# Patient Record
Sex: Male | Born: 1945 | Race: White | Hispanic: No | Marital: Married | Smoking: Never smoker
Health system: Southern US, Community
[De-identification: ages and names within clinical notes are randomized; demographics above are authoritative.]

## PROBLEM LIST (undated history)

## (undated) DIAGNOSIS — E785 Hyperlipidemia, unspecified: Secondary | ICD-10-CM

## (undated) DIAGNOSIS — I2510 Atherosclerotic heart disease of native coronary artery without angina pectoris: Secondary | ICD-10-CM

## (undated) DIAGNOSIS — Z4502 Encounter for adjustment and management of automatic implantable cardiac defibrillator: Secondary | ICD-10-CM

## (undated) DIAGNOSIS — M1990 Unspecified osteoarthritis, unspecified site: Secondary | ICD-10-CM

## (undated) DIAGNOSIS — I255 Ischemic cardiomyopathy: Secondary | ICD-10-CM

## (undated) DIAGNOSIS — S93146A Subluxation of metatarsophalangeal joint of unspecified lesser toe(s), initial encounter: Secondary | ICD-10-CM

## (undated) DIAGNOSIS — S93136A Subluxation of interphalangeal joint of unspecified lesser toe(s), initial encounter: Secondary | ICD-10-CM

## (undated) DIAGNOSIS — I119 Hypertensive heart disease without heart failure: Secondary | ICD-10-CM

## (undated) DIAGNOSIS — I5022 Chronic systolic (congestive) heart failure: Secondary | ICD-10-CM

## (undated) HISTORY — DX: Subluxation of metatarsophalangeal joint of unspecified lesser toe(s), initial encounter: S93.146A

## (undated) HISTORY — DX: Unspecified osteoarthritis, unspecified site: M19.90

## (undated) HISTORY — DX: Encounter for adjustment and management of automatic implantable cardiac defibrillator: Z45.02

## (undated) HISTORY — DX: Hyperlipidemia, unspecified: E78.5

## (undated) HISTORY — PX: TOTAL SHOULDER REPLACEMENT: SUR1217

## (undated) HISTORY — DX: Subluxation of interphalangeal joint of unspecified lesser toe(s), initial encounter: S93.136A

## (undated) HISTORY — PX: TOTAL KNEE ARTHROPLASTY: SHX125

## (undated) HISTORY — DX: Atherosclerotic heart disease of native coronary artery without angina pectoris: I25.10

## (undated) HISTORY — DX: Ischemic cardiomyopathy: I25.5

## (undated) HISTORY — PX: CORONARY ARTERY BYPASS GRAFT: SHX141

## (undated) HISTORY — DX: Chronic systolic (congestive) heart failure: I50.22

## (undated) HISTORY — DX: Hypertensive heart disease without heart failure: I11.9

---

## 2010-08-06 DIAGNOSIS — N390 Urinary tract infection, site not specified: Secondary | ICD-10-CM | POA: Insufficient documentation

## 2010-08-06 DIAGNOSIS — N529 Male erectile dysfunction, unspecified: Secondary | ICD-10-CM | POA: Insufficient documentation

## 2011-03-17 DIAGNOSIS — Z95810 Presence of automatic (implantable) cardiac defibrillator: Secondary | ICD-10-CM | POA: Insufficient documentation

## 2011-03-17 DIAGNOSIS — G4733 Obstructive sleep apnea (adult) (pediatric): Secondary | ICD-10-CM | POA: Insufficient documentation

## 2011-04-07 DIAGNOSIS — M25569 Pain in unspecified knee: Secondary | ICD-10-CM | POA: Insufficient documentation

## 2011-10-19 DIAGNOSIS — H2510 Age-related nuclear cataract, unspecified eye: Secondary | ICD-10-CM | POA: Diagnosis not present

## 2011-10-19 DIAGNOSIS — H04129 Dry eye syndrome of unspecified lacrimal gland: Secondary | ICD-10-CM | POA: Diagnosis not present

## 2011-10-19 DIAGNOSIS — H35039 Hypertensive retinopathy, unspecified eye: Secondary | ICD-10-CM | POA: Diagnosis not present

## 2011-10-19 DIAGNOSIS — D3130 Benign neoplasm of unspecified choroid: Secondary | ICD-10-CM | POA: Diagnosis not present

## 2011-11-20 DIAGNOSIS — Z0189 Encounter for other specified special examinations: Secondary | ICD-10-CM | POA: Diagnosis not present

## 2011-11-20 DIAGNOSIS — I2510 Atherosclerotic heart disease of native coronary artery without angina pectoris: Secondary | ICD-10-CM | POA: Diagnosis not present

## 2012-01-19 DIAGNOSIS — I472 Ventricular tachycardia: Secondary | ICD-10-CM | POA: Diagnosis not present

## 2012-01-19 DIAGNOSIS — I4729 Other ventricular tachycardia: Secondary | ICD-10-CM | POA: Diagnosis not present

## 2012-04-26 DIAGNOSIS — I509 Heart failure, unspecified: Secondary | ICD-10-CM | POA: Diagnosis not present

## 2012-05-17 DIAGNOSIS — Z86018 Personal history of other benign neoplasm: Secondary | ICD-10-CM | POA: Insufficient documentation

## 2012-05-17 DIAGNOSIS — L723 Sebaceous cyst: Secondary | ICD-10-CM | POA: Diagnosis not present

## 2012-05-17 DIAGNOSIS — Z872 Personal history of diseases of the skin and subcutaneous tissue: Secondary | ICD-10-CM | POA: Diagnosis not present

## 2012-05-17 DIAGNOSIS — L821 Other seborrheic keratosis: Secondary | ICD-10-CM | POA: Diagnosis not present

## 2012-05-17 DIAGNOSIS — L578 Other skin changes due to chronic exposure to nonionizing radiation: Secondary | ICD-10-CM | POA: Diagnosis not present

## 2012-06-14 DIAGNOSIS — I2510 Atherosclerotic heart disease of native coronary artery without angina pectoris: Secondary | ICD-10-CM | POA: Diagnosis not present

## 2012-06-14 DIAGNOSIS — N400 Enlarged prostate without lower urinary tract symptoms: Secondary | ICD-10-CM | POA: Diagnosis not present

## 2012-06-14 DIAGNOSIS — I10 Essential (primary) hypertension: Secondary | ICD-10-CM | POA: Diagnosis not present

## 2012-06-14 DIAGNOSIS — E785 Hyperlipidemia, unspecified: Secondary | ICD-10-CM | POA: Diagnosis not present

## 2012-07-27 DIAGNOSIS — I4729 Other ventricular tachycardia: Secondary | ICD-10-CM | POA: Diagnosis not present

## 2012-07-27 DIAGNOSIS — I472 Ventricular tachycardia: Secondary | ICD-10-CM | POA: Diagnosis not present

## 2012-09-19 DIAGNOSIS — Z0189 Encounter for other specified special examinations: Secondary | ICD-10-CM | POA: Diagnosis not present

## 2012-09-19 DIAGNOSIS — E7800 Pure hypercholesterolemia, unspecified: Secondary | ICD-10-CM | POA: Diagnosis not present

## 2012-09-22 DIAGNOSIS — I428 Other cardiomyopathies: Secondary | ICD-10-CM | POA: Diagnosis not present

## 2012-09-22 DIAGNOSIS — I2589 Other forms of chronic ischemic heart disease: Secondary | ICD-10-CM | POA: Diagnosis not present

## 2012-09-22 DIAGNOSIS — I2510 Atherosclerotic heart disease of native coronary artery without angina pectoris: Secondary | ICD-10-CM | POA: Diagnosis not present

## 2012-09-22 DIAGNOSIS — I4729 Other ventricular tachycardia: Secondary | ICD-10-CM | POA: Diagnosis not present

## 2012-09-22 DIAGNOSIS — I472 Ventricular tachycardia: Secondary | ICD-10-CM | POA: Diagnosis not present

## 2012-11-01 DIAGNOSIS — I4729 Other ventricular tachycardia: Secondary | ICD-10-CM | POA: Diagnosis not present

## 2012-11-01 DIAGNOSIS — I472 Ventricular tachycardia: Secondary | ICD-10-CM | POA: Diagnosis not present

## 2012-11-03 DIAGNOSIS — Z23 Encounter for immunization: Secondary | ICD-10-CM | POA: Diagnosis not present

## 2012-12-08 DIAGNOSIS — I2510 Atherosclerotic heart disease of native coronary artery without angina pectoris: Secondary | ICD-10-CM | POA: Diagnosis not present

## 2012-12-08 DIAGNOSIS — E7800 Pure hypercholesterolemia, unspecified: Secondary | ICD-10-CM | POA: Diagnosis not present

## 2012-12-08 DIAGNOSIS — L089 Local infection of the skin and subcutaneous tissue, unspecified: Secondary | ICD-10-CM | POA: Insufficient documentation

## 2012-12-08 DIAGNOSIS — G4733 Obstructive sleep apnea (adult) (pediatric): Secondary | ICD-10-CM | POA: Diagnosis not present

## 2012-12-16 DIAGNOSIS — K612 Anorectal abscess: Secondary | ICD-10-CM | POA: Diagnosis not present

## 2012-12-26 DIAGNOSIS — I2510 Atherosclerotic heart disease of native coronary artery without angina pectoris: Secondary | ICD-10-CM | POA: Diagnosis not present

## 2012-12-26 DIAGNOSIS — M76829 Posterior tibial tendinitis, unspecified leg: Secondary | ICD-10-CM | POA: Diagnosis not present

## 2012-12-26 DIAGNOSIS — E7800 Pure hypercholesterolemia, unspecified: Secondary | ICD-10-CM | POA: Diagnosis not present

## 2012-12-26 DIAGNOSIS — M25579 Pain in unspecified ankle and joints of unspecified foot: Secondary | ICD-10-CM | POA: Diagnosis not present

## 2012-12-26 DIAGNOSIS — G4733 Obstructive sleep apnea (adult) (pediatric): Secondary | ICD-10-CM | POA: Diagnosis not present

## 2013-01-06 DIAGNOSIS — R6889 Other general symptoms and signs: Secondary | ICD-10-CM | POA: Diagnosis not present

## 2013-01-06 DIAGNOSIS — R822 Biliuria: Secondary | ICD-10-CM | POA: Diagnosis not present

## 2013-01-10 DIAGNOSIS — I2510 Atherosclerotic heart disease of native coronary artery without angina pectoris: Secondary | ICD-10-CM | POA: Diagnosis not present

## 2013-01-10 DIAGNOSIS — M76829 Posterior tibial tendinitis, unspecified leg: Secondary | ICD-10-CM | POA: Insufficient documentation

## 2013-01-10 DIAGNOSIS — G4730 Sleep apnea, unspecified: Secondary | ICD-10-CM | POA: Diagnosis not present

## 2013-01-10 DIAGNOSIS — M6789 Other specified disorders of synovium and tendon, multiple sites: Secondary | ICD-10-CM | POA: Diagnosis not present

## 2013-01-12 DIAGNOSIS — M2010 Hallux valgus (acquired), unspecified foot: Secondary | ICD-10-CM | POA: Diagnosis not present

## 2013-01-12 DIAGNOSIS — M25579 Pain in unspecified ankle and joints of unspecified foot: Secondary | ICD-10-CM | POA: Diagnosis not present

## 2013-01-12 DIAGNOSIS — M19079 Primary osteoarthritis, unspecified ankle and foot: Secondary | ICD-10-CM | POA: Diagnosis not present

## 2013-01-27 DIAGNOSIS — I4729 Other ventricular tachycardia: Secondary | ICD-10-CM | POA: Diagnosis not present

## 2013-01-27 DIAGNOSIS — I509 Heart failure, unspecified: Secondary | ICD-10-CM | POA: Diagnosis not present

## 2013-01-27 DIAGNOSIS — I472 Ventricular tachycardia: Secondary | ICD-10-CM | POA: Diagnosis not present

## 2013-02-06 DIAGNOSIS — I472 Ventricular tachycardia: Secondary | ICD-10-CM | POA: Diagnosis not present

## 2013-02-06 DIAGNOSIS — I4729 Other ventricular tachycardia: Secondary | ICD-10-CM | POA: Diagnosis not present

## 2013-02-22 DIAGNOSIS — N390 Urinary tract infection, site not specified: Secondary | ICD-10-CM | POA: Diagnosis not present

## 2013-05-09 DIAGNOSIS — Z872 Personal history of diseases of the skin and subcutaneous tissue: Secondary | ICD-10-CM | POA: Diagnosis not present

## 2013-05-09 DIAGNOSIS — L723 Sebaceous cyst: Secondary | ICD-10-CM | POA: Diagnosis not present

## 2013-05-09 DIAGNOSIS — Z808 Family history of malignant neoplasm of other organs or systems: Secondary | ICD-10-CM | POA: Diagnosis not present

## 2013-05-09 DIAGNOSIS — L821 Other seborrheic keratosis: Secondary | ICD-10-CM | POA: Diagnosis not present

## 2013-05-22 DIAGNOSIS — I4729 Other ventricular tachycardia: Secondary | ICD-10-CM | POA: Diagnosis not present

## 2013-05-22 DIAGNOSIS — R1310 Dysphagia, unspecified: Secondary | ICD-10-CM | POA: Diagnosis not present

## 2013-05-22 DIAGNOSIS — I472 Ventricular tachycardia: Secondary | ICD-10-CM | POA: Diagnosis not present

## 2013-05-30 DIAGNOSIS — R1310 Dysphagia, unspecified: Secondary | ICD-10-CM | POA: Diagnosis not present

## 2013-06-14 DIAGNOSIS — K2100 Gastro-esophageal reflux disease with esophagitis, without bleeding: Secondary | ICD-10-CM | POA: Diagnosis not present

## 2013-06-14 DIAGNOSIS — K2941 Chronic atrophic gastritis with bleeding: Secondary | ICD-10-CM | POA: Diagnosis not present

## 2013-06-14 DIAGNOSIS — K2960 Other gastritis without bleeding: Secondary | ICD-10-CM | POA: Diagnosis not present

## 2013-06-14 DIAGNOSIS — K222 Esophageal obstruction: Secondary | ICD-10-CM | POA: Diagnosis not present

## 2013-06-14 DIAGNOSIS — K209 Esophagitis, unspecified without bleeding: Secondary | ICD-10-CM | POA: Diagnosis not present

## 2013-06-14 DIAGNOSIS — K449 Diaphragmatic hernia without obstruction or gangrene: Secondary | ICD-10-CM | POA: Diagnosis not present

## 2013-06-21 DIAGNOSIS — M25519 Pain in unspecified shoulder: Secondary | ICD-10-CM | POA: Diagnosis not present

## 2013-06-21 DIAGNOSIS — M19019 Primary osteoarthritis, unspecified shoulder: Secondary | ICD-10-CM | POA: Diagnosis not present

## 2013-06-21 DIAGNOSIS — M25419 Effusion, unspecified shoulder: Secondary | ICD-10-CM | POA: Diagnosis not present

## 2013-06-21 DIAGNOSIS — M25569 Pain in unspecified knee: Secondary | ICD-10-CM | POA: Diagnosis not present

## 2013-07-24 DIAGNOSIS — W57XXXA Bitten or stung by nonvenomous insect and other nonvenomous arthropods, initial encounter: Secondary | ICD-10-CM | POA: Diagnosis not present

## 2013-07-24 DIAGNOSIS — T148 Other injury of unspecified body region: Secondary | ICD-10-CM | POA: Diagnosis not present

## 2013-08-30 DIAGNOSIS — I472 Ventricular tachycardia: Secondary | ICD-10-CM | POA: Diagnosis not present

## 2013-08-30 DIAGNOSIS — I4729 Other ventricular tachycardia: Secondary | ICD-10-CM | POA: Diagnosis not present

## 2013-09-11 DIAGNOSIS — M25519 Pain in unspecified shoulder: Secondary | ICD-10-CM | POA: Diagnosis not present

## 2013-09-11 DIAGNOSIS — Z01818 Encounter for other preprocedural examination: Secondary | ICD-10-CM | POA: Diagnosis not present

## 2013-09-11 DIAGNOSIS — Z01810 Encounter for preprocedural cardiovascular examination: Secondary | ICD-10-CM | POA: Diagnosis not present

## 2013-09-11 DIAGNOSIS — M19019 Primary osteoarthritis, unspecified shoulder: Secondary | ICD-10-CM | POA: Diagnosis not present

## 2013-09-11 DIAGNOSIS — I2510 Atherosclerotic heart disease of native coronary artery without angina pectoris: Secondary | ICD-10-CM | POA: Diagnosis not present

## 2013-09-12 DIAGNOSIS — I428 Other cardiomyopathies: Secondary | ICD-10-CM | POA: Diagnosis not present

## 2013-09-12 DIAGNOSIS — Z96619 Presence of unspecified artificial shoulder joint: Secondary | ICD-10-CM | POA: Diagnosis not present

## 2013-09-12 DIAGNOSIS — I2510 Atherosclerotic heart disease of native coronary artery without angina pectoris: Secondary | ICD-10-CM | POA: Diagnosis not present

## 2013-09-12 DIAGNOSIS — Z01810 Encounter for preprocedural cardiovascular examination: Secondary | ICD-10-CM | POA: Diagnosis not present

## 2013-09-14 DIAGNOSIS — E7800 Pure hypercholesterolemia, unspecified: Secondary | ICD-10-CM | POA: Diagnosis not present

## 2013-09-15 DIAGNOSIS — Z01818 Encounter for other preprocedural examination: Secondary | ICD-10-CM | POA: Insufficient documentation

## 2013-09-15 DIAGNOSIS — E785 Hyperlipidemia, unspecified: Secondary | ICD-10-CM | POA: Diagnosis not present

## 2013-09-15 DIAGNOSIS — Z951 Presence of aortocoronary bypass graft: Secondary | ICD-10-CM | POA: Diagnosis not present

## 2013-09-15 DIAGNOSIS — I2510 Atherosclerotic heart disease of native coronary artery without angina pectoris: Secondary | ICD-10-CM | POA: Diagnosis not present

## 2013-09-15 DIAGNOSIS — Z01810 Encounter for preprocedural cardiovascular examination: Secondary | ICD-10-CM | POA: Diagnosis not present

## 2013-09-15 DIAGNOSIS — I428 Other cardiomyopathies: Secondary | ICD-10-CM | POA: Diagnosis not present

## 2013-09-15 DIAGNOSIS — Z95810 Presence of automatic (implantable) cardiac defibrillator: Secondary | ICD-10-CM | POA: Diagnosis not present

## 2013-09-15 DIAGNOSIS — I10 Essential (primary) hypertension: Secondary | ICD-10-CM | POA: Diagnosis not present

## 2013-09-15 DIAGNOSIS — I2119 ST elevation (STEMI) myocardial infarction involving other coronary artery of inferior wall: Secondary | ICD-10-CM | POA: Diagnosis not present

## 2013-12-01 DIAGNOSIS — I4729 Other ventricular tachycardia: Secondary | ICD-10-CM | POA: Diagnosis not present

## 2013-12-01 DIAGNOSIS — I472 Ventricular tachycardia: Secondary | ICD-10-CM | POA: Diagnosis not present

## 2014-01-05 DIAGNOSIS — Z23 Encounter for immunization: Secondary | ICD-10-CM | POA: Diagnosis not present

## 2014-01-12 DIAGNOSIS — M19019 Primary osteoarthritis, unspecified shoulder: Secondary | ICD-10-CM | POA: Diagnosis not present

## 2014-01-12 DIAGNOSIS — Z6830 Body mass index (BMI) 30.0-30.9, adult: Secondary | ICD-10-CM | POA: Diagnosis not present

## 2014-01-12 DIAGNOSIS — I10 Essential (primary) hypertension: Secondary | ICD-10-CM | POA: Diagnosis not present

## 2014-01-12 DIAGNOSIS — Z01810 Encounter for preprocedural cardiovascular examination: Secondary | ICD-10-CM | POA: Diagnosis not present

## 2014-01-12 DIAGNOSIS — E7800 Pure hypercholesterolemia, unspecified: Secondary | ICD-10-CM | POA: Diagnosis not present

## 2014-01-22 DIAGNOSIS — K219 Gastro-esophageal reflux disease without esophagitis: Secondary | ICD-10-CM | POA: Diagnosis present

## 2014-01-22 DIAGNOSIS — M25519 Pain in unspecified shoulder: Secondary | ICD-10-CM | POA: Diagnosis not present

## 2014-01-22 DIAGNOSIS — G8918 Other acute postprocedural pain: Secondary | ICD-10-CM | POA: Diagnosis not present

## 2014-01-22 DIAGNOSIS — R079 Chest pain, unspecified: Secondary | ICD-10-CM | POA: Diagnosis not present

## 2014-01-22 DIAGNOSIS — I2510 Atherosclerotic heart disease of native coronary artery without angina pectoris: Secondary | ICD-10-CM | POA: Diagnosis not present

## 2014-01-22 DIAGNOSIS — Z96619 Presence of unspecified artificial shoulder joint: Secondary | ICD-10-CM | POA: Diagnosis not present

## 2014-01-22 DIAGNOSIS — I252 Old myocardial infarction: Secondary | ICD-10-CM | POA: Diagnosis not present

## 2014-01-22 DIAGNOSIS — Z95810 Presence of automatic (implantable) cardiac defibrillator: Secondary | ICD-10-CM | POA: Diagnosis not present

## 2014-01-22 DIAGNOSIS — M19019 Primary osteoarthritis, unspecified shoulder: Secondary | ICD-10-CM | POA: Diagnosis not present

## 2014-01-22 DIAGNOSIS — Z96659 Presence of unspecified artificial knee joint: Secondary | ICD-10-CM | POA: Diagnosis not present

## 2014-01-22 DIAGNOSIS — E785 Hyperlipidemia, unspecified: Secondary | ICD-10-CM | POA: Diagnosis present

## 2014-01-22 DIAGNOSIS — I5022 Chronic systolic (congestive) heart failure: Secondary | ICD-10-CM | POA: Diagnosis present

## 2014-01-22 DIAGNOSIS — R110 Nausea: Secondary | ICD-10-CM | POA: Diagnosis not present

## 2014-01-22 DIAGNOSIS — Z791 Long term (current) use of non-steroidal anti-inflammatories (NSAID): Secondary | ICD-10-CM | POA: Diagnosis not present

## 2014-01-22 DIAGNOSIS — R7989 Other specified abnormal findings of blood chemistry: Secondary | ICD-10-CM | POA: Diagnosis not present

## 2014-01-22 DIAGNOSIS — E871 Hypo-osmolality and hyponatremia: Secondary | ICD-10-CM | POA: Diagnosis not present

## 2014-01-22 DIAGNOSIS — E669 Obesity, unspecified: Secondary | ICD-10-CM | POA: Diagnosis present

## 2014-01-22 DIAGNOSIS — Z951 Presence of aortocoronary bypass graft: Secondary | ICD-10-CM | POA: Diagnosis not present

## 2014-01-22 DIAGNOSIS — Z471 Aftercare following joint replacement surgery: Secondary | ICD-10-CM | POA: Diagnosis not present

## 2014-01-22 DIAGNOSIS — I10 Essential (primary) hypertension: Secondary | ICD-10-CM | POA: Diagnosis present

## 2014-01-22 DIAGNOSIS — Z7982 Long term (current) use of aspirin: Secondary | ICD-10-CM | POA: Diagnosis not present

## 2014-01-22 DIAGNOSIS — I248 Other forms of acute ischemic heart disease: Secondary | ICD-10-CM | POA: Diagnosis not present

## 2014-01-22 DIAGNOSIS — Z6830 Body mass index (BMI) 30.0-30.9, adult: Secondary | ICD-10-CM | POA: Diagnosis not present

## 2014-01-22 DIAGNOSIS — Z01818 Encounter for other preprocedural examination: Secondary | ICD-10-CM | POA: Diagnosis not present

## 2014-02-19 DIAGNOSIS — IMO0001 Reserved for inherently not codable concepts without codable children: Secondary | ICD-10-CM | POA: Diagnosis not present

## 2014-02-19 DIAGNOSIS — Z96619 Presence of unspecified artificial shoulder joint: Secondary | ICD-10-CM | POA: Diagnosis not present

## 2014-02-19 DIAGNOSIS — M19019 Primary osteoarthritis, unspecified shoulder: Secondary | ICD-10-CM | POA: Diagnosis not present

## 2014-02-20 DIAGNOSIS — N390 Urinary tract infection, site not specified: Secondary | ICD-10-CM | POA: Diagnosis not present

## 2014-02-21 DIAGNOSIS — M19019 Primary osteoarthritis, unspecified shoulder: Secondary | ICD-10-CM | POA: Diagnosis not present

## 2014-02-21 DIAGNOSIS — Z96619 Presence of unspecified artificial shoulder joint: Secondary | ICD-10-CM | POA: Diagnosis not present

## 2014-02-21 DIAGNOSIS — IMO0001 Reserved for inherently not codable concepts without codable children: Secondary | ICD-10-CM | POA: Diagnosis not present

## 2014-02-23 DIAGNOSIS — Z96619 Presence of unspecified artificial shoulder joint: Secondary | ICD-10-CM | POA: Diagnosis not present

## 2014-02-23 DIAGNOSIS — M19019 Primary osteoarthritis, unspecified shoulder: Secondary | ICD-10-CM | POA: Diagnosis not present

## 2014-02-23 DIAGNOSIS — IMO0001 Reserved for inherently not codable concepts without codable children: Secondary | ICD-10-CM | POA: Diagnosis not present

## 2014-02-27 DIAGNOSIS — Z96619 Presence of unspecified artificial shoulder joint: Secondary | ICD-10-CM | POA: Diagnosis not present

## 2014-02-27 DIAGNOSIS — IMO0001 Reserved for inherently not codable concepts without codable children: Secondary | ICD-10-CM | POA: Diagnosis not present

## 2014-02-27 DIAGNOSIS — M19019 Primary osteoarthritis, unspecified shoulder: Secondary | ICD-10-CM | POA: Diagnosis not present

## 2014-02-28 DIAGNOSIS — Z96619 Presence of unspecified artificial shoulder joint: Secondary | ICD-10-CM | POA: Diagnosis not present

## 2014-02-28 DIAGNOSIS — Z471 Aftercare following joint replacement surgery: Secondary | ICD-10-CM | POA: Diagnosis not present

## 2014-02-28 DIAGNOSIS — M19019 Primary osteoarthritis, unspecified shoulder: Secondary | ICD-10-CM | POA: Diagnosis not present

## 2014-03-01 DIAGNOSIS — IMO0001 Reserved for inherently not codable concepts without codable children: Secondary | ICD-10-CM | POA: Diagnosis not present

## 2014-03-01 DIAGNOSIS — M19019 Primary osteoarthritis, unspecified shoulder: Secondary | ICD-10-CM | POA: Diagnosis not present

## 2014-03-01 DIAGNOSIS — Z96619 Presence of unspecified artificial shoulder joint: Secondary | ICD-10-CM | POA: Diagnosis not present

## 2014-03-02 DIAGNOSIS — M19019 Primary osteoarthritis, unspecified shoulder: Secondary | ICD-10-CM | POA: Diagnosis not present

## 2014-03-02 DIAGNOSIS — IMO0001 Reserved for inherently not codable concepts without codable children: Secondary | ICD-10-CM | POA: Diagnosis not present

## 2014-03-02 DIAGNOSIS — Z96619 Presence of unspecified artificial shoulder joint: Secondary | ICD-10-CM | POA: Diagnosis not present

## 2014-03-06 DIAGNOSIS — IMO0001 Reserved for inherently not codable concepts without codable children: Secondary | ICD-10-CM | POA: Diagnosis not present

## 2014-03-06 DIAGNOSIS — M19019 Primary osteoarthritis, unspecified shoulder: Secondary | ICD-10-CM | POA: Diagnosis not present

## 2014-03-06 DIAGNOSIS — Z96619 Presence of unspecified artificial shoulder joint: Secondary | ICD-10-CM | POA: Diagnosis not present

## 2014-03-07 DIAGNOSIS — Z96619 Presence of unspecified artificial shoulder joint: Secondary | ICD-10-CM | POA: Diagnosis not present

## 2014-03-07 DIAGNOSIS — M19019 Primary osteoarthritis, unspecified shoulder: Secondary | ICD-10-CM | POA: Diagnosis not present

## 2014-03-07 DIAGNOSIS — IMO0001 Reserved for inherently not codable concepts without codable children: Secondary | ICD-10-CM | POA: Diagnosis not present

## 2014-03-08 DIAGNOSIS — Z96619 Presence of unspecified artificial shoulder joint: Secondary | ICD-10-CM | POA: Diagnosis not present

## 2014-03-08 DIAGNOSIS — IMO0001 Reserved for inherently not codable concepts without codable children: Secondary | ICD-10-CM | POA: Diagnosis not present

## 2014-03-08 DIAGNOSIS — M19019 Primary osteoarthritis, unspecified shoulder: Secondary | ICD-10-CM | POA: Diagnosis not present

## 2014-03-12 DIAGNOSIS — I4729 Other ventricular tachycardia: Secondary | ICD-10-CM | POA: Diagnosis not present

## 2014-03-12 DIAGNOSIS — I472 Ventricular tachycardia: Secondary | ICD-10-CM | POA: Diagnosis not present

## 2014-03-20 DIAGNOSIS — Z96619 Presence of unspecified artificial shoulder joint: Secondary | ICD-10-CM | POA: Diagnosis not present

## 2014-03-20 DIAGNOSIS — M19019 Primary osteoarthritis, unspecified shoulder: Secondary | ICD-10-CM | POA: Diagnosis not present

## 2014-03-20 DIAGNOSIS — IMO0001 Reserved for inherently not codable concepts without codable children: Secondary | ICD-10-CM | POA: Diagnosis not present

## 2014-03-22 DIAGNOSIS — IMO0001 Reserved for inherently not codable concepts without codable children: Secondary | ICD-10-CM | POA: Diagnosis not present

## 2014-03-22 DIAGNOSIS — M19019 Primary osteoarthritis, unspecified shoulder: Secondary | ICD-10-CM | POA: Diagnosis not present

## 2014-03-22 DIAGNOSIS — Z96619 Presence of unspecified artificial shoulder joint: Secondary | ICD-10-CM | POA: Diagnosis not present

## 2014-03-26 DIAGNOSIS — IMO0001 Reserved for inherently not codable concepts without codable children: Secondary | ICD-10-CM | POA: Diagnosis not present

## 2014-03-26 DIAGNOSIS — Z96619 Presence of unspecified artificial shoulder joint: Secondary | ICD-10-CM | POA: Diagnosis not present

## 2014-03-26 DIAGNOSIS — M19019 Primary osteoarthritis, unspecified shoulder: Secondary | ICD-10-CM | POA: Diagnosis not present

## 2014-03-27 DIAGNOSIS — J069 Acute upper respiratory infection, unspecified: Secondary | ICD-10-CM | POA: Diagnosis not present

## 2014-03-28 DIAGNOSIS — Z96619 Presence of unspecified artificial shoulder joint: Secondary | ICD-10-CM | POA: Diagnosis not present

## 2014-03-28 DIAGNOSIS — M19019 Primary osteoarthritis, unspecified shoulder: Secondary | ICD-10-CM | POA: Diagnosis not present

## 2014-03-28 DIAGNOSIS — IMO0001 Reserved for inherently not codable concepts without codable children: Secondary | ICD-10-CM | POA: Diagnosis not present

## 2014-03-30 DIAGNOSIS — IMO0001 Reserved for inherently not codable concepts without codable children: Secondary | ICD-10-CM | POA: Diagnosis not present

## 2014-03-30 DIAGNOSIS — M19019 Primary osteoarthritis, unspecified shoulder: Secondary | ICD-10-CM | POA: Diagnosis not present

## 2014-03-30 DIAGNOSIS — Z96619 Presence of unspecified artificial shoulder joint: Secondary | ICD-10-CM | POA: Diagnosis not present

## 2014-04-02 DIAGNOSIS — M19019 Primary osteoarthritis, unspecified shoulder: Secondary | ICD-10-CM | POA: Diagnosis not present

## 2014-04-02 DIAGNOSIS — IMO0001 Reserved for inherently not codable concepts without codable children: Secondary | ICD-10-CM | POA: Diagnosis not present

## 2014-04-02 DIAGNOSIS — Z96619 Presence of unspecified artificial shoulder joint: Secondary | ICD-10-CM | POA: Diagnosis not present

## 2014-04-09 DIAGNOSIS — Z96619 Presence of unspecified artificial shoulder joint: Secondary | ICD-10-CM | POA: Diagnosis not present

## 2014-04-09 DIAGNOSIS — IMO0001 Reserved for inherently not codable concepts without codable children: Secondary | ICD-10-CM | POA: Diagnosis not present

## 2014-04-09 DIAGNOSIS — M19019 Primary osteoarthritis, unspecified shoulder: Secondary | ICD-10-CM | POA: Diagnosis not present

## 2014-04-11 DIAGNOSIS — Z96619 Presence of unspecified artificial shoulder joint: Secondary | ICD-10-CM | POA: Diagnosis not present

## 2014-04-11 DIAGNOSIS — IMO0001 Reserved for inherently not codable concepts without codable children: Secondary | ICD-10-CM | POA: Diagnosis not present

## 2014-04-11 DIAGNOSIS — M19019 Primary osteoarthritis, unspecified shoulder: Secondary | ICD-10-CM | POA: Diagnosis not present

## 2014-04-16 DIAGNOSIS — IMO0001 Reserved for inherently not codable concepts without codable children: Secondary | ICD-10-CM | POA: Diagnosis not present

## 2014-04-16 DIAGNOSIS — M19019 Primary osteoarthritis, unspecified shoulder: Secondary | ICD-10-CM | POA: Diagnosis not present

## 2014-04-16 DIAGNOSIS — Z96619 Presence of unspecified artificial shoulder joint: Secondary | ICD-10-CM | POA: Diagnosis not present

## 2014-04-18 DIAGNOSIS — Z96619 Presence of unspecified artificial shoulder joint: Secondary | ICD-10-CM | POA: Diagnosis not present

## 2014-04-18 DIAGNOSIS — IMO0001 Reserved for inherently not codable concepts without codable children: Secondary | ICD-10-CM | POA: Diagnosis not present

## 2014-04-18 DIAGNOSIS — M19019 Primary osteoarthritis, unspecified shoulder: Secondary | ICD-10-CM | POA: Diagnosis not present

## 2014-04-19 DIAGNOSIS — D492 Neoplasm of unspecified behavior of bone, soft tissue, and skin: Secondary | ICD-10-CM | POA: Diagnosis not present

## 2014-04-19 DIAGNOSIS — L821 Other seborrheic keratosis: Secondary | ICD-10-CM | POA: Diagnosis not present

## 2014-04-19 DIAGNOSIS — L723 Sebaceous cyst: Secondary | ICD-10-CM | POA: Diagnosis not present

## 2014-04-19 DIAGNOSIS — D235 Other benign neoplasm of skin of trunk: Secondary | ICD-10-CM | POA: Diagnosis not present

## 2014-04-19 DIAGNOSIS — Z872 Personal history of diseases of the skin and subcutaneous tissue: Secondary | ICD-10-CM | POA: Diagnosis not present

## 2014-04-20 DIAGNOSIS — M25579 Pain in unspecified ankle and joints of unspecified foot: Secondary | ICD-10-CM | POA: Diagnosis not present

## 2014-04-20 DIAGNOSIS — M76829 Posterior tibial tendinitis, unspecified leg: Secondary | ICD-10-CM | POA: Diagnosis not present

## 2014-05-14 DIAGNOSIS — Z9660 Presence of unspecified orthopedic joint implant: Secondary | ICD-10-CM | POA: Diagnosis not present

## 2014-05-14 DIAGNOSIS — M19019 Primary osteoarthritis, unspecified shoulder: Secondary | ICD-10-CM | POA: Diagnosis not present

## 2014-05-24 DIAGNOSIS — L0292 Furuncle, unspecified: Secondary | ICD-10-CM | POA: Diagnosis not present

## 2014-05-24 DIAGNOSIS — L0293 Carbuncle, unspecified: Secondary | ICD-10-CM | POA: Diagnosis not present

## 2014-06-06 DIAGNOSIS — I2584 Coronary atherosclerosis due to calcified coronary lesion: Secondary | ICD-10-CM | POA: Diagnosis not present

## 2014-06-06 DIAGNOSIS — I2510 Atherosclerotic heart disease of native coronary artery without angina pectoris: Secondary | ICD-10-CM | POA: Diagnosis not present

## 2014-06-06 DIAGNOSIS — G4730 Sleep apnea, unspecified: Secondary | ICD-10-CM | POA: Diagnosis not present

## 2014-06-15 DIAGNOSIS — E7800 Pure hypercholesterolemia, unspecified: Secondary | ICD-10-CM | POA: Diagnosis not present

## 2014-07-02 DIAGNOSIS — I4729 Other ventricular tachycardia: Secondary | ICD-10-CM | POA: Diagnosis not present

## 2014-07-02 DIAGNOSIS — I472 Ventricular tachycardia: Secondary | ICD-10-CM | POA: Diagnosis not present

## 2014-10-24 DIAGNOSIS — I472 Ventricular tachycardia: Secondary | ICD-10-CM | POA: Diagnosis not present

## 2015-01-15 DIAGNOSIS — E780 Pure hypercholesterolemia: Secondary | ICD-10-CM | POA: Diagnosis not present

## 2015-01-15 DIAGNOSIS — I2510 Atherosclerotic heart disease of native coronary artery without angina pectoris: Secondary | ICD-10-CM | POA: Diagnosis not present

## 2015-01-15 DIAGNOSIS — M1990 Unspecified osteoarthritis, unspecified site: Secondary | ICD-10-CM | POA: Diagnosis not present

## 2015-01-15 DIAGNOSIS — I10 Essential (primary) hypertension: Secondary | ICD-10-CM | POA: Diagnosis not present

## 2015-01-15 DIAGNOSIS — Z95810 Presence of automatic (implantable) cardiac defibrillator: Secondary | ICD-10-CM | POA: Diagnosis not present

## 2015-01-21 DIAGNOSIS — I472 Ventricular tachycardia: Secondary | ICD-10-CM | POA: Diagnosis not present

## 2015-01-24 ENCOUNTER — Telehealth: Payer: Self-pay | Admitting: Cardiology

## 2015-01-24 NOTE — Telephone Encounter (Signed)
Received records from [place] for appointment with Dr Percival Spanish on 02/28/15.  Records given to [facility] (medical records) for Dr Hochrein's schedule on 5/191/6. lp

## 2015-02-28 ENCOUNTER — Encounter: Payer: Self-pay | Admitting: Cardiology

## 2015-02-28 ENCOUNTER — Ambulatory Visit (INDEPENDENT_AMBULATORY_CARE_PROVIDER_SITE_OTHER): Payer: Medicare Other | Admitting: Cardiology

## 2015-02-28 VITALS — BP 130/86 | HR 61 | Ht 70.0 in | Wt 216.0 lb

## 2015-02-28 DIAGNOSIS — I255 Ischemic cardiomyopathy: Secondary | ICD-10-CM | POA: Diagnosis not present

## 2015-02-28 DIAGNOSIS — Z951 Presence of aortocoronary bypass graft: Secondary | ICD-10-CM | POA: Insufficient documentation

## 2015-02-28 DIAGNOSIS — E878 Other disorders of electrolyte and fluid balance, not elsewhere classified: Secondary | ICD-10-CM

## 2015-02-28 DIAGNOSIS — I2510 Atherosclerotic heart disease of native coronary artery without angina pectoris: Secondary | ICD-10-CM

## 2015-02-28 DIAGNOSIS — Z950 Presence of cardiac pacemaker: Secondary | ICD-10-CM | POA: Diagnosis not present

## 2015-02-28 DIAGNOSIS — E785 Hyperlipidemia, unspecified: Secondary | ICD-10-CM

## 2015-02-28 NOTE — Progress Notes (Signed)
Cardiology Office Note   Date:  02/28/2015   ID:  Suliman Termini, DOB 1946-09-19, MRN [id number]  PCP:  Henrine Screws, MD  Cardiologist:   Minus Breeding, MD   Chief Complaint  Patient presents with  . Coronary Artery Disease  . Cardiomyopathy      History of Present Illness: Stephen Adams is a 69 y.o. male who presents for evaluation of ischemic cardiomyopathy and CAD.  The patient had a myocardial infarction several years ago at the time of left shoulder surgery. Because he was just post surgery he did not get angioplasty PCI. He had a resultant ischemic cardiomyopathy. He did later get bypass and then an ICD. I don't have all of these records. He is moving down here most of the time. He has some horses and is retired down here although he still has a home in [place] and will go there for a few months of the year.  He does report that he had a stress perfusion study in December 2014 prior to a second shoulder surgery. He denies any ongoing cardiovascular symptoms. He is very active on his property. With all of this activity he denies any cardiovascular symptoms.  The patient denies any new symptoms such as chest discomfort, neck or arm discomfort. There has been no new shortness of breath, PND or orthopnea. There have been no reported palpitations, presyncope or syncope.   Past Medical History  Diagnosis Date  . HTN (hypertension)   . Hyperlipidemia   . DJD (degenerative joint disease)   . Ischemic cardiomyopathy   . ICD (implantable cardioverter-defibrillator) battery depletion     Medtronic Virtuoso VR     Past Surgical History  Procedure Laterality Date  . Total shoulder replacement Bilateral   . Total knee arthroplasty Left   . Coronary artery bypass graft      4 vessel [facility].  Dr. Burns Spain 06/2000     Current Outpatient Prescriptions  Medication Sig Dispense Refill  . aspirin 325 MG EC tablet Take 325 mg by mouth daily.    . carvedilol (COREG) 6.25  MG tablet Take 6.25 mg by mouth 2 (two) times daily with a meal.    . irbesartan-hydrochlorothiazide (AVALIDE) 150-12.5 MG per tablet Take 1 tablet by mouth daily.    Marland Kitchen lovastatin (MEVACOR) 20 MG tablet Take 20 mg by mouth at bedtime.    . niacin 500 MG tablet Take 500 mg by mouth at bedtime.     No current facility-administered medications for this visit.    Allergies:   Review of patient's allergies indicates not on file.    Social History:  The patient  reports that he has never smoked. He does not have any smokeless tobacco history on file.   Family History:  The patient's family history includes CAD in his mother; Cancer in his mother; Hypertension in his father.    ROS:  Please see the history of present illness.   Otherwise, review of systems are positive for reflux and allergies.   All other systems are reviewed and negative.    PHYSICAL EXAM: VS:  BP 130/86 mmHg  Pulse 61  Ht 5\' 10"  (1.778 m)  Wt 216 lb (97.977 kg)  BMI 30.99 kg/m2 , BMI Body mass index is 30.99 kg/(m^2). GENERAL:  Well appearing HEENT:  Pupils equal round and reactive, fundi not visualized, oral mucosa unremarkable NECK:  No jugular venous distention, waveform within normal limits, carotid upstroke brisk and symmetric, no bruits, no thyromegaly  LYMPHATICS:  No cervical, inguinal adenopathy LUNGS:  Clear to auscultation bilaterally BACK:  No CVA tenderness CHEST:  ICD pocket HEART:  PMI not displaced or sustained,S1 and S2 within normal limits, no S3, no S4, no clicks, no rubs, no murmurs ABD:  Flat, positive bowel sounds normal in frequency in pitch, no bruits, no rebound, no guarding, no midline pulsatile mass, no hepatomegaly, no splenomegaly EXT:  2 plus pulses throughout, no edema, no cyanosis no clubbing SKIN:  No rashes no nodules NEURO:  Cranial nerves II through XII grossly intact, motor grossly intact throughout PSYCH:  Cognitively intact, oriented to person place and time    EKG:  EKG is  ordered today. The ekg ordered today demonstrates sinus rhythm, first-degree AV block, rate 61, old anteroseptal infarct, inferior infarct, interventricular conduction delay, probable ventricular hypertrophy with repolarization changes. No old EKG for comparison.      Wt Readings from Last 3 Encounters:  02/28/15 216 lb (97.977 kg)      Other studies Reviewed: Additional studies/ records that were reviewed today include: Outside records.. Review of the above records demonstrates:  Please see elsewhere in the note.     ASSESSMENT AND PLAN:  CAD:  The patient has no ongoing symptoms.  I would like to review the most recent cardiac work up.  For now he will continue his current meds. No further testing is planned.  ISCHEMIC CM:  He seems to be euvolemic. I will order an echocardiogram. In the future I might titrate his ARB or add spironolactone.  ICD:  I will arrange follow-up in the ICD clinic.  HTN:  This is being managed in the context of treating his CHF  DYSLIPIDEMIA:  He can stop his niacin per recent FDA recommendations.     Current medicines are reviewed at length with the patient today.  The patient has concerns regarding medicines.  The following changes have been made:  As above  Labs/ tests ordered today include:     Orders Placed This Encounter  Procedures  . Lipid panel  . Basic metabolic panel  . EKG 12-Lead  . ECHOCARDIOGRAM COMPLETE     Disposition:   FU with me in 4 months.  New EP appt in 2 months.    Signed, Minus Breeding, MD  02/28/2015 3:58 PM    [facility]

## 2015-02-28 NOTE — Patient Instructions (Signed)
Your physician recommends that you schedule a follow-up appointment in: 4 months with Dr. Allena Napoleon will see the EP/Pacemaker rep. In two months  Stop taking your niacin  We are ordering for you to have an echo and have labs done

## 2015-03-04 ENCOUNTER — Ambulatory Visit (HOSPITAL_COMMUNITY)
Admission: RE | Admit: 2015-03-04 | Discharge: 2015-03-04 | Disposition: A | Discharge status: Home / Self Care | Payer: Medicare Other | Source: Ambulatory Visit | Attending: Internal Medicine | Admitting: Internal Medicine

## 2015-03-04 DIAGNOSIS — I255 Ischemic cardiomyopathy: Secondary | ICD-10-CM

## 2015-03-04 DIAGNOSIS — I351 Nonrheumatic aortic (valve) insufficiency: Secondary | ICD-10-CM | POA: Diagnosis not present

## 2015-03-04 DIAGNOSIS — E878 Other disorders of electrolyte and fluid balance, not elsewhere classified: Secondary | ICD-10-CM | POA: Diagnosis not present

## 2015-03-04 DIAGNOSIS — E785 Hyperlipidemia, unspecified: Secondary | ICD-10-CM | POA: Diagnosis not present

## 2015-03-04 LAB — LIPID PANEL
CHOL/HDL RATIO: 5.3 ratio
Cholesterol: 138 mg/dL (ref 0–200)
HDL: 26 mg/dL — AB (ref >=40)
LDL Cholesterol: 93 mg/dL (ref 0–99)
Triglycerides: 96 mg/dL (ref <150)
VLDL: 19 mg/dL (ref 0–40)

## 2015-03-04 LAB — BASIC METABOLIC PANEL
BUN: 16 mg/dL (ref 6–23)
CALCIUM: 9.1 mg/dL (ref 8.4–10.5)
CHLORIDE: 100 meq/L (ref 96–112)
CO2: 28 mEq/L (ref 19–32)
Creat: 0.85 mg/dL (ref 0.50–1.35)
GLUCOSE: 102 mg/dL — AB (ref 70–99)
Potassium: 4.5 mEq/L (ref 3.5–5.3)
Sodium: 137 mEq/L (ref 135–145)

## 2015-04-23 DIAGNOSIS — L720 Epidermal cyst: Secondary | ICD-10-CM | POA: Diagnosis not present

## 2015-04-23 DIAGNOSIS — L821 Other seborrheic keratosis: Secondary | ICD-10-CM | POA: Diagnosis not present

## 2015-04-23 DIAGNOSIS — Z872 Personal history of diseases of the skin and subcutaneous tissue: Secondary | ICD-10-CM | POA: Diagnosis not present

## 2015-04-25 ENCOUNTER — Telehealth: Payer: Self-pay | Admitting: *Deleted

## 2015-04-25 NOTE — Telephone Encounter (Signed)
UNABLE TO REACH PT TO GET FAMILY STATUS.

## 2015-05-01 ENCOUNTER — Ambulatory Visit (INDEPENDENT_AMBULATORY_CARE_PROVIDER_SITE_OTHER): Payer: Medicare Other | Admitting: Internal Medicine

## 2015-05-01 ENCOUNTER — Encounter: Payer: Self-pay | Admitting: Internal Medicine

## 2015-05-01 VITALS — BP 120/84 | HR 52 | Ht 70.0 in | Wt 218.4 lb

## 2015-05-01 DIAGNOSIS — I255 Ischemic cardiomyopathy: Secondary | ICD-10-CM

## 2015-05-01 DIAGNOSIS — Z95810 Presence of automatic (implantable) cardiac defibrillator: Secondary | ICD-10-CM | POA: Diagnosis not present

## 2015-05-01 LAB — CUP PACEART INCLINIC DEVICE CHECK
Date Time Interrogation Session: 20160720111617
HIGH POWER IMPEDANCE MEASURED VALUE: 50 Ohm
HighPow Impedance: 41 Ohm
Lead Channel Impedance Value: 384 Ohm
Lead Channel Pacing Threshold Amplitude: 1.5 V
Lead Channel Pacing Threshold Pulse Width: 0.4 ms
Lead Channel Sensing Intrinsic Amplitude: 15.9123
Lead Channel Setting Pacing Pulse Width: 0.4 ms
Lead Channel Setting Sensing Sensitivity: 0.3 mV
MDC IDC MSMT BATTERY VOLTAGE: 2.71 V
MDC IDC SET LEADCHNL RV PACING AMPLITUDE: 3 V
MDC IDC SET ZONE DETECTION INTERVAL: 400 ms
MDC IDC STAT BRADY RV PERCENT PACED: 7.2 %
Zone Setting Detection Interval: 320 ms
Zone Setting Detection Interval: 400 ms

## 2015-05-01 NOTE — Assessment & Plan Note (Signed)
His Medtronic single-chamber ICD is working normally. He is approaching elective replacement, but certainly not there yet. We'll see him back in several months.

## 2015-05-01 NOTE — Patient Instructions (Addendum)
Medication Instructions:  Your physician recommends that you continue on your current medications as directed. Please refer to the Current Medication list given to you today.   Labwork:   Testing/Procedures:   Follow-Up:   Remote monitoring is used to monitor your Pacemaker of ICD from home. This monitoring reduces the number of office visits required to check your device to one time per year. It allows [place] to keep an eye on the functioning of your device to ensure it is working properly. You are scheduled for a device check from home 07/31/15 You may send your transmission at any time that day. If you have a wireless device, the transmission will be sent automatically. After your physician reviews your transmission, you will receive a postcard with your next transmission date.   Your physician wants you to follow-up in:  In one year with dr Lovena Le Dennis Bast will receive a reminder letter in the mail two months in advance. If you don't receive a letter, please call our office to schedule the follow-up appointment.'   Any Other Special Instructions Will Be Listed Below (If Applicable).

## 2015-05-01 NOTE — Progress Notes (Signed)
      HPI Stephen Adams is referred today for ongoing management of his ICD. He is a very pleasant 69 year old man with a long-standing history of ischemic heart disease, status post MI initially at age 55. He had undergone bypass surgery approximately 14 years ago. Prior to that he had had multiple coronary stents placed. The patient has never had syncope and denies any prior ICD shocks. No history of arrhythmia. 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 He spends his time between [facility] and [place]. No Known Allergies   Current Outpatient Prescriptions  Medication Sig Dispense Refill  . aspirin 325 MG EC tablet Take 325 mg by mouth daily.    . carvedilol (COREG) 6.25 MG tablet Take 6.25 mg by mouth 2 (two) times daily with a meal.    . irbesartan-hydrochlorothiazide (AVALIDE) 150-12.5 MG per tablet Take 1 tablet by mouth daily.    Marland Kitchen lovastatin (MEVACOR) 20 MG tablet Take 20 mg by mouth at bedtime.     No current facility-administered medications for this visit.     Past Medical History  Diagnosis Date  . HTN (hypertension)   . Hyperlipidemia   . DJD (degenerative joint disease)   . Ischemic cardiomyopathy   . ICD (implantable cardioverter-defibrillator) battery depletion     Medtronic Virtuoso VR     ROS:   All systems reviewed and negative except as noted in the HPI.   Past Surgical History  Procedure Laterality Date  . Total shoulder replacement Bilateral   . Total knee arthroplasty Left   . Coronary artery bypass graft      4 vessel [facility].  Dr. [name] 06/2000     Family History  Problem Relation Age of Onset  . Hypertension Father   . Heart Problems Father     bypass 1992  . CAD Mother   . Cancer Mother     Pancreatic  . Heart Problems Maternal Grandfather      History   Social History  . Marital Status: Married    Spouse Name: N/A  . Number of Children: 4  . Years of Education: N/A    Occupational History  . Beef Cattle Farmer    Social History Main Topics  . Smoking status: Never Smoker   . Smokeless tobacco: Not on file  . Alcohol Use: Not on file  . Drug Use: Not on file  . Sexual Activity: Not on file   Other Topics Concern  . Not on file   Social History Narrative   Lives with wife and pony and draft horses.       BP 120/84 mmHg  Pulse 52  Ht 5\' 10"  (1.778 m)  Wt 218 lb 6.4 oz (99.066 kg)  BMI 31.34 kg/m2  Physical Exam:  Well appearing 69 year old man, NAD HEENT: Unremarkable Neck:  No JVD, no thyromegally Back:  No CVA tenderness Lungs:  Clear with no wheezes, rales, or rhonchi. HEART:  Regular rate rhythm, no murmurs, no rubs, no clicks Abd:  soft, positive bowel sounds, no organomegally, no rebound, no guarding Ext:  2 plus pulses, no edema, no cyanosis, no clubbing Skin:  No rashes no nodules Neuro:  CN II through XII intact, motor grossly intact   DEVICE  Normal device function.  See PaceArt for details.   Assess/Plan:

## 2015-05-27 DIAGNOSIS — N138 Other obstructive and reflux uropathy: Secondary | ICD-10-CM | POA: Diagnosis not present

## 2015-05-27 DIAGNOSIS — Z125 Encounter for screening for malignant neoplasm of prostate: Secondary | ICD-10-CM | POA: Diagnosis not present

## 2015-05-27 DIAGNOSIS — N390 Urinary tract infection, site not specified: Secondary | ICD-10-CM | POA: Diagnosis not present

## 2015-05-27 DIAGNOSIS — N401 Enlarged prostate with lower urinary tract symptoms: Secondary | ICD-10-CM | POA: Diagnosis not present

## 2015-05-28 DIAGNOSIS — G4733 Obstructive sleep apnea (adult) (pediatric): Secondary | ICD-10-CM | POA: Diagnosis not present

## 2015-07-31 ENCOUNTER — Encounter: Payer: Medicare Other | Admitting: *Deleted

## 2015-08-01 ENCOUNTER — Encounter: Payer: Self-pay | Admitting: Cardiology

## 2015-08-01 ENCOUNTER — Telehealth: Payer: Self-pay | Admitting: Internal Medicine

## 2015-08-01 NOTE — Telephone Encounter (Signed)
New problem    Pt having problems hooking up his new device and need to speak to someone. Please call pt.

## 2015-08-01 NOTE — Telephone Encounter (Signed)
Spoke w/ pt wife and pt still can not get any cellular signal on his wirex adapter. Pt wife was using a cell phon while we was talking. instructed pt wife to call tech services to see if they could help trouble shooting. Pt wife verbalized understanding.

## 2015-08-02 ENCOUNTER — Ambulatory Visit (INDEPENDENT_AMBULATORY_CARE_PROVIDER_SITE_OTHER): Payer: Medicare Other | Admitting: *Deleted

## 2015-08-02 DIAGNOSIS — I255 Ischemic cardiomyopathy: Secondary | ICD-10-CM

## 2015-08-06 LAB — CUP PACEART REMOTE DEVICE CHECK
HighPow Impedance: 42 Ohm
HighPow Impedance: 53 Ohm
Implantable Lead Location: 753860
Lead Channel Impedance Value: 392 Ohm
Lead Channel Sensing Intrinsic Amplitude: 14.897 mV
Lead Channel Setting Pacing Amplitude: 3 V
MDC IDC LEAD IMPLANT DT: 20100104
MDC IDC MSMT BATTERY VOLTAGE: 2.65 V
MDC IDC SESS DTM: 20161021135627
MDC IDC SET LEADCHNL RV PACING PULSEWIDTH: 0.4 ms
MDC IDC SET LEADCHNL RV SENSING SENSITIVITY: 0.3 mV
MDC IDC STAT BRADY RV PERCENT PACED: 13.44 %

## 2015-08-06 NOTE — Progress Notes (Signed)
Remote ICD transmission.   

## 2015-08-07 ENCOUNTER — Encounter: Payer: Self-pay | Admitting: Cardiology

## 2015-08-08 ENCOUNTER — Ambulatory Visit (INDEPENDENT_AMBULATORY_CARE_PROVIDER_SITE_OTHER): Payer: Medicare Other | Admitting: Cardiology

## 2015-08-08 ENCOUNTER — Encounter: Payer: Self-pay | Admitting: *Deleted

## 2015-08-08 VITALS — BP 140/70 | HR 50 | Ht 70.0 in | Wt 220.0 lb

## 2015-08-08 DIAGNOSIS — I255 Ischemic cardiomyopathy: Secondary | ICD-10-CM

## 2015-08-08 DIAGNOSIS — Z79899 Other long term (current) drug therapy: Secondary | ICD-10-CM

## 2015-08-08 DIAGNOSIS — I2510 Atherosclerotic heart disease of native coronary artery without angina pectoris: Secondary | ICD-10-CM | POA: Diagnosis not present

## 2015-08-08 MED ORDER — SPIRONOLACTONE 25 MG PO TABS: 25.0000 mg | ORAL_TABLET | Freq: Every day | ORAL | Status: DC

## 2015-08-08 NOTE — Progress Notes (Signed)
Cardiology Office Note   Date:  08/08/2015   ID:  Stephen Adams, DOB 03-13-1946, MRN [id number]  PCP:  Henrine Screws, MD  Cardiologist:   Minus Breeding, MD   Chief Complaint  Patient presents with  . Cardiomyopathy      History of Present Illness: Stephen Adams is a 69 y.o. male who presents for evaluation of ischemic cardiomyopathy and CAD.  The patient had a myocardial infarction several years ago at the time of left shoulder surgery. Because he was just post surgery he did not get angioplasty. He had a resultant ischemic cardiomyopathy. He did later get bypass and then an ICD. I still don't have all of these records. He lives half the year here and half in [place].  He says that he is doing well.  The patient denies any new symptoms such as chest discomfort, neck or arm discomfort. There has been no new shortness of breath, PND or orthopnea. There have been no reported palpitations, presyncope or syncope.  He did have an EF of 25% on echo which he says is his baseline.     Past Medical History  Diagnosis Date  . HTN (hypertension)   . Hyperlipidemia   . DJD (degenerative joint disease)   . Ischemic cardiomyopathy   . ICD (implantable cardioverter-defibrillator) battery depletion     Medtronic Virtuoso VR     Past Surgical History  Procedure Laterality Date  . Total shoulder replacement Bilateral   . Total knee arthroplasty Left   . Coronary artery bypass graft      4 vessel [facility].  Dr. Burns Spain 06/2000     Current Outpatient Prescriptions  Medication Sig Dispense Refill  . aspirin 81 MG tablet Take 81 mg by mouth daily.    . carvedilol (COREG) 6.25 MG tablet Take 6.25 mg by mouth 2 (two) times daily with a meal.    . irbesartan-hydrochlorothiazide (AVALIDE) 150-12.5 MG per tablet Take 1 tablet by mouth daily.    Marland Kitchen lovastatin (MEVACOR) 20 MG tablet Take 20 mg by mouth at bedtime.    . pantoprazole (PROTONIX) 40 MG tablet Take 40 mg by mouth daily.    Marland Kitchen  spironolactone (ALDACTONE) 25 MG tablet Take 1 tablet (25 mg total) by mouth daily. 30 tablet 6   No current facility-administered medications for this visit.    Allergies:   Review of patient's allergies indicates no known allergies.    ROS:  Please see the history of present illness.   Otherwise, review of systems are positive for reflux and allergies.   All other systems are reviewed and negative.    PHYSICAL EXAM: VS:  BP 140/70 mmHg  Pulse 50  Ht 5\' 10"  (1.778 m)  Wt 220 lb (99.791 kg)  BMI 31.57 kg/m2 , BMI Body mass index is 31.57 kg/(m^2). GENERAL:  Well appearing  NECK:  No jugular venous distention, waveform within normal limits, carotid upstroke brisk and symmetric, no bruits, no thyromegaly LUNGS:  Clear to auscultation bilaterally BACK:  No CVA tenderness CHEST:  ICD pocket HEART:  PMI not displaced or sustained,S1 and S2 within normal limits, no S3, no S4, no clicks, no rubs, no murmurs ABD:  Flat, positive bowel sounds normal in frequency in pitch, no bruits, no rebound, no guarding, no midline pulsatile mass, no hepatomegaly, no splenomegaly EXT:  2 plus pulses throughout, no edema, no cyanosis no clubbing   EKG:  EKG is ordered today. The ekg ordered today demonstrates sinus rhythm, first-degree AV  block, rate 50, old anteroseptal infarct, inferior infarct, interventricular conduction delay, probable ventricular hypertrophy with repolarization changes. No old EKG for comparison.      Wt Readings from Last 3 Encounters:  08/08/15 220 lb (99.791 kg)  05/01/15 218 lb 6.4 oz (99.066 kg)  02/28/15 216 lb (97.977 kg)      Other studies Reviewed: Additional studies/ records that were reviewed today include: Outside records.. Review of the above records demonstrates:  Please see elsewhere in the note.     ASSESSMENT AND PLAN:  CAD:  The patient has no ongoing symptoms. I am going to reduce his ASA to 81 mg daily.  No further testing is planned.   ISCHEMIC CM:   He seems to be euvolemic.  I will add spironolactone.  I will follow guidelines for spironolactone follow up in heart failure.  (Check potassium levels and  renal function 3--4 days and 1 week, then at least monthly for first 3 months and every 3 months thereafter after initiation of spironolactone.)  ICD:  I reviewed his ICD interrogation.  Impedance was OK and it is functioning normally.    HTN:  This is being managed in the context of treating his CHF  DYSLIPIDEMIA:   I will check a lipid profile    Current medicines are reviewed at length with the patient today.  The patient has no concerns regarding medicines.  The following changes have been made:  As above  Labs/ tests ordered today include:   Orders Placed This Encounter  Procedures  . COMPLETE METABOLIC PANEL WITH GFR  . Basic Metabolic Panel (BMET)  . Lipid Profile  . EKG 12-Lead     Disposition:   FU with me in 4 months.  New EP appt in 2 months.    Signed, Minus Breeding, MD  08/08/2015 8:38 PM    [place]

## 2015-08-08 NOTE — Patient Instructions (Addendum)
Your physician wants you to follow-up in: January 2017. You will receive a reminder letter in the mail two months in advance. If you don't receive a letter, please call our office to schedule the follow-up appointment.  Your physician has recommended you make the following change in your medication: START Spironolactone 25 mg daily and Decrease Aspirin 81 mg daily  Your physician recommends that you return for lab work CMP  and Fasting Lipids in 1 Week and BMP in 1 Month

## 2015-08-09 ENCOUNTER — Telehealth: Payer: Self-pay | Admitting: Cardiology

## 2015-08-09 NOTE — Telephone Encounter (Signed)
Faxed signed Release by patient to obtain all records from [place] [phone]) per Dr Rosezella [place] request.  Faxed on 08/09/15. lp

## 2015-08-19 DIAGNOSIS — I255 Ischemic cardiomyopathy: Secondary | ICD-10-CM | POA: Diagnosis not present

## 2015-08-19 DIAGNOSIS — Z79899 Other long term (current) drug therapy: Secondary | ICD-10-CM | POA: Diagnosis not present

## 2015-08-19 DIAGNOSIS — I2589 Other forms of chronic ischemic heart disease: Secondary | ICD-10-CM | POA: Diagnosis not present

## 2015-08-19 DIAGNOSIS — I2510 Atherosclerotic heart disease of native coronary artery without angina pectoris: Secondary | ICD-10-CM | POA: Diagnosis not present

## 2015-08-20 DIAGNOSIS — M1990 Unspecified osteoarthritis, unspecified site: Secondary | ICD-10-CM | POA: Diagnosis not present

## 2015-08-20 DIAGNOSIS — Z79899 Other long term (current) drug therapy: Secondary | ICD-10-CM | POA: Diagnosis not present

## 2015-08-20 DIAGNOSIS — Z0001 Encounter for general adult medical examination with abnormal findings: Secondary | ICD-10-CM | POA: Diagnosis not present

## 2015-08-20 DIAGNOSIS — Z23 Encounter for immunization: Secondary | ICD-10-CM | POA: Diagnosis not present

## 2015-08-20 DIAGNOSIS — E559 Vitamin D deficiency, unspecified: Secondary | ICD-10-CM | POA: Diagnosis not present

## 2015-08-20 DIAGNOSIS — Z95810 Presence of automatic (implantable) cardiac defibrillator: Secondary | ICD-10-CM | POA: Diagnosis not present

## 2015-08-20 DIAGNOSIS — Z1389 Encounter for screening for other disorder: Secondary | ICD-10-CM | POA: Diagnosis not present

## 2015-08-20 DIAGNOSIS — I2510 Atherosclerotic heart disease of native coronary artery without angina pectoris: Secondary | ICD-10-CM | POA: Diagnosis not present

## 2015-08-20 DIAGNOSIS — Z125 Encounter for screening for malignant neoplasm of prostate: Secondary | ICD-10-CM | POA: Diagnosis not present

## 2015-08-20 DIAGNOSIS — I10 Essential (primary) hypertension: Secondary | ICD-10-CM | POA: Diagnosis not present

## 2015-08-20 DIAGNOSIS — E7800 Pure hypercholesterolemia, unspecified: Secondary | ICD-10-CM | POA: Diagnosis not present

## 2015-08-20 LAB — COMPLETE METABOLIC PANEL WITH GFR
ALT: 13 U/L (ref 9–46)
AST: 18 U/L (ref 10–35)
Albumin: 3.8 g/dL (ref 3.6–5.1)
Alkaline Phosphatase: 65 U/L (ref 40–115)
BILIRUBIN TOTAL: 0.9 mg/dL (ref 0.2–1.2)
BUN: 12 mg/dL (ref 7–25)
CALCIUM: 8.8 mg/dL (ref 8.6–10.3)
CHLORIDE: 93 mmol/L — AB (ref 98–110)
CO2: 25 mmol/L (ref 20–31)
CREATININE: 0.89 mg/dL (ref 0.70–1.25)
GFR, EST NON AFRICAN AMERICAN: 87 mL/min (ref >=60)
Glucose, Bld: 96 mg/dL (ref 65–99)
Potassium: 5.1 mmol/L (ref 3.5–5.3)
Sodium: 128 mmol/L — ABNORMAL LOW (ref 135–146)
Total Protein: 6.2 g/dL (ref 6.1–8.1)

## 2015-08-20 LAB — LIPID PANEL
Cholesterol: 141 mg/dL (ref 125–200)
HDL: 24 mg/dL — AB (ref >=40)
LDL CALC: 104 mg/dL (ref <130)
TRIGLYCERIDES: 64 mg/dL (ref <150)
Total CHOL/HDL Ratio: 5.9 Ratio — ABNORMAL HIGH (ref <=5.0)
VLDL: 13 mg/dL (ref <30)

## 2015-08-21 ENCOUNTER — Telehealth: Payer: Self-pay | Admitting: Cardiology

## 2015-08-21 DIAGNOSIS — Z79899 Other long term (current) drug therapy: Secondary | ICD-10-CM

## 2015-08-21 NOTE — Telephone Encounter (Signed)
Patient would like lab results from Monday of this week.  Patient also states he had his yearly physical completed yesterday --he had labs there also and his PCP wants to change his medications.   Please call.

## 2015-08-21 NOTE — Telephone Encounter (Signed)
Returned call to patient. He states he had lab work ordered by Dr. Percival Spanish on 11/7 and then his PCP Dr. Inda Merlin with [facility] had him get lab work 11/8.   His PCP wants him to decrease spironolactone by half due to low sodium and increase statin to 40mg  per their lab results.   Patient is unsure which MD advice to follow.   Message routed to Dr. Percival Spanish as labs ordered by him have not resulted

## 2015-08-22 NOTE — Telephone Encounter (Signed)
I would like for him to follow the PCP instructions and make sure that he is reducing his fluid intake to 1.5 liters daily and get another BMET next week.

## 2015-08-23 MED ORDER — LOVASTATIN 40 MG PO TABS: 40.0000 mg | ORAL_TABLET | Freq: Every day | ORAL | Status: DC

## 2015-08-23 NOTE — Telephone Encounter (Signed)
Patient called and notified of MD advice. Med list updated. Rx for increased statin dose sent to pharmacy. Patient will have repeat BMET in 1 week in [place]

## 2015-09-04 ENCOUNTER — Telehealth: Payer: Self-pay | Admitting: *Deleted

## 2015-09-04 DIAGNOSIS — E871 Hypo-osmolality and hyponatremia: Secondary | ICD-10-CM | POA: Diagnosis not present

## 2015-09-04 NOTE — Telephone Encounter (Signed)
-----   Message from Minus Breeding, MD sent at 09/03/2015  5:44 PM EST ----- Na was low recently and I would like to repeat this now to see if it has improved.  Call Mr. Brinton with the results and send results to Henrine Screws, MD

## 2015-09-04 NOTE — Telephone Encounter (Signed)
Spoke with pt about his blood work, pt stated he just had some blood work drawn by Dr Inda Merlin today, stated he was fasting but didn't know if a CMP was drawn told pt when he get result back to have Dr Inda Merlin faxed [place] a copy. Patient also stated he's not taking his spironolactone didn't like the way it was making him feeling.

## 2015-09-17 ENCOUNTER — Telehealth: Payer: Self-pay | Admitting: Cardiology

## 2015-09-17 DIAGNOSIS — Z79899 Other long term (current) drug therapy: Secondary | ICD-10-CM

## 2015-09-17 NOTE — Telephone Encounter (Signed)
Spoke to patient, and he reports cramping in legs - had started spironolactone recently.  Review of chart shows pending BMET order - advised pt to go tomorrow to have this drawn and we would get findings - explained med creates potential to retain potassium, & other electrolyte levels also checked on this test.  Pt going to [facility] tomorrow for this test.  Routing to Dr Percival Spanish for additional advice.

## 2015-09-17 NOTE — Telephone Encounter (Signed)
LMTCB

## 2015-09-17 NOTE — Telephone Encounter (Signed)
Pt is calling in to speak with the nurse about his spironlactone medication. He says that it has been causing him to have muscle cramps and he wanted to know if this was a side effect and what could he do to fix this. Please f/u with the pt  Thanks

## 2015-09-18 DIAGNOSIS — Z79899 Other long term (current) drug therapy: Secondary | ICD-10-CM | POA: Diagnosis not present

## 2015-09-19 LAB — BASIC METABOLIC PANEL
BUN: 10 mg/dL (ref 7–25)
CO2: 24 mmol/L (ref 20–31)
Calcium: 8.8 mg/dL (ref 8.6–10.3)
Chloride: 97 mmol/L — ABNORMAL LOW (ref 98–110)
Creat: 0.73 mg/dL (ref 0.70–1.25)
Glucose, Bld: 121 mg/dL — ABNORMAL HIGH (ref 65–99)
POTASSIUM: 4.6 mmol/L (ref 3.5–5.3)
Sodium: 129 mmol/L — ABNORMAL LOW (ref 135–146)

## 2015-09-19 NOTE — Telephone Encounter (Addendum)
Pt calling back. Had lab results yesterday. Is anything recommended?  Also note he had been instructed to increase lovastatin at recent visit.  We didn't discuss this Tuesday but I informed him I would check - this seems a reasonable cause for his symptoms.  He notes his understanding that the statin dose was increased to address consistently low HDLs, but he has always had these.   Despite efforts, HDL number historically never above 30.  He is calling his previous cardiologist in [place] again today - 3rd attempt to get records sent to our office. Informed pt will have Dr. Percival Spanish review.

## 2015-09-20 NOTE — Telephone Encounter (Signed)
Advised pt to continue spironolactone. Regarding cramping in legs, take 1-2 week break from statin & then resume at half dose. Routing to Dr. Percival Spanish for additional recommendations. BMET in 2 weeks per result note recommendations.

## 2015-10-22 ENCOUNTER — Other Ambulatory Visit: Payer: Self-pay | Admitting: *Deleted

## 2015-10-22 MED ORDER — LOVASTATIN 40 MG PO TABS: 40.0000 mg | ORAL_TABLET | Freq: Every day | ORAL | Status: DC

## 2015-10-30 ENCOUNTER — Other Ambulatory Visit: Payer: Self-pay | Admitting: Cardiology

## 2015-10-31 NOTE — Telephone Encounter (Signed)
Rx request sent to pharmacy.  

## 2015-11-04 ENCOUNTER — Ambulatory Visit (INDEPENDENT_AMBULATORY_CARE_PROVIDER_SITE_OTHER): Payer: Medicare Other | Admitting: *Deleted

## 2015-11-04 DIAGNOSIS — I255 Ischemic cardiomyopathy: Secondary | ICD-10-CM | POA: Diagnosis not present

## 2015-11-06 ENCOUNTER — Encounter: Payer: Self-pay | Admitting: Cardiology

## 2015-11-11 ENCOUNTER — Telehealth: Payer: Self-pay | Admitting: Internal Medicine

## 2015-11-11 NOTE — Progress Notes (Signed)
Remote ICD transmission.   

## 2015-11-11 NOTE — Telephone Encounter (Signed)
Pt wants to know if you received his transmission on yesterday?

## 2015-11-11 NOTE — Telephone Encounter (Signed)
Spoke w/ pt and informed him that we did receive his remote transmission on 11-10-2015. Pt verbalized understanding and he informed me that his wirex doesn't get good cell signal. Ordered pt a wiret.

## 2015-11-18 LAB — CUP PACEART REMOTE DEVICE CHECK
Date Time Interrogation Session: 20170129214041
HighPow Impedance: 45 Ohm
HighPow Impedance: 54 Ohm
Implantable Lead Location: 753860
Implantable Lead Model: 6947
Lead Channel Sensing Intrinsic Amplitude: 14.22 mV
Lead Channel Setting Pacing Amplitude: 3 V
MDC IDC LEAD IMPLANT DT: 20100104
MDC IDC MSMT BATTERY VOLTAGE: 2.62 V
MDC IDC MSMT LEADCHNL RV IMPEDANCE VALUE: 416 Ohm
MDC IDC SET LEADCHNL RV PACING PULSEWIDTH: 0.4 ms
MDC IDC SET LEADCHNL RV SENSING SENSITIVITY: 0.3 mV
MDC IDC STAT BRADY RV PERCENT PACED: 11.7 %

## 2015-11-21 ENCOUNTER — Other Ambulatory Visit: Payer: Self-pay | Admitting: Cardiology

## 2015-11-21 NOTE — Telephone Encounter (Signed)
REFILL 

## 2015-11-22 ENCOUNTER — Encounter: Payer: Self-pay | Admitting: Cardiology

## 2015-11-29 ENCOUNTER — Other Ambulatory Visit: Payer: Self-pay | Admitting: Cardiology

## 2015-11-29 NOTE — Telephone Encounter (Signed)
Rx(s) sent to pharmacy electronically.  

## 2016-01-16 ENCOUNTER — Telehealth: Payer: Self-pay

## 2016-01-16 NOTE — Telephone Encounter (Signed)
Pt would like a phone call back to discuss changing his lovastatin dose.

## 2016-01-17 NOTE — Telephone Encounter (Signed)
Returned call to pt with no answer, Leave message for pt to call back

## 2016-01-21 NOTE — Telephone Encounter (Signed)
Pt stated he can't take 40 mg of his Lovastatin, he is having a lot cramps and leg pain want to know if he can reduced it to 20 mg because that works better for him, he said right now he is cutting the 40 mg in half, but want you to changed him to 20 mg.

## 2016-01-23 NOTE — Telephone Encounter (Signed)
OK to change to 20 mg daily.

## 2016-01-24 MED ORDER — LOVASTATIN 20 MG PO TABS: 20.0000 mg | ORAL_TABLET | Freq: Every day | ORAL | Status: DC

## 2016-01-24 NOTE — Telephone Encounter (Signed)
Pt aware of prescription send into pharmacy

## 2016-01-24 NOTE — Addendum Note (Signed)
Addended by: Vennie Homans on: 01/24/2016 11:58 AM   Modules accepted: Orders, Medications

## 2016-01-24 NOTE — Telephone Encounter (Signed)
Lovastatin 20 mg was send into pt pharmacy #[id number] refills

## 2016-01-27 DIAGNOSIS — M2012 Hallux valgus (acquired), left foot: Secondary | ICD-10-CM | POA: Insufficient documentation

## 2016-01-27 DIAGNOSIS — M19079 Primary osteoarthritis, unspecified ankle and foot: Secondary | ICD-10-CM | POA: Insufficient documentation

## 2016-01-27 DIAGNOSIS — T148XXA Other injury of unspecified body region, initial encounter: Secondary | ICD-10-CM | POA: Insufficient documentation

## 2016-01-27 DIAGNOSIS — M6702 Short Achilles tendon (acquired), left ankle: Secondary | ICD-10-CM | POA: Diagnosis not present

## 2016-01-27 DIAGNOSIS — M19072 Primary osteoarthritis, left ankle and foot: Secondary | ICD-10-CM | POA: Insufficient documentation

## 2016-01-27 DIAGNOSIS — S93145A Subluxation of metatarsophalangeal joint of left lesser toe(s), initial encounter: Secondary | ICD-10-CM | POA: Diagnosis not present

## 2016-01-27 DIAGNOSIS — M79672 Pain in left foot: Secondary | ICD-10-CM | POA: Diagnosis not present

## 2016-01-27 DIAGNOSIS — S93135A Subluxation of interphalangeal joint of left lesser toe(s), initial encounter: Secondary | ICD-10-CM | POA: Diagnosis not present

## 2016-01-27 DIAGNOSIS — M2142 Flat foot [pes planus] (acquired), left foot: Secondary | ICD-10-CM | POA: Insufficient documentation

## 2016-01-27 DIAGNOSIS — M6240 Contracture of muscle, unspecified site: Secondary | ICD-10-CM | POA: Diagnosis not present

## 2016-01-28 ENCOUNTER — Telehealth: Payer: Self-pay | Admitting: Internal Medicine

## 2016-01-28 NOTE — Telephone Encounter (Signed)
Follow up   Pt is calling for surgical clearance  He states that he just seen Dr. Stanford Breed and that a note may need to be made for the surgical clearance, and to have rn call  I told pt that we can schedule with a PA, offered May 1st at [facility] pt declined ( previous notes states : When is this surgery scheduled? Pending)  Pt states surgery is for next week 02/03/16

## 2016-01-28 NOTE — Telephone Encounter (Signed)
Follow up     Patient returning Dr. Warren Lacy nurse call from today.

## 2016-01-28 NOTE — Telephone Encounter (Signed)
Patient is due for follow up with Dr Percival Spanish.  Please schedule an appointment and he can be cleared after this appointment

## 2016-01-28 NOTE — Telephone Encounter (Signed)
Message send to scheduler for appt

## 2016-01-28 NOTE — Telephone Encounter (Signed)
*   Since Dr. Percival Spanish is out of the country*  Request for surgical clearance:  1. What type of surgery is being performed? Triple Arthrodesis of the left foot   2. When is this surgery scheduled? Pending    3. Are there any medications that need to be held prior to surgery and how long? Did not state    4. Name of physician performing surgery? Dr. Gardiner Fanti   5. What is your office phone and fax number? (fax) [phone]  6.

## 2016-01-28 NOTE — Telephone Encounter (Signed)
Scheduled patient to see Allie Bossier NP on Thursday afternoon

## 2016-01-29 ENCOUNTER — Encounter: Payer: Self-pay | Admitting: Internal Medicine

## 2016-01-29 ENCOUNTER — Telehealth: Payer: Self-pay | Admitting: *Deleted

## 2016-01-29 DIAGNOSIS — Z01818 Encounter for other preprocedural examination: Secondary | ICD-10-CM | POA: Diagnosis not present

## 2016-01-29 NOTE — Telephone Encounter (Signed)
Reviewed transmission, device at [facility] as of 01/27/16.  Patient is agreeable to appointment with Dr. Lovena Le on 01/31/16 to discuss generator change and pre-op clearance.  Patient is appreciative of call and denies questions or concerns at this time.  He is aware that appointment is at [place].  Spoke with Rise Paganini, who states that patient is having a procedure for "fusion of left foot bones".  Added to clearance form for clarity.

## 2016-01-29 NOTE — Telephone Encounter (Signed)
Called patient as we received a preop device clearance form from [place] patient that per his most recent remote transmission on 1/29/I7, his battery is approaching ERI.  Explained that I would like to review a more recent transmission to ensure that his device is not currently at [facility].  He denies hearing the auditory alert.  Due to poor cell reception at his house, patient's home monitor is not automatic.  Patient states he will send a transmission for review now.    LMOVM for [facility] in Pre-Op to clarify what type of procedure patient is having as it is not written on the preop clearance form.

## 2016-01-30 ENCOUNTER — Encounter: Payer: Self-pay | Admitting: Nurse Practitioner

## 2016-01-30 ENCOUNTER — Ambulatory Visit (INDEPENDENT_AMBULATORY_CARE_PROVIDER_SITE_OTHER): Payer: Medicare Other | Admitting: Nurse Practitioner

## 2016-01-30 VITALS — BP 118/83 | HR 50 | Ht 70.0 in | Wt 220.0 lb

## 2016-01-30 DIAGNOSIS — I255 Ischemic cardiomyopathy: Secondary | ICD-10-CM

## 2016-01-30 DIAGNOSIS — E785 Hyperlipidemia, unspecified: Secondary | ICD-10-CM | POA: Diagnosis not present

## 2016-01-30 DIAGNOSIS — Z4502 Encounter for adjustment and management of automatic implantable cardiac defibrillator: Secondary | ICD-10-CM | POA: Insufficient documentation

## 2016-01-30 DIAGNOSIS — I2510 Atherosclerotic heart disease of native coronary artery without angina pectoris: Secondary | ICD-10-CM

## 2016-01-30 DIAGNOSIS — I110 Hypertensive heart disease with heart failure: Secondary | ICD-10-CM | POA: Diagnosis not present

## 2016-01-30 DIAGNOSIS — I5022 Chronic systolic (congestive) heart failure: Secondary | ICD-10-CM

## 2016-01-30 DIAGNOSIS — Z01810 Encounter for preprocedural cardiovascular examination: Secondary | ICD-10-CM

## 2016-01-30 DIAGNOSIS — I119 Hypertensive heart disease without heart failure: Secondary | ICD-10-CM | POA: Insufficient documentation

## 2016-01-30 NOTE — Progress Notes (Signed)
Office Visit    Patient Name: Stephen Adams Date of Encounter: 01/30/2016  Primary Care Provider:  Henrine Screws, MD Primary Cardiologist:  J. Hochrein, MD / G. Lovena Le, MD   Chief Complaint    70 year old male with a history of CAD status post remote CABG who presents for preoperative evaluation.  Past Medical History    Past Medical History  Diagnosis Date  . Hypertensive heart disease   . Hyperlipidemia   . CAD (coronary artery disease)     a. 1989 PTCA LAD; b. 1999 BMS to LAD & RCA; c. 2000 PTCA RCA 2/2 ISR; d. 10/1999 Inf MI/VF Arrest: 4 stents to RCA 2/2 ISR; e. 05/2001 Cath: ISR RCA, sev LAD dzs, EF 35-40%-->CABG (details unknown); f. 09/2013 MV: Inferior infarct, no ischemia.  . Ischemic cardiomyopathy     a. 02/2015 Echo: EF 25-30%, inf, apical AK, mid-apical-anteroseptal and ant HK, Gr1 DD, mild AI, mod dil LA.  . 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 10/2008 s/p MDT [employer] single lead AICD. Ser # [id number] H.  . Chronic systolic CHF (congestive heart failure) ([place])     a. 02/2015 Echo: EF 25-30%.  . DJD (degenerative joint disease)    Past Surgical History  Procedure Laterality Date  . Total shoulder replacement Bilateral   . Total knee arthroplasty Left   . Coronary artery bypass graft      4 vessel [facility].  Dr. Burns Spain 06/2000    Allergies  No Known Allergies  History of Present Illness    70 year old male with the above complex past medical history. He is status post multiple percutaneous interventions in the late 80s and 90s with subsequent CABG in 2002. All of his prior procedures were performed in [place]. His last stress test wasn't decerebrate 2014 revealing inferior infarct without ischemia. He also has an AICD and is followed locally in our electrophysiology clinic. His last echo was performed in 2016 revealing an EF of 25-30%. Despite this, Stephen Adams is well compensated. He is very active,  typically walking up to 2 miles daily. He also wants. He has not been having any chest pain or dyspnea and further denies any PND, orthopnea, dizziness, syncope, edema, or early satiety. He is compliant with all of his medications. He has had some degree of chronic left foot pain that is worsened over the past 3 years. This has been present ever since he slipped on some ice and ruptured a tendon 2014. He had been conservatively managed with a brace however more recently, this brace has been creating an ulceration on a bunion. He sought surgical evaluation at [facility] on April 17. X-ray showed a large hallux valgus deformity and mild hallux rigidus along with a pes planus deformity with increased talar lift. He is pending surgical correction next week and seeks preoperative cardiovascular evaluation today.  Home Medications    Prior to Admission medications   Medication Sig Start Date End Date Taking? Authorizing Provider  aspirin 81 MG tablet Take 81 mg by mouth daily.   Yes Historical Provider, MD  carvedilol (COREG) 6.25 MG tablet TAKE 1 TABLET BY MOUTH TWICE A DAY 11/21/15  Yes Minus Breeding, MD  irbesartan-hydrochlorothiazide (AVALIDE) 150-12.5 MG tablet TAKE 1 TABLET BY MOUTH EVERY DAY 11/21/15  Yes Minus Breeding, MD  lovastatin (MEVACOR) 20 MG tablet Take 1 tablet (20 mg total) by mouth at bedtime. 01/24/16  Yes Minus Breeding, MD  pantoprazole (PROTONIX) 40 MG tablet TAKE  1 TABLET BY MOUTH EVERY DAY 11/29/15  Yes Minus Breeding, MD  spironolactone (ALDACTONE) 25 MG tablet Take 12.5 mg by mouth daily.   Yes Historical Provider, MD  sulfamethoxazole-trimethoprim (BACTRIM DS,SEPTRA DS) 800-160 MG tablet Take 1 tablet by mouth 2 (two) times daily.   Yes Historical Provider, MD    Review of Systems    As above, he has been doing exceptionally well and remained significantly active even despite left foot pain.  He denies chest pain, palpitations, dyspnea, pnd, orthopnea, n, v, dizziness, syncope, edema,  weight gain, or early satiety.  All other systems reviewed and are otherwise negative except as noted above.  Physical Exam    VS:  BP 118/83 mmHg  Pulse 50  Ht 5\' 10"  (1.778 m)  Wt 220 lb (99.791 kg)  BMI 31.57 kg/m2 , BMI Body mass index is 31.57 kg/(m^2). GEN: Well nourished, well developed, in no acute distress. HEENT: normal. Neck: Supple, no JVD, carotid bruits, or masses. Cardiac: RRR, no murmurs, rubs, or gallops. No clubbing, cyanosis, edema.  Radials/DP/PT 2+ and equal bilaterally.  Respiratory:  Respirations regular and unlabored, clear to auscultation bilaterally. GI: Soft, nontender, nondistended, BS + x 4. MS: no deformity or atrophy. Skin: warm and dry, no rash. Neuro:  Strength and sensation are intact. Psych: Normal affect.  Accessory Clinical Findings    ECG - Sinus bradycardia, 50, wide first-degree AV block with a PR interval of 440 ms. prior inferior infarct, prior anteroseptal infarct, LVH, PVC. Lateral T-wave inversion in inferior J-point elevation. There are no acute ST or T changes.  Assessment & Plan    1.  Coronary artery disease/preoperative cardiovascular evaluation: Patient has a history of remote coronary artery bypass grafting with nonischemic stress testing in 2014. He is very active, walking up to 2 miles daily and also wants regularly. He is able to perform all these activities without any chest pain or dyspnea. He is now pending left ankle and foot surgeries utilizing a nerve block. Though he has elevated risk factors including coronary disease and CHF, his excellent functional capacity dictates that he will not require any preoperative ischemic testing. He may proceed to surgery with the recommendation that he remain on the beta blocker and statin therapy throughout the perioperative period. Aspirin should be resumed as soon as felt to be appropriate from a surgical standpoint.  2. Ischemic cardiomyopathy/chronic systolic congestive heart failure: He  is euvolemic on exam today. He is very functional without limitations in his activity. He remains on beta blocker, ARB, and spironolactone therapy. Fluids status will need to be observed closely in the perioperative period. Of note, he is also status post AICD placement. Transmission on April 17 shows that device has reached recommended replacement time.  He will see Dr. Lovena Le tomorrow to discuss further.  3. Hypertensive heart disease: Blood pressure stable. Continue beta blocker, ARB, HCTZ.  4. Hyperlipidemia: He remains on lovastatin 20 mg. He did try taking 40 mg but developed myalgias. He is able to tolerate 20 mg dose. LDL was 104 November 2016.  5. Disposition: Follow-up with Dr. Percival Spanish in 6 months or sooner if necessary.  Murray Hodgkins, NP 01/30/2016, 3:25 PM

## 2016-01-30 NOTE — Patient Instructions (Signed)
Please keep your appointment with Dr Lovena Le tomorrow.  Ignacia Bayley, NP, recommends that you schedule a follow-up appointment in 6 months with Dr Percival Spanish. You will receive a reminder letter in the mail two months in advance. If you don't receive a letter, please call our office to schedule the follow-up appointment.  If you need a refill on your cardiac medications before your next appointment, please call your pharmacy.

## 2016-01-31 ENCOUNTER — Ambulatory Visit (INDEPENDENT_AMBULATORY_CARE_PROVIDER_SITE_OTHER): Payer: Medicare Other | Admitting: Internal Medicine

## 2016-01-31 ENCOUNTER — Encounter: Payer: Self-pay | Admitting: Internal Medicine

## 2016-01-31 VITALS — BP 124/78 | HR 59 | Ht 70.0 in | Wt 220.2 lb

## 2016-01-31 DIAGNOSIS — I5022 Chronic systolic (congestive) heart failure: Secondary | ICD-10-CM | POA: Diagnosis not present

## 2016-01-31 DIAGNOSIS — I255 Ischemic cardiomyopathy: Secondary | ICD-10-CM

## 2016-01-31 MED ORDER — FUROSEMIDE 20 MG PO TABS: 20.0000 mg | ORAL_TABLET | ORAL | Status: DC | PRN

## 2016-01-31 NOTE — Progress Notes (Signed)
HPI [name] returns today for ongoing management of his ICD. He is a very pleasant 70 year old man with a long-standing history of ischemic heart disease, status post MI initially at age 3. He had undergone bypass surgery approximately 15 years ago. Prior to that he had had multiple coronary stents placed. The patient has never had syncope and denies any prior ICD shocks. No history of arrhythmia. 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 He spends his time between [facility] and [place]. He has developed foot problems and is pending foot surgery where he is scheduled to receive an ankle fusion. He has reached ERI on his device.  No Known Allergies   Current Outpatient Prescriptions  Medication Sig Dispense Refill  . aspirin 81 MG tablet Take 81 mg by mouth daily.    . carvedilol (COREG) 6.25 MG tablet TAKE 1 TABLET BY MOUTH TWICE A DAY 180 tablet 1  . irbesartan-hydrochlorothiazide (AVALIDE) 150-12.5 MG tablet TAKE 1 TABLET BY MOUTH EVERY DAY 90 tablet 1  . lovastatin (MEVACOR) 20 MG tablet Take 1 tablet (20 mg total) by mouth at bedtime. 90 tablet 3  . pantoprazole (PROTONIX) 40 MG tablet TAKE 1 TABLET BY MOUTH EVERY DAY 30 tablet 8  . spironolactone (ALDACTONE) 25 MG tablet Take 12.5 mg by mouth daily.    Marland Kitchen sulfamethoxazole-trimethoprim (BACTRIM DS,SEPTRA DS) 800-160 MG tablet Take 1 tablet by mouth 2 (two) times daily.    . furosemide (LASIX) 20 MG tablet Take 1 tablet (20 mg total) by mouth as needed (sob). 30 tablet 3   No current facility-administered medications for this visit.     Past Medical History  Diagnosis Date  . Hypertensive heart disease   . Hyperlipidemia   . CAD (coronary artery disease)     a. 1989 PTCA LAD; b. 1999 BMS to LAD & RCA; c. 2000 PTCA RCA 2/2 ISR; d. 10/1999 Inf MI/VF Arrest: 4 stents to RCA 2/2 ISR; e. 05/2001 Cath: ISR RCA, sev LAD dzs, EF 35-40%-->CABG (details unknown); f. 09/2013 MV: Inferior  infarct, no ischemia.  . Ischemic cardiomyopathy     a. 02/2015 Echo: EF 25-30%, inf, apical AK, mid-apical-anteroseptal and ant HK, Gr1 DD, mild AI, mod dil LA.  . 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 10/2008 s/p MDT [employer] single lead AICD. Ser # [id number] H.  . Chronic systolic CHF (congestive heart failure) ([place])     a. 02/2015 Echo: EF 25-30%.  . DJD (degenerative joint disease)     ROS:   All systems reviewed and negative except as noted in the HPI.   Past Surgical History  Procedure Laterality Date  . Total shoulder replacement Bilateral   . Total knee arthroplasty Left   . Coronary artery bypass graft      4 vessel [facility].  Dr. [name] 06/2000     Family History  Problem Relation Age of Onset  . Hypertension Father   . Heart Problems Father     bypass 1992  . CAD Mother   . Pancreatic cancer Mother   . Heart Problems Maternal Grandfather      Social History   Social History  . Marital Status: Married    Spouse Name: N/A  . Number of Children: 4  . Years of Education: N/A   Occupational History  . Beef Cattle Farmer    Social History Main Topics  . Smoking status: Never Smoker   .  Smokeless tobacco: Not on file  . Alcohol Use: Not on file  . Drug Use: Not on file  . Sexual Activity: Not on file   Other Topics Concern  . Not on file   Social History Narrative   Lives with wife and pony and draft horses.       BP 124/78 mmHg  Pulse 59  Ht 5\' 10"  (1.778 m)  Wt 220 lb 3.2 oz (99.882 kg)  BMI 31.60 kg/m2  SpO2 96%  Physical Exam:  Well appearing 70 year old man, NAD HEENT: Unremarkable Neck:  6 cm JVD, no thyromegally Back:  No CVA tenderness Lungs:  Clear with no wheezes, rales, or rhonchi. HEART:  Regular rate rhythm, no murmurs, no rubs, no clicks Abd:  soft, positive bowel sounds, no organomegally, no rebound, no guarding Ext:  2 plus pulses, no edema, no cyanosis, no clubbing Skin:  No rashes  no nodules Neuro:  CN II through XII intact, motor grossly intact   DEVICE  Normal device function.  See PaceArt for details. Device at [employer].   Assess/Plan: 1. ICD - his device is working normally but has reached ERI. I discussed the treatment options. He is not pacing and I have recommended he undergo ankle surgery followed by ICD generator change out in a few months after his ankle has healed.  2. Chronic systolic heart failure - his symptoms remain class 2. No change in meds. 3. ICM - he denies anginal symptoms. No change in meds. 4. Preoperative evaluation - from the ICD/CHF/Arrhythmia perspective, he is stable to proceed with surgery. Low risk.  Stephen Adams.D.

## 2016-01-31 NOTE — Patient Instructions (Addendum)
Medication Instructions:  1) Take Furosemide 20 mg as needed for SOB   Labwork: None ordered   Testing/Procedures: None ordered   Follow-Up: Your physician recommends that you schedule a follow-up appointment in: early July with Dr Lovena Le  Any Other Special Instructions Will Be Listed Below (If Applicable).     If you need a refill on your cardiac medications before your next appointment, please call your pharmacy.

## 2016-02-04 DIAGNOSIS — S93135A Subluxation of interphalangeal joint of left lesser toe(s), initial encounter: Secondary | ICD-10-CM | POA: Diagnosis not present

## 2016-02-04 DIAGNOSIS — M6240 Contracture of muscle, unspecified site: Secondary | ICD-10-CM | POA: Insufficient documentation

## 2016-02-04 DIAGNOSIS — M2142 Flat foot [pes planus] (acquired), left foot: Secondary | ICD-10-CM | POA: Diagnosis not present

## 2016-02-04 DIAGNOSIS — I2510 Atherosclerotic heart disease of native coronary artery without angina pectoris: Secondary | ICD-10-CM | POA: Diagnosis not present

## 2016-02-04 DIAGNOSIS — I252 Old myocardial infarction: Secondary | ICD-10-CM | POA: Diagnosis not present

## 2016-02-04 DIAGNOSIS — S93125A Dislocation of metatarsophalangeal joint of left lesser toe(s), initial encounter: Secondary | ICD-10-CM | POA: Diagnosis not present

## 2016-02-04 DIAGNOSIS — K219 Gastro-esophageal reflux disease without esophagitis: Secondary | ICD-10-CM | POA: Diagnosis not present

## 2016-02-04 DIAGNOSIS — M19072 Primary osteoarthritis, left ankle and foot: Secondary | ICD-10-CM | POA: Diagnosis not present

## 2016-02-04 DIAGNOSIS — M79672 Pain in left foot: Secondary | ICD-10-CM | POA: Diagnosis not present

## 2016-02-04 DIAGNOSIS — S93145A Subluxation of metatarsophalangeal joint of left lesser toe(s), initial encounter: Secondary | ICD-10-CM | POA: Diagnosis not present

## 2016-02-04 DIAGNOSIS — I10 Essential (primary) hypertension: Secondary | ICD-10-CM | POA: Diagnosis not present

## 2016-02-04 DIAGNOSIS — Z950 Presence of cardiac pacemaker: Secondary | ICD-10-CM | POA: Diagnosis not present

## 2016-02-04 DIAGNOSIS — M6702 Short Achilles tendon (acquired), left ankle: Secondary | ICD-10-CM | POA: Diagnosis not present

## 2016-02-04 DIAGNOSIS — M2012 Hallux valgus (acquired), left foot: Secondary | ICD-10-CM | POA: Diagnosis not present

## 2016-02-17 DIAGNOSIS — M19072 Primary osteoarthritis, left ankle and foot: Secondary | ICD-10-CM | POA: Diagnosis not present

## 2016-02-17 DIAGNOSIS — M2142 Flat foot [pes planus] (acquired), left foot: Secondary | ICD-10-CM | POA: Diagnosis not present

## 2016-02-17 DIAGNOSIS — M6240 Contracture of muscle, unspecified site: Secondary | ICD-10-CM | POA: Diagnosis not present

## 2016-02-17 DIAGNOSIS — M2012 Hallux valgus (acquired), left foot: Secondary | ICD-10-CM | POA: Diagnosis not present

## 2016-02-17 DIAGNOSIS — S93135D Subluxation of interphalangeal joint of left lesser toe(s), subsequent encounter: Secondary | ICD-10-CM | POA: Diagnosis not present

## 2016-02-17 DIAGNOSIS — S93145A Subluxation of metatarsophalangeal joint of left lesser toe(s), initial encounter: Secondary | ICD-10-CM | POA: Diagnosis not present

## 2016-02-17 DIAGNOSIS — M6702 Short Achilles tendon (acquired), left ankle: Secondary | ICD-10-CM | POA: Diagnosis not present

## 2016-02-24 DIAGNOSIS — M2142 Flat foot [pes planus] (acquired), left foot: Secondary | ICD-10-CM | POA: Diagnosis not present

## 2016-02-24 DIAGNOSIS — M6240 Contracture of muscle, unspecified site: Secondary | ICD-10-CM | POA: Diagnosis not present

## 2016-02-24 DIAGNOSIS — M19072 Primary osteoarthritis, left ankle and foot: Secondary | ICD-10-CM | POA: Diagnosis not present

## 2016-02-24 DIAGNOSIS — M6702 Short Achilles tendon (acquired), left ankle: Secondary | ICD-10-CM | POA: Diagnosis not present

## 2016-02-24 DIAGNOSIS — M2012 Hallux valgus (acquired), left foot: Secondary | ICD-10-CM | POA: Diagnosis not present

## 2016-03-02 ENCOUNTER — Emergency Department (HOSPITAL_COMMUNITY): Payer: Medicare Other

## 2016-03-02 ENCOUNTER — Encounter (HOSPITAL_COMMUNITY): Payer: Self-pay

## 2016-03-02 ENCOUNTER — Observation Stay (HOSPITAL_COMMUNITY): Payer: Medicare Other

## 2016-03-02 ENCOUNTER — Inpatient Hospital Stay (HOSPITAL_COMMUNITY)
Admission: EM | Admit: 2016-03-02 | Discharge: 2016-03-05 | DRG: 640 | Disposition: A | Discharge status: Home / Self Care | Payer: Medicare Other | Attending: Internal Medicine | Admitting: Internal Medicine

## 2016-03-02 DIAGNOSIS — Z96652 Presence of left artificial knee joint: Secondary | ICD-10-CM | POA: Diagnosis present

## 2016-03-02 DIAGNOSIS — I255 Ischemic cardiomyopathy: Secondary | ICD-10-CM | POA: Diagnosis present

## 2016-03-02 DIAGNOSIS — L03116 Cellulitis of left lower limb: Secondary | ICD-10-CM | POA: Diagnosis present

## 2016-03-02 DIAGNOSIS — Z96611 Presence of right artificial shoulder joint: Secondary | ICD-10-CM | POA: Diagnosis present

## 2016-03-02 DIAGNOSIS — Z95810 Presence of automatic (implantable) cardiac defibrillator: Secondary | ICD-10-CM

## 2016-03-02 DIAGNOSIS — J189 Pneumonia, unspecified organism: Secondary | ICD-10-CM | POA: Diagnosis not present

## 2016-03-02 DIAGNOSIS — S0990XA Unspecified injury of head, initial encounter: Secondary | ICD-10-CM | POA: Diagnosis not present

## 2016-03-02 DIAGNOSIS — I5022 Chronic systolic (congestive) heart failure: Secondary | ICD-10-CM | POA: Diagnosis not present

## 2016-03-02 DIAGNOSIS — I110 Hypertensive heart disease with heart failure: Secondary | ICD-10-CM | POA: Diagnosis present

## 2016-03-02 DIAGNOSIS — E860 Dehydration: Secondary | ICD-10-CM | POA: Diagnosis not present

## 2016-03-02 DIAGNOSIS — R55 Syncope and collapse: Secondary | ICD-10-CM | POA: Diagnosis not present

## 2016-03-02 DIAGNOSIS — R001 Bradycardia, unspecified: Secondary | ICD-10-CM | POA: Diagnosis not present

## 2016-03-02 DIAGNOSIS — E785 Hyperlipidemia, unspecified: Secondary | ICD-10-CM | POA: Diagnosis not present

## 2016-03-02 DIAGNOSIS — R531 Weakness: Secondary | ICD-10-CM | POA: Diagnosis not present

## 2016-03-02 DIAGNOSIS — T502X5A Adverse effect of carbonic-anhydrase inhibitors, benzothiadiazides and other diuretics, initial encounter: Secondary | ICD-10-CM | POA: Diagnosis present

## 2016-03-02 DIAGNOSIS — E871 Hypo-osmolality and hyponatremia: Secondary | ICD-10-CM | POA: Diagnosis not present

## 2016-03-02 DIAGNOSIS — E878 Other disorders of electrolyte and fluid balance, not elsewhere classified: Secondary | ICD-10-CM | POA: Diagnosis present

## 2016-03-02 DIAGNOSIS — Z96612 Presence of left artificial shoulder joint: Secondary | ICD-10-CM | POA: Diagnosis present

## 2016-03-02 DIAGNOSIS — I2510 Atherosclerotic heart disease of native coronary artery without angina pectoris: Secondary | ICD-10-CM | POA: Diagnosis present

## 2016-03-02 DIAGNOSIS — R110 Nausea: Secondary | ICD-10-CM | POA: Diagnosis not present

## 2016-03-02 DIAGNOSIS — R404 Transient alteration of awareness: Secondary | ICD-10-CM | POA: Diagnosis not present

## 2016-03-02 DIAGNOSIS — Z7982 Long term (current) use of aspirin: Secondary | ICD-10-CM

## 2016-03-02 LAB — TSH: TSH: 1.837 u[IU]/mL (ref 0.350–4.500)

## 2016-03-02 LAB — CBC WITH DIFFERENTIAL/PLATELET
BASOS PCT: 0 %
Basophils Absolute: 0 10*3/uL (ref 0.0–0.1)
Eosinophils Absolute: 0.2 10*3/uL (ref 0.0–0.7)
Eosinophils Relative: 2 %
HEMATOCRIT: 37.9 % — AB (ref 39.0–52.0)
HEMOGLOBIN: 13.5 g/dL (ref 13.0–17.0)
LYMPHS ABS: 1 10*3/uL (ref 0.7–4.0)
LYMPHS PCT: 10 %
MCH: 30.8 pg (ref 26.0–34.0)
MCHC: 35.6 g/dL (ref 30.0–36.0)
MCV: 86.5 fL (ref 78.0–100.0)
MONO ABS: 0.7 10*3/uL (ref 0.1–1.0)
Monocytes Relative: 8 %
NEUTROS ABS: 7.9 10*3/uL — AB (ref 1.7–7.7)
Neutrophils Relative %: 80 %
Platelets: 339 10*3/uL (ref 150–400)
RBC: 4.38 MIL/uL (ref 4.22–5.81)
RDW: 12.1 % (ref 11.5–15.5)
WBC: 9.9 10*3/uL (ref 4.0–10.5)

## 2016-03-02 LAB — COMPREHENSIVE METABOLIC PANEL
ALT: 24 U/L (ref 17–63)
AST: 26 U/L (ref 15–41)
Albumin: 3.1 g/dL — ABNORMAL LOW (ref 3.5–5.0)
Alkaline Phosphatase: 67 U/L (ref 38–126)
Anion gap: 10 (ref 5–15)
BUN: 11 mg/dL (ref 6–20)
CHLORIDE: 88 mmol/L — AB (ref 101–111)
CO2: 26 mmol/L (ref 22–32)
Calcium: 9.1 mg/dL (ref 8.9–10.3)
Creatinine, Ser: 0.96 mg/dL (ref 0.61–1.24)
GFR calc Af Amer: >60 mL/min (ref >60)
GFR calc non Af Amer: >60 mL/min (ref >60)
Glucose, Bld: 117 mg/dL — ABNORMAL HIGH (ref 65–99)
Potassium: 4.7 mmol/L (ref 3.5–5.1)
Sodium: 124 mmol/L — ABNORMAL LOW (ref 135–145)
Total Bilirubin: 1.3 mg/dL — ABNORMAL HIGH (ref 0.3–1.2)
Total Protein: 6.7 g/dL (ref 6.5–8.1)

## 2016-03-02 LAB — TROPONIN I

## 2016-03-02 LAB — I-STAT CG4 LACTIC ACID, ED: LACTIC ACID, VENOUS: 1.3 mmol/L (ref 0.5–2.0)

## 2016-03-02 LAB — MAGNESIUM: Magnesium: 1.4 mg/dL — ABNORMAL LOW (ref 1.7–2.4)

## 2016-03-02 LAB — I-STAT TROPONIN, ED: Troponin i, poc: 0 ng/mL (ref 0.00–0.08)

## 2016-03-02 MED ORDER — SODIUM CHLORIDE 0.9% FLUSH
3.0000 mL | Freq: Two times a day (BID) | INTRAVENOUS | Status: DC
  Administered 2016-03-02 – 2016-03-05 (×6): 3 mL via INTRAVENOUS

## 2016-03-02 MED ORDER — IOPAMIDOL (ISOVUE-370) INJECTION 76%
INTRAVENOUS | Status: AC
  Administered 2016-03-02: 100 mL
  Filled 2016-03-02: qty 100

## 2016-03-02 MED ORDER — ENOXAPARIN SODIUM 40 MG/0.4ML ~~LOC~~ SOLN
40.0000 mg | SUBCUTANEOUS | Status: DC
  Administered 2016-03-02 – 2016-03-04 (×3): 40 mg via SUBCUTANEOUS
  Filled 2016-03-02 (×3): qty 0.4

## 2016-03-02 MED ORDER — VANCOMYCIN HCL 10 G IV SOLR
2000.0000 mg | Freq: Once | INTRAVENOUS | Status: AC
  Administered 2016-03-02: 2000 mg via INTRAVENOUS
  Filled 2016-03-02: qty 2000

## 2016-03-02 MED ORDER — PRAVASTATIN SODIUM 20 MG PO TABS
20.0000 mg | ORAL_TABLET | Freq: Every day | ORAL | Status: DC
  Administered 2016-03-03 – 2016-03-04 (×2): 20 mg via ORAL
  Filled 2016-03-02 (×2): qty 1

## 2016-03-02 MED ORDER — ONDANSETRON HCL 4 MG PO TABS: 4.0000 mg | ORAL_TABLET | Freq: Four times a day (QID) | ORAL | Status: DC | PRN

## 2016-03-02 MED ORDER — ONDANSETRON HCL 4 MG/2ML IJ SOLN
4.0000 mg | Freq: Once | INTRAMUSCULAR | Status: AC
  Administered 2016-03-02: 4 mg via INTRAVENOUS
  Filled 2016-03-02: qty 2

## 2016-03-02 MED ORDER — SODIUM CHLORIDE 0.9 % IV BOLUS (SEPSIS)
500.0000 mL | Freq: Once | INTRAVENOUS | Status: AC
  Administered 2016-03-02: 500 mL via INTRAVENOUS

## 2016-03-02 MED ORDER — DEXTROSE 5 % IV SOLN
1.0000 g | Freq: Three times a day (TID) | INTRAVENOUS | Status: DC
  Administered 2016-03-02 – 2016-03-05 (×8): 1 g via INTRAVENOUS
  Filled 2016-03-02 (×11): qty 1

## 2016-03-02 MED ORDER — SODIUM CHLORIDE 0.9 % IV SOLN
INTRAVENOUS | Status: AC
  Administered 2016-03-02: 21:00:00 via INTRAVENOUS

## 2016-03-02 MED ORDER — PANTOPRAZOLE SODIUM 40 MG PO TBEC
40.0000 mg | DELAYED_RELEASE_TABLET | Freq: Every day | ORAL | Status: DC
  Administered 2016-03-03 – 2016-03-05 (×3): 40 mg via ORAL
  Filled 2016-03-02 (×3): qty 1

## 2016-03-02 MED ORDER — VANCOMYCIN HCL 10 G IV SOLR
1250.0000 mg | Freq: Two times a day (BID) | INTRAVENOUS | Status: DC
  Administered 2016-03-03 – 2016-03-05 (×5): 1250 mg via INTRAVENOUS
  Filled 2016-03-02 (×7): qty 1250

## 2016-03-02 MED ORDER — ONDANSETRON HCL 4 MG/2ML IJ SOLN: 4.0000 mg | Freq: Four times a day (QID) | INTRAMUSCULAR | Status: DC | PRN

## 2016-03-02 MED ORDER — MORPHINE SULFATE (PF) 2 MG/ML IV SOLN: 2.0000 mg | INTRAVENOUS | Status: DC | PRN

## 2016-03-02 MED ORDER — ACETAMINOPHEN 325 MG PO TABS
650.0000 mg | ORAL_TABLET | Freq: Four times a day (QID) | ORAL | Status: DC | PRN
  Administered 2016-03-02: 650 mg via ORAL
  Filled 2016-03-02: qty 2

## 2016-03-02 MED ORDER — ACETAMINOPHEN 650 MG RE SUPP: 650.0000 mg | Freq: Four times a day (QID) | RECTAL | Status: DC | PRN

## 2016-03-02 NOTE — ED Notes (Signed)
Pt to CTA prior to going to inpatient admission bed.

## 2016-03-02 NOTE — ED Provider Notes (Signed)
CSN: [id number]     Arrival date & time 03/02/16  1452 History   First MD Initiated Contact with Patient 03/02/16 1510     Chief Complaint  Patient presents with  . Loss of Consciousness    HPI Stephen Adams is a 70 y.o. M with hx of ischemic CM, AICD in place, sCHF, HLD, HTN who presents with syncope.  Felt unwell this AM.  While in shower at 130 PM, he felt nauseous, then vomited multiple times followed by passing out for a few seconds. Preceding, felt diaphoretic and weak generally.  Mildly confused after event for a few seconds, then returned.   No chest pain. No SOB.  No abd pain.  Did not take ASA today in prep for left calcaneus fx tomorrow. NWB on left leg, was not triggered by pain.  BP initially by EMS 80/40, improved with 500 cc NS bolus.  No diarrhea/constipation.  Nml UOP.  No drug use.  No hx of syncope.  Passed out in past month after taking percocet.  Took a tramadol last night.  No preceding heart racing.  Wife was with him when it happened, did not strike ground, was sitting on a bench when it happened.  No recent illnesses.  Feeling better now, except for HA, dull generalized. .  No hx of DVT/PE.    [employer] AICD  Past Medical History  Diagnosis Date  . Hypertensive heart disease   . Hyperlipidemia   . CAD (coronary artery disease)     a. 1989 PTCA LAD; b. 1999 BMS to LAD & RCA; c. 2000 PTCA RCA 2/2 ISR; d. 10/1999 Inf MI/VF Arrest: 4 stents to RCA 2/2 ISR; e. 05/2001 Cath: ISR RCA, sev LAD dzs, EF 35-40%-->CABG (details unknown); f. 09/2013 MV: Inferior infarct, no ischemia.  . Ischemic cardiomyopathy     a. 02/2015 Echo: EF 25-30%, inf, apical AK, mid-apical-anteroseptal and ant HK, Gr1 DD, mild AI, mod dil LA.  . 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 10/2008 s/p MDT [employer] DR single lead AICD. Ser # [id number] H.  . Chronic systolic CHF (congestive heart failure) ([place])     a. 02/2015 Echo: EF 25-30%.  . DJD (degenerative joint disease)     Past Surgical History  Procedure Laterality Date  . Total shoulder replacement Bilateral   . Total knee arthroplasty Left   . Coronary artery bypass graft      4 vessel [facility].  Dr. Burns [place] 06/2000   Family History  Problem Relation Age of Onset  . Hypertension Father   . Heart Problems Father     bypass 1992  . CAD Mother   . Pancreatic cancer Mother   . Heart Problems Maternal Grandfather    Social History  Substance Use Topics  . Smoking status: Never Smoker   . Smokeless tobacco: None  . Alcohol Use: None    Review of Systems  Constitutional: Positive for activity change and fatigue. Negative for fever and chills.  Respiratory: Negative for cough, shortness of breath and wheezing.   Cardiovascular: Negative for chest pain.  Gastrointestinal: Positive for nausea and vomiting. Negative for abdominal pain.  Endocrine: Negative for polyuria.  Genitourinary: Negative for dysuria and flank pain.  Musculoskeletal: Negative for myalgias and back pain.  Skin: Negative for color change, rash and wound.  Neurological: Positive for syncope and headaches. Negative for seizures and light-headedness.  Psychiatric/Behavioral: Negative for confusion.  All other systems reviewed and are negative.   Allergies  Review of patient's allergies indicates no known allergies.  Home Medications   Prior to Admission medications   Medication Sig Start Date End Date Taking? Authorizing Provider  aspirin 81 MG tablet Take 81 mg by mouth daily.   Yes Historical Provider, MD  carvedilol (COREG) 6.25 MG tablet TAKE 1 TABLET BY MOUTH TWICE A DAY 11/21/15  Yes Minus Breeding, MD  irbesartan-hydrochlorothiazide (AVALIDE) 150-12.5 MG tablet TAKE 1 TABLET BY MOUTH EVERY DAY 11/21/15  Yes Minus Breeding, MD  lovastatin (MEVACOR) 20 MG tablet Take 1 tablet (20 mg total) by mouth at bedtime. 01/24/16  Yes Minus Breeding, MD  pantoprazole (PROTONIX) 40 MG tablet TAKE 1 TABLET BY MOUTH EVERY DAY 11/29/15   Yes Minus Breeding, MD  traMADol (ULTRAM) 50 MG tablet Take 50 mg by mouth every 6 (six) hours as needed for moderate pain.   Yes Historical Provider, MD  furosemide (LASIX) 20 MG tablet Take 1 tablet (20 mg total) by mouth as needed (sob). 01/31/16   Evans Lance, MD  spironolactone (ALDACTONE) 25 MG tablet Take 12.5 mg by mouth daily.    Historical Provider, MD  sulfamethoxazole-trimethoprim (BACTRIM DS,SEPTRA DS) 800-160 MG tablet Take 1 tablet by mouth 2 (two) times daily.    Historical Provider, MD   BP 122/78 mmHg  Pulse 70  Temp(Src) 98.6 F (37 C) (Oral)  Resp 16  SpO2 99% Physical Exam  Constitutional: He is oriented to person, place, and time. He appears well-developed and well-nourished.  nauseated  HENT:  Head: Normocephalic and atraumatic.  Nose: Nose normal.  Mouth/Throat: No oropharyngeal exudate.  Eyes: Conjunctivae are normal.  Neck: Normal range of motion. Neck supple. No JVD present. No tracheal deviation present.  Cardiovascular: Normal rate, regular rhythm and normal heart sounds.   No murmur heard. HR 50s  Pulmonary/Chest: Effort normal and breath sounds normal. No respiratory distress. He has no wheezes. He has no rales. He exhibits no tenderness.  Abdominal: Soft. Bowel sounds are normal. He exhibits no distension and no mass. There is no tenderness.  Musculoskeletal: Normal range of motion. He exhibits edema (trace on right.  ). He exhibits no tenderness.  Cast on left ankle  Neurological: He is alert and oriented to person, place, and time.  Skin: Skin is warm and dry. No rash noted.  Psychiatric: He has a normal mood and affect.  Nursing note and vitals reviewed.   ED Course  Procedures (including critical care time) Labs Review Labs Reviewed  CBC WITH DIFFERENTIAL/PLATELET - Abnormal; Notable for the following:    HCT 37.9 (*)    Neutro Abs 7.9 (*)    All other components within normal limits  COMPREHENSIVE METABOLIC PANEL - Abnormal; Notable  for the following:    Sodium 124 (*)    Chloride 88 (*)    Glucose, Bld 117 (*)    Albumin 3.1 (*)    Total Bilirubin 1.3 (*)    All other components within normal limits  CULTURE, BLOOD (ROUTINE X 2)  CULTURE, BLOOD (ROUTINE X 2)  CULTURE, EXPECTORATED SPUTUM-ASSESSMENT  GRAM STAIN  HIV ANTIBODY (ROUTINE TESTING)  STREP PNEUMONIAE URINARY ANTIGEN  I-STAT TROPOININ, ED  I-STAT CG4 LACTIC ACID, ED  I-STAT CG4 LACTIC ACID, ED    Imaging Review Dg Chest 2 View  03/02/2016  CLINICAL DATA:  Syncopal.  Headache.  Vomiting. EXAM: CHEST  2 VIEW COMPARISON:  None. FINDINGS: Pacer/AICD device which terminates at the right ventricle. Bilateral shoulder arthroplasties. Midline trachea. Mild cardiomegaly. Prior median  sternotomy. No pleural effusion or pneumothorax. No congestive failure. Patchy left lower lobe airspace disease IMPRESSION: Patchy left lower lobe airspace disease, suspicious for infection. Followup PA and lateral chest X-ray is recommended in 3-4 weeks following trial of antibiotic therapy to ensure resolution and exclude underlying malignancy. Electronically Signed   By: Abigail Miyamoto M.D.   On: 03/02/2016 16:59   Ct Head Wo Contrast  03/02/2016  CLINICAL DATA:  S/p fall today fell in shower did not hit head per pt. No loss consciousness EXAM: CT HEAD WITHOUT CONTRAST TECHNIQUE: Contiguous axial images were obtained from the base of the skull through the vertex without intravenous contrast. COMPARISON:  None. FINDINGS: Moderate diffuse age-related atrophy. Moderate low attenuation throughout the periventricular white matter. No focal attenuation change to suggest mass or vascular territory infarct. No hemorrhage or extra-axial fluid. No hydrocephalus. No skull fracture. IMPRESSION: Age-related involutional change with no acute findings Electronically Signed   By: Skipper Cliche M.D.   On: 03/02/2016 16:26   Ct Angio Chest Pe W/cm &/or Wo Cm  03/02/2016  CLINICAL DATA:  Acute onset of  syncope. Nausea, vomiting and dizziness. Initial encounter. EXAM: CT ANGIOGRAPHY CHEST WITH CONTRAST TECHNIQUE: Multidetector CT imaging of the chest was performed using the standard protocol during bolus administration of intravenous contrast. Multiplanar CT image reconstructions and MIPs were obtained to evaluate the vascular anatomy. CONTRAST:  90 mL of Isovue 370 IV contrast COMPARISON:  Chest radiograph performed earlier today at 4:34 p.m. FINDINGS: There is no evidence of pulmonary embolus. Patchy airspace opacification is noted at the left lower lobe, compatible with pneumonia. Minimal right basilar and right midlung opacity is also seen. There is no evidence of pleural effusion or pneumothorax. No masses are identified; no abnormal focal contrast enhancement is seen. Diffuse coronary artery calcifications are seen. The heart is mildly enlarged. An AICD is noted at the left chest wall, with associated lead ending at the right ventricle. The patient is status post median sternotomy. No pericardial effusion is identified. The great vessels are grossly unremarkable in appearance, aside from scattered calcification. No axillary lymphadenopathy is seen. The visualized portions of the thyroid gland are unremarkable in appearance. The visualized portions of the liver and spleen are unremarkable. The visualized portions of the pancreas, gallbladder, stomach and adrenal glands are within normal limits. No acute osseous abnormalities are seen. The patient's left shoulder arthroplasty is incompletely imaged on this study. Review of the MIP images confirms the above findings. IMPRESSION: 1. No evidence of pulmonary embolus. 2. Patchy left lower lobe airspace opacification, compatible with pneumonia. Minimal right basilar and right mid lung opacity also seen. 3. Diffuse coronary artery calcifications seen. 4. Mild cardiomegaly. Electronically Signed   By: Garald Balding M.D.   On: 03/02/2016 19:32   I have personally  reviewed and evaluated these images and lab results as part of my medical decision-making.   EKG Interpretation   Date/Time:  Monday Mar 02 2016 15:03:27 EDT Ventricular Rate:  61 PR Interval:  431 QRS Duration: 141 QT Interval:  493 QTC Calculation: 497 R Axis:   37 Text Interpretation:  Ventricular-paced complexes No further rhythm  analysis attempted due to paced rhythm Prolonged PR interval Probable left  atrial enlargement Nonspecific intraventricular conduction delay  Borderline repol abnrm, anterolateral leads paced.  Confirmed by Darl Householder  MD,  Rai ([id number]) on 03/02/2016 3:10:23 PM      MDM   Final diagnoses:  Syncope  Chronic systolic CHF (congestive heart failure) ([place])  Hyponatremia   High-risk syncope with CAD and CHF. Pacemaker evaluated without evidence of cardiac events. BP stable since arrival. Abdomen nontender, is without signs peritonitis have low suspicion for age or bowel pathology. He is not acidotic. EKG would be paced rhythm unchanged compared to prior.  He is somewhat hyponatremic, we'll give gentle IV fluid resuscitation. Chest x-ray with left lower lobe infiltrate. He does not have a clinical history of sputum cough or fever to correlate with pneumonia. Considered PE given left leg is immobilized for orthopedic procedure.  CTA PE study ordered and pending. Troponin unremarkable. Considered ACS will need delta troponins. Hemoglobin stable. CT head obtained due to vomiting and headache in the setting of syncope. This test was unremarkable for intra-cranial pathology.   Pacemaker interrogated without events.   CTA neg for PE on chart review. Interpreted by radiology, reviewed by myself. +LLL infiltrate.  Again no clinical sx to correlate.    Will admit to hospitalist for further medical management and workup of syncope of unclear etiology. Remained stable during course of care.     Tammy Sours, MD 03/02/16 Karl Bales  Wandra Arthurs, MD 03/04/16 2046

## 2016-03-02 NOTE — Progress Notes (Signed)
   03/02/16 2002  Vitals  Temp 99.5 F (37.5 C)  Temp Source Oral  BP 98/71 mmHg  BP Location Right Arm  BP Method Automatic  Patient Position (if appropriate) Lying  Pulse Rate 88  Pulse Rate Source Dinamap  Resp 18  Oxygen Therapy  SpO2 98 %  O2 Device Room Air  Height and Weight  Height 5\' 10"  (1.778 m)  Weight 95.573 kg (210 lb 11.2 oz) (bedscale due to pt foot in cast)  BSA (Calculated - sq m) 2.17 sq meters  BMI (Calculated) 30.3  Weight in (lb) to have BMI = 25 173.9  Admitted pt to rm 3E13 from ED, pt alert and oriented, denied pain at this time, oriented to room, call bell placed within reach, orders carried out, CCMD made aware.

## 2016-03-02 NOTE — ED Notes (Signed)
Pt. BIB [facility] for evaluation of syncopal episode today while in shower. Pt. Pale/diaphoretic/weak on EMS arrival but was able to get self up to toilet following event. Initial BP 80/40, given 500 ml NS, BP now 123/61. CBG 122. Pt. AxO x4. Pt. Does have demand pacer.

## 2016-03-02 NOTE — H&P (Signed)
[facility] History and Physical  Stephen Adams [id number] DOB: 02-28-1946 DOA: 03/02/2016  PCP: Henrine Screws, MD  Patient coming from: home  Chief Complaint: Syncope  HPI: Stephen Adams is a 70 y.o. male with a medical history of ischemic cardiomyopathy, systolic heart failure, coronary artery disease, hypertension, presented to the emergency department with complaints of passing out. Patient was in his shower this morning when he felt unwell, he was sitting on shower seat. He had several episodes of nausea followed by vomiting. Patient's wife stated that she try to hold him up and he passed out. Patient did not hit his head. He stated he felt sweaty, dizzy, followed by chills. Per wife, patient passed out for approximate 10 seconds.  He remained confused for several seconds however returned to normal mentation. He denies any shortness of breath, chest pain prior to the episode. He does state he used tramadol at night before. Patient denied any palpitations. He denies any similar episodes in the past. Denies any recent travel, illness or silk contacts. Denies any abdominal pain, diarrhea or constipation, problems with urination. Patient was supposed to [facility] to [facility] for bunion surgery and removal of left leg cast. Patient does currently complain of headache which she states is dull and all over his head. Denies any visual changes. [facility] called for admission.  ED Course: Patient found to be dehydrated. CT chest for PE protocol ordered. Chest x-ray showed questionable pneumonia. ICD interrogated.  Review of Systems:  As per HPI otherwise 10 point review of systems negative.   Past Medical History  Diagnosis Date  . Hypertensive heart disease   . Hyperlipidemia   . CAD (coronary artery disease)     a. 1989 PTCA LAD; b. 1999 BMS to LAD & RCA; c. 2000 PTCA RCA 2/2 ISR; d. 10/1999 Inf MI/VF Arrest: 4 stents to RCA 2/2 ISR; e. 05/2001 Cath: ISR RCA, sev LAD dzs, EF  35-40%-->CABG (details unknown); f. 09/2013 MV: Inferior infarct, no ischemia.  . Ischemic cardiomyopathy     a. 02/2015 Echo: EF 25-30%, inf, apical AK, mid-apical-anteroseptal and ant HK, Gr1 DD, mild AI, mod dil LA.  . 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 10/2008 s/p MDT [employer] single lead AICD. Ser # [id number] H.  . Chronic systolic CHF (congestive heart failure) ([place])     a. 02/2015 Echo: EF 25-30%.  . DJD (degenerative joint disease)     Past Surgical History  Procedure Laterality Date  . Total shoulder replacement Bilateral   . Total knee arthroplasty Left   . Coronary artery bypass graft      4 vessel [facility].  Dr. Burns [place] 06/2000    Social History:  reports that he has never smoked. He does not have any smokeless tobacco history on file. His alcohol and drug histories are not on file.   No Known Allergies  Family History  Problem Relation Age of Onset  . Hypertension Father   . Heart Problems Father     bypass 1992  . CAD Mother   . Pancreatic cancer Mother   . Heart Problems Maternal Grandfather     Prior to Admission medications   Medication Sig Start Date End Date Taking? Authorizing Provider  aspirin 81 MG tablet Take 81 mg by mouth daily.   Yes Historical Provider, MD  carvedilol (COREG) 6.25 MG tablet TAKE 1 TABLET BY MOUTH TWICE A DAY 11/21/15  Yes Minus Breeding, MD  irbesartan-hydrochlorothiazide (AVALIDE) 150-12.5 MG tablet TAKE 1  TABLET BY MOUTH EVERY DAY 11/21/15  Yes Minus Breeding, MD  lovastatin (MEVACOR) 20 MG tablet Take 1 tablet (20 mg total) by mouth at bedtime. 01/24/16  Yes Minus Breeding, MD  pantoprazole (PROTONIX) 40 MG tablet TAKE 1 TABLET BY MOUTH EVERY DAY 11/29/15  Yes Minus Breeding, MD  traMADol (ULTRAM) 50 MG tablet Take 50 mg by mouth every 6 (six) hours as needed for moderate pain.   Yes Historical Provider, MD  furosemide (LASIX) 20 MG tablet Take 1 tablet (20 mg total) by mouth as needed (sob).  01/31/16   Evans Lance, MD  spironolactone (ALDACTONE) 25 MG tablet Take 12.5 mg by mouth daily.    Historical Provider, MD  sulfamethoxazole-trimethoprim (BACTRIM DS,SEPTRA DS) 800-160 MG tablet Take 1 tablet by mouth 2 (two) times daily.    Historical Provider, MD    Physical Exam: Filed Vitals:   03/02/16 1545 03/02/16 1700  BP: 114/85 131/89  Pulse: 61 58  Temp:    Resp: 14 20     General: Well developed, well nourished, NAD, appears stated age  HEENT: NCAT, PERRLA, EOMI, Anicteic Sclera, mucous membranes dry  Neck: Supple, no JVD, no masses  Cardiovascular: S1 S2 auscultated, no rubs, murmurs or gallops. Regular rate and rhythm.  Respiratory: Clear to auscultation bilaterally with equal chest rise  Abdomen: Soft, nontender, nondistended, + bowel sounds  Extremities: warm dry without cyanosis clubbing or edema, LLE in cast  Neuro: AAOx3, cranial nerves grossly intact. Strength 5/5 in patient's upper and lower extremities bilaterally  Skin: Without rashes exudates or nodules  Psych: Normal affect and demeanor with intact judgement and insight  Labs on Admission: I have personally reviewed following labs and imaging studies CBC:  Recent Labs Lab 03/02/16 1545  WBC 9.9  NEUTROABS 7.9*  HGB 13.5  HCT 37.9*  MCV 86.5  PLT 99991111   Basic Metabolic Panel:  Recent Labs Lab 03/02/16 1545  NA 124*  K 4.7  CL 88*  CO2 26  GLUCOSE 117*  BUN 11  CREATININE 0.96  CALCIUM 9.1   GFR: CrCl cannot be calculated (Unknown ideal weight.). Liver Function Tests:  Recent Labs Lab 03/02/16 1545  AST 26  ALT 24  ALKPHOS 67  BILITOT 1.3*  PROT 6.7  ALBUMIN 3.1*   No results for input(s): LIPASE, AMYLASE in the last 168 hours. No results for input(s): AMMONIA in the last 168 hours. Coagulation Profile: No results for input(s): INR, PROTIME in the last 168 hours. Cardiac Enzymes: No results for input(s): CKTOTAL, CKMB, CKMBINDEX, TROPONINI in the last 168  hours. BNP (last 3 results) No results for input(s): PROBNP in the last 8760 hours. HbA1C: No results for input(s): HGBA1C in the last 72 hours. CBG: No results for input(s): GLUCAP in the last 168 hours. Lipid Profile: No results for input(s): CHOL, HDL, LDLCALC, TRIG, CHOLHDL, LDLDIRECT in the last 72 hours. Thyroid Function Tests: No results for input(s): TSH, T4TOTAL, FREET4, T3FREE, THYROIDAB in the last 72 hours. Anemia Panel: No results for input(s): VITAMINB12, FOLATE, FERRITIN, TIBC, IRON, RETICCTPCT in the last 72 hours. Urine analysis: No results found for: COLORURINE, APPEARANCEUR, LABSPEC, PHURINE, GLUCOSEU, HGBUR, BILIRUBINUR, KETONESUR, PROTEINUR, UROBILINOGEN, NITRITE, LEUKOCYTESUR Sepsis Labs: @LABRCNTIP (procalcitonin:4,lacticidven:4) )No results found for this or any previous visit (from the past 240 hour(s)).   Radiological Exams on Admission: Dg Chest 2 View  03/02/2016  CLINICAL DATA:  Syncopal.  Headache.  Vomiting. EXAM: CHEST  2 VIEW COMPARISON:  None. FINDINGS: Pacer/AICD device which terminates at  the right ventricle. Bilateral shoulder arthroplasties. Midline trachea. Mild cardiomegaly. Prior median sternotomy. No pleural effusion or pneumothorax. No congestive failure. Patchy left lower lobe airspace disease IMPRESSION: Patchy left lower lobe airspace disease, suspicious for infection. Followup PA and lateral chest X-ray is recommended in 3-4 weeks following trial of antibiotic therapy to ensure resolution and exclude underlying malignancy. Electronically Signed   By: Abigail Miyamoto M.D.   On: 03/02/2016 16:59   Ct Head Wo Contrast  03/02/2016  CLINICAL DATA:  S/p fall today fell in shower did not hit head per pt. No loss consciousness EXAM: CT HEAD WITHOUT CONTRAST TECHNIQUE: Contiguous axial images were obtained from the base of the skull through the vertex without intravenous contrast. COMPARISON:  None. FINDINGS: Moderate diffuse age-related atrophy. Moderate low  attenuation throughout the periventricular white matter. No focal attenuation change to suggest mass or vascular territory infarct. No hemorrhage or extra-axial fluid. No hydrocephalus. No skull fracture. IMPRESSION: Age-related involutional change with no acute findings Electronically Signed   By: Skipper Cliche M.D.   On: 03/02/2016 16:26    EKG: Independently reviewed. Ventricularly paced, rate 61  Assessment/Plan Syncope -Patient be admitted to telemetry to monitor for any arrhythmia -CT head: Age-related involutional change with no acute findings. -ICD interrogated in the emergency department, no events noted -Obtain echocardiogram, carotid Doppler, orthostatic vitals on admission -Given patient's immobility over the past month, will obtain CT chest for PE  Chronic Systolic CHF/ischemic heart disease -S/p ICD interrogation  -Patient no longer taking spironolactone ? -Lasix held, patient appears to be dehydrated -Monitor intake/output, daily weights -Echocardiogram 03/05/2015 shows EF [id number], grade 1 diastolic dysfunction, mild aortic valve regurgitation directed eccentrically in the LVOT -Will obtain repeat echocardiogram -Last seen by cardiology (follows with Dr. Lovena Le and [facility]) for 01/31/2016  Dehydration, hyponatremia and hypochloremia -Likely secondary to nausea and vomiting -Continue to monitor BMP, placed on gentle IV fluids  ?Aspiration -Possibly due to vomiting -Chest x-ray shows patchy left lower lobe airspace disease suspicious for infection -CT chest -Patient currently afebrile and no leukocytosis, will hold off on antibiotics  Hypertension -Blood pressure currently normotensive, will hold Coreg, Avalide, Lasix  History of coronary artery disease -Hold aspirin for surgery, hold Coreg, Avalide, Lasix -Continue statin -No complaints of chest pain, troponin currently 0  Recent history of left ankle surgery -Patient wants to have the cast removed on left  lower extremity with bunion surgery tomorrow 03/03/2016 [facility] -He was recently evaluated by cardiology in April 2017 and found to be low risk -Patient has been holding his aspirin for surgery  DVT prophylaxis: Lovenox  Code Status: Full  Family Communication: Wife at bedside. Admission, patients condition and plan of care including tests being ordered have been discussed with the patient and wife who indicate understanding and agree with the plan and Code Status.  Disposition Plan: Admitted to telemetry   Consults called: Cardiology   Admission status: Observation   Time spent: 60 minutes  Ryden Wainer D.O. [facility] Pager [phone]  If 7PM-7AM, please contact night-coverage [email or web address] Password [facility] 03/02/2016, 6:03 PM

## 2016-03-02 NOTE — Progress Notes (Signed)
Pharmacy Antibiotic Note  Stephen Adams is a 70 y.o. male admitted on 03/02/2016 with pneumonia.  Pharmacy has been consulted for vancomycin dosing. Pt is afebrile and WBC is WNL. SCr is 0.96 and lactic acid is 1.3.   Plan: - Vancomycin 2gm IV x 1 then 1250mg  IV Q12H - F/u renal fxn, C&S, clinical status and trough at SS     Temp (24hrs), Avg:98.8 F (37.1 C), Min:98.6 F (37 C), Max:98.9 F (37.2 C)   Recent Labs Lab 03/02/16 1545 03/02/16 1729  WBC 9.9  --   CREATININE 0.96  --   LATICACIDVEN  --  1.30    CrCl cannot be calculated (Unknown ideal weight.).    No Known Allergies  Antimicrobials this admission: Vanc 5/22>> Cefepime 5/22>>  Dose adjustments this admission: N/A  Microbiology results: Pending  Thank you for allowing pharmacy to be a part of this patient's care.  Naiah Donahoe, Rande Lawman 03/02/2016 7:51 PM

## 2016-03-02 NOTE — Consult Note (Signed)
CARDIOLOGY CONSULT NOTE   Patient ID: Stephen Adams MRN: [id number], DOB/AGE: 1945-11-22   Admit date: 03/02/2016 Date of Consult: 03/02/2016   Primary Physician: Henrine Screws, MD Primary Cardiologist: Dr. Percival Spanish / Dr. Lovena Le  Pt. Profile  Stephen Adams is a pleasant 70 year old Caucasian male with past medical history of CAD s/p CABG 2002, hypertension, hyperlipidemia, and ICM with baseline EF 25-30% s/p Medtronic ICD presented with LLL PNA, dehydration and syncope after vomiting.  Problem List  Past Medical History  Diagnosis Date  . Hypertensive heart disease   . Hyperlipidemia   . CAD (coronary artery disease)     a. 1989 PTCA LAD; b. 1999 BMS to LAD & RCA; c. 2000 PTCA RCA 2/2 ISR; d. 10/1999 Inf MI/VF Arrest: 4 stents to RCA 2/2 ISR; e. 05/2001 Cath: ISR RCA, sev LAD dzs, EF 35-40%-->CABG (details unknown); f. 09/2013 MV: Inferior infarct, no ischemia.  . Ischemic cardiomyopathy     a. 02/2015 Echo: EF 25-30%, inf, apical AK, mid-apical-anteroseptal and ant HK, Gr1 DD, mild AI, mod dil LA.  . 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 10/2008 s/p MDT [employer] DR single lead AICD. Ser # [id number] H.  . Chronic systolic CHF (congestive heart failure) ([place])     a. 02/2015 Echo: EF 25-30%.  . DJD (degenerative joint disease)     Past Surgical History  Procedure Laterality Date  . Total shoulder replacement Bilateral   . Total knee arthroplasty Left   . Coronary artery bypass graft      4 vessel [facility].  Dr. Burns Spain 06/2000     Allergies  No Known Allergies  HPI   Stephen Adams is a pleasant 70 year old Caucasian male with past medical history of CAD s/p CABG 2002, hypertension, hyperlipidemia, and ICM with baseline EF 25-30% s/p Medtronic ICD. He has been followed by both Dr. Jodi Mourning and Dr. Lovena Le. It appears he has not had any intervention since 2002. His last Myoview in 2014 was in [place] that showed large inferior  infarct, however no ischemia. His last echocardiogram obtained on 03/04/2015 showed EF 25-30%, akinesis and scarring of the entire inferior myocardium, consistent with infarction in the RCA distribution, akinesis of apical myocardium consistent with infarction in the distribution of LAD, hypokinesis of mid apical anteroseptal and anterior myocardium, grade 1 diastolic dysfunction, mild AR, moderately dilated left atrium. He was seen in the cardiology office in April 2017 at that time, his ICD has reached ERI on previous interrogation. After discussing with Dr. Lovena Le, the plan was to clear him for surgery, and once he recovers from his ankle surgery, we will plan to have ICD generator change.  For the past several days, patient has been having chills and malaise. He was taking shower when his symptom continued to worsen. Per patient's wife, patient initially hunched over, but when his wife was trying to bring his head up, he had nausea and subsequent vomiting. He subsequently passed out for approximately 10 seconds. He denies any shortness breath or chest pain prior to the episode. He was originally supposed to go back to [facility] tomorrow for bunion surgery. He was admitted to hospitalist service at [facility]. Initial CT of the head was negative for acute process. CTA of the chest however shows left lower lobe pneumonia. His ICD has been interrogated, it does show a roughly 3 minutes of VT episode in January 2017, however there is no acute event this morning. He denies getting shocked by his  ICD. He was admitted to the hospitalist service for treatment of pneumonia and dehydration. Cardiology has been consulted for syncope.   Inpatient Medications  . ceFEPime (MAXIPIME) IV  1 g Intravenous Q8H  . enoxaparin (LOVENOX) injection  40 mg Subcutaneous Q24H  . [START ON 03/03/2016] pantoprazole  40 mg Oral Daily  . [START ON 03/03/2016] pravastatin  20 mg Oral q1800  . sodium chloride flush  3 mL  Intravenous Q12H  . [START ON 03/03/2016] vancomycin  1,250 mg Intravenous Q12H  . vancomycin  2,000 mg Intravenous Once    Family History Family History  Problem Relation Age of Onset  . Hypertension Father   . Heart Problems Father     bypass 1992  . CAD Mother   . Pancreatic cancer Mother   . Heart Problems Maternal Grandfather      Social History Social History   Social History  . Marital Status: Married    Spouse Name: N/A  . Number of Children: 4  . Years of Education: N/A   Occupational History  . Beef Cattle Farmer    Social History Main Topics  . Smoking status: Never Smoker   . Smokeless tobacco: Not on file  . Alcohol Use: Not on file  . Drug Use: Not on file  . Sexual Activity: Not on file   Other Topics Concern  . Not on file   Social History Narrative   Lives with wife and pony and draft horses.       Review of Systems  General:  No chills, fever, night sweats or weight changes.  Cardiovascular:  No chest pain, dyspnea on exertion, edema, orthopnea, palpitations, paroxysmal nocturnal dyspnea. Dermatological: No rash, lesions/masses Respiratory: No cough, dyspnea Urologic: No hematuria, dysuria Abdominal:   No nausea, vomiting, diarrhea, bright red blood per rectum, melena, or hematemesis Neurologic:  No visual changes. Synocope All other systems reviewed and are otherwise negative except as noted above.  Physical Exam  Blood pressure 98/71, pulse 88, temperature 99.5 F (37.5 C), temperature source Oral, resp. rate 18, height 5\' 10"  (1.778 m), weight 210 lb 11.2 oz (95.573 kg), SpO2 98 %.  General: Pleasant, NAD Psych: Normal affect. Neuro: Alert and oriented X 3. Moves all extremities spontaneously. HEENT: Normal  Neck: Supple without bruits or JVD. Lungs:  Resp regular and unlabored, anterior exam CTA. ICD noted in the left upper pectoral space Heart: RRR no s3, s4, or murmurs. Abdomen: Soft, non-tender, non-distended, BS + x 4.    Extremities: No clubbing, cyanosis or edema. Left lower extremity in cast  Labs  No results for input(s): CKTOTAL, CKMB, TROPONINI in the last 72 hours. Lab Results  Component Value Date   WBC 9.9 03/02/2016   HGB 13.5 03/02/2016   HCT 37.9* 03/02/2016   MCV 86.5 03/02/2016   PLT 339 03/02/2016    Recent Labs Lab 03/02/16 1545  NA 124*  K 4.7  CL 88*  CO2 26  BUN 11  CREATININE 0.96  CALCIUM 9.1  PROT 6.7  BILITOT 1.3*  ALKPHOS 67  ALT 24  AST 26  GLUCOSE 117*   Lab Results  Component Value Date   CHOL 141 08/19/2015   HDL 24* 08/19/2015   LDLCALC 104 08/19/2015   TRIG 64 08/19/2015   No results found for: DDIMER  Radiology/Studies  Dg Chest 2 View  03/02/2016  CLINICAL DATA:  Syncopal.  Headache.  Vomiting. EXAM: CHEST  2 VIEW COMPARISON:  None. FINDINGS: Pacer/AICD device which terminates  at the right ventricle. Bilateral shoulder arthroplasties. Midline trachea. Mild cardiomegaly. Prior median sternotomy. No pleural effusion or pneumothorax. No congestive failure. Patchy left lower lobe airspace disease IMPRESSION: Patchy left lower lobe airspace disease, suspicious for infection. Followup PA and lateral chest X-ray is recommended in 3-4 weeks following trial of antibiotic therapy to ensure resolution and exclude underlying malignancy. Electronically Signed   By: Abigail Miyamoto M.D.   On: 03/02/2016 16:59   Ct Head Wo Contrast  03/02/2016  CLINICAL DATA:  S/p fall today fell in shower did not hit head per pt. No loss consciousness EXAM: CT HEAD WITHOUT CONTRAST TECHNIQUE: Contiguous axial images were obtained from the base of the skull through the vertex without intravenous contrast. COMPARISON:  None. FINDINGS: Moderate diffuse age-related atrophy. Moderate low attenuation throughout the periventricular white matter. No focal attenuation change to suggest mass or vascular territory infarct. No hemorrhage or extra-axial fluid. No hydrocephalus. No skull fracture.  IMPRESSION: Age-related involutional change with no acute findings Electronically Signed   By: Skipper Cliche M.D.   On: 03/02/2016 16:26   Ct Angio Chest Pe W/cm &/or Wo Cm  03/02/2016  CLINICAL DATA:  Acute onset of syncope. Nausea, vomiting and dizziness. Initial encounter. EXAM: CT ANGIOGRAPHY CHEST WITH CONTRAST TECHNIQUE: Multidetector CT imaging of the chest was performed using the standard protocol during bolus administration of intravenous contrast. Multiplanar CT image reconstructions and MIPs were obtained to evaluate the vascular anatomy. CONTRAST:  90 mL of Isovue 370 IV contrast COMPARISON:  Chest radiograph performed earlier today at 4:34 p.m. FINDINGS: There is no evidence of pulmonary embolus. Patchy airspace opacification is noted at the left lower lobe, compatible with pneumonia. Minimal right basilar and right midlung opacity is also seen. There is no evidence of pleural effusion or pneumothorax. No masses are identified; no abnormal focal contrast enhancement is seen. Diffuse coronary artery calcifications are seen. The heart is mildly enlarged. An AICD is noted at the left chest wall, with associated lead ending at the right ventricle. The patient is status post median sternotomy. No pericardial effusion is identified. The great vessels are grossly unremarkable in appearance, aside from scattered calcification. No axillary lymphadenopathy is seen. The visualized portions of the thyroid gland are unremarkable in appearance. The visualized portions of the liver and spleen are unremarkable. The visualized portions of the pancreas, gallbladder, stomach and adrenal glands are within normal limits. No acute osseous abnormalities are seen. The patient's left shoulder arthroplasty is incompletely imaged on this study. Review of the MIP images confirms the above findings. IMPRESSION: 1. No evidence of pulmonary embolus. 2. Patchy left lower lobe airspace opacification, compatible with pneumonia.  Minimal right basilar and right mid lung opacity also seen. 3. Diffuse coronary artery calcifications seen. 4. Mild cardiomegaly. Electronically Signed   By: Garald Balding M.D.   On: 03/02/2016 19:32    ECG  EKG shows possibly paced rhythm with ST elevation in the inferior lead T-wave inversion in the lateral leads unchanged compared to the previous EKG  ASSESSMENT AND PLAN  1. Syncope in the setting of left lower lobe pneumonia and vomiting  - ICD was interrogated in the ED which did not reveal any arrhythmic episode that can induce syncope spells. Likely cause is in the setting of pneumonia and vomiting which can potentially trigger a vasovagal episode. We'll continue to monitor overnight on telemetry, if there is no significant events overnight, would not pursue any further workup.  2. ICM with baseline EF 25-30% s/p  Medtronic ICD  3. Recent L ankle surgery with plan for bunion surgery at [facility] on 5/23  - upcoming surgery will be delayed  4. CAD s/p CABG 2002  5. Hypertension  6. hyperlipidemia  Signed, Almyra Deforest, PA-C 03/02/2016, 8:28 PM    Attending note:  Patient seen and examined. Reviewed records and discussed the case with Mr. Eulah Citizen. Mr. Nilo is admitted to the hospital after an episode of syncope. Recent history includes left ankle surgery at [facility] approximately one month ago, actually he was pending repeat surgery on that foot tomorrow at [facility]. He states that he has had a cough over the last few days, complaining of malaise, sleeping a lot, very fatigued. He was seated in the shower, assisted by his wife as he still is not able to ambulate freely with a cast on his left foot. He felt "very bad at the time" and his wife states that he tilted his head back and had an episode of emesis, was not responsive for about 10 seconds and then came around. She called 911. He has been diagnosed with a possible left lower lobe pneumonia based on chest CT. He denies any palpitations or  chest pain, no device shocks with Medtronic ICD in place for ischemic cardiomyopathy. Device was interrogated today, no recent events were documented. He does have an anticipated elective generator change in July with Dr. Lovena Le. Of note, he reports having some shaking chills earlier after admission to the hospital, but he has been afebrile.  On examination now he states that he feels "1000%" better. Temperature 99.5 degrees, heart rate in the 80s, blood pressure 98/71, lungs exhibit diminished breath sounds with rhonchi at the base on the left, cardiac exam with RRR no gallop. Lab work shows lactic acid 1.3, creatinine 0.9, hemoglobin 13.5, WBC 9.9, blood cultures sent and pending. ECG shows intermittent ventricular paced beats with evidence of old inferior and anterior infarcts, residual ST-T wave abnormalities.  Episode of syncope, description and features sound consistent with possible vagovagal etiology. He has a concurrently diagnosed left lower lobe pneumonia, recently reported malaise and cough, currently afebrile however with blood cultures pending. No arrhythmia events were documented by device interrogation. At this point recommend continued telemetry monitoring, doubt that further cardiac workup will be needed unless the situation changes.  Satira Sark, M.D., F.A.C.C.

## 2016-03-03 ENCOUNTER — Observation Stay (HOSPITAL_BASED_OUTPATIENT_CLINIC_OR_DEPARTMENT_OTHER): Payer: Medicare Other

## 2016-03-03 ENCOUNTER — Observation Stay (HOSPITAL_COMMUNITY): Payer: Medicare Other

## 2016-03-03 DIAGNOSIS — I5022 Chronic systolic (congestive) heart failure: Secondary | ICD-10-CM

## 2016-03-03 DIAGNOSIS — R55 Syncope and collapse: Secondary | ICD-10-CM

## 2016-03-03 DIAGNOSIS — I2510 Atherosclerotic heart disease of native coronary artery without angina pectoris: Secondary | ICD-10-CM | POA: Diagnosis present

## 2016-03-03 DIAGNOSIS — E871 Hypo-osmolality and hyponatremia: Secondary | ICD-10-CM | POA: Diagnosis not present

## 2016-03-03 DIAGNOSIS — T502X5A Adverse effect of carbonic-anhydrase inhibitors, benzothiadiazides and other diuretics, initial encounter: Secondary | ICD-10-CM | POA: Diagnosis present

## 2016-03-03 DIAGNOSIS — L03116 Cellulitis of left lower limb: Secondary | ICD-10-CM | POA: Diagnosis not present

## 2016-03-03 DIAGNOSIS — Z96611 Presence of right artificial shoulder joint: Secondary | ICD-10-CM | POA: Diagnosis present

## 2016-03-03 DIAGNOSIS — E785 Hyperlipidemia, unspecified: Secondary | ICD-10-CM | POA: Diagnosis not present

## 2016-03-03 DIAGNOSIS — E878 Other disorders of electrolyte and fluid balance, not elsewhere classified: Secondary | ICD-10-CM | POA: Diagnosis present

## 2016-03-03 DIAGNOSIS — Z96652 Presence of left artificial knee joint: Secondary | ICD-10-CM | POA: Diagnosis present

## 2016-03-03 DIAGNOSIS — I255 Ischemic cardiomyopathy: Secondary | ICD-10-CM | POA: Diagnosis not present

## 2016-03-03 DIAGNOSIS — Z95810 Presence of automatic (implantable) cardiac defibrillator: Secondary | ICD-10-CM | POA: Diagnosis not present

## 2016-03-03 DIAGNOSIS — Z7982 Long term (current) use of aspirin: Secondary | ICD-10-CM | POA: Diagnosis not present

## 2016-03-03 DIAGNOSIS — J189 Pneumonia, unspecified organism: Secondary | ICD-10-CM | POA: Diagnosis present

## 2016-03-03 DIAGNOSIS — I110 Hypertensive heart disease with heart failure: Secondary | ICD-10-CM | POA: Diagnosis present

## 2016-03-03 DIAGNOSIS — Z96612 Presence of left artificial shoulder joint: Secondary | ICD-10-CM | POA: Diagnosis present

## 2016-03-03 DIAGNOSIS — E860 Dehydration: Secondary | ICD-10-CM | POA: Diagnosis present

## 2016-03-03 LAB — CBC
HEMATOCRIT: 36.5 % — AB (ref 39.0–52.0)
Hemoglobin: 13 g/dL (ref 13.0–17.0)
MCH: 30.4 pg (ref 26.0–34.0)
MCHC: 35.6 g/dL (ref 30.0–36.0)
MCV: 85.5 fL (ref 78.0–100.0)
Platelets: 331 10*3/uL (ref 150–400)
RBC: 4.27 MIL/uL (ref 4.22–5.81)
RDW: 12.1 % (ref 11.5–15.5)
WBC: 16.5 10*3/uL — AB (ref 4.0–10.5)

## 2016-03-03 LAB — BASIC METABOLIC PANEL
Anion gap: 7 (ref 5–15)
BUN: 10 mg/dL (ref 6–20)
CHLORIDE: 88 mmol/L — AB (ref 101–111)
CO2: 28 mmol/L (ref 22–32)
CREATININE: 1.1 mg/dL (ref 0.61–1.24)
Calcium: 8.7 mg/dL — ABNORMAL LOW (ref 8.9–10.3)
GFR calc Af Amer: >60 mL/min (ref >60)
GFR calc non Af Amer: >60 mL/min (ref >60)
GLUCOSE: 122 mg/dL — AB (ref 65–99)
POTASSIUM: 4.9 mmol/L (ref 3.5–5.1)
Sodium: 123 mmol/L — ABNORMAL LOW (ref 135–145)

## 2016-03-03 LAB — ECHOCARDIOGRAM COMPLETE
Height: 70 in
WEIGHTICAEL: 3403.2 [oz_av]

## 2016-03-03 LAB — TROPONIN I
TROPONIN I: 0.03 ng/mL (ref <0.031)
Troponin I: <0.03 ng/mL (ref <0.031)

## 2016-03-03 LAB — HIV ANTIBODY (ROUTINE TESTING W REFLEX): HIV SCREEN 4TH GENERATION: NONREACTIVE

## 2016-03-03 LAB — STREP PNEUMONIAE URINARY ANTIGEN: STREP PNEUMO URINARY ANTIGEN: NEGATIVE

## 2016-03-03 LAB — GLUCOSE, CAPILLARY: GLUCOSE-CAPILLARY: 100 mg/dL — AB (ref 65–99)

## 2016-03-03 NOTE — Progress Notes (Signed)
PROGRESS NOTE    Stephen Adams  [id number] DOB: Dec 13, 1945 DOA: 03/02/2016 PCP: Henrine Screws, MD   Brief Narrative:  Stephen Adams is a 70 y.o. male with a medical history of ischemic cardiomyopathy, systolic heart failure, coronary artery disease, hypertension, presented to the emergency department with complaints of passing out. Admitted for syncope. Cardiology consulted. Pending echo. Found to have pneumonia, currently on vanc/cefepime.    Assessment & Plan   Syncope -Possibly vasovagal -CT head: Age-related involutional change with no acute findings. -ICD interrogated in the emergency department, no events noted -CTA chest negative PE -Cardiology consulted and appreciated, no further cardiac workup needed at this time -Pending echo/carotid doppler -Follow up with Dr. Laury Deep  Pneumonia -Community acquired vs aspiration -Unsure if pneumonia led to patient's syncope vs vomiting leading to apsiration -Started on vancomycin and cefepime -Strep pneumonia urine antigen negative -patient afebrile, however developed leukocytosis overnight -Would likely transition to doxycycline/augmentin in the next 24 hours -Blood cultures pending  Left leg cellulitis -Orthopedic surgery, Dr. Erlinda Hong consulted and appreciated- recommended replacing cast with follow up with [facility] -Ortho tech removed LLE cast -cellulitis noted, patient completed course of bactrim -Currently vanc/cefepime should be adequate coverage -Would recommended continuing IV antibiotics for additional 24 hours and transitioning to doxycycline and augmentin  Chronic Systolic CHF/ischemic heart disease  -S/p ICD interrogation  -Patient no longer taking spironolactone ? -Lasix held, patient appears to be dehydrated -Monitor intake/output, daily weights -Echocardiogram 03/05/2015 shows EF [id number], grade 1 diastolic dysfunction, mild aortic valve regurgitation directed eccentrically in the LVOT -Repeat  Echocardiogram pending  -Last seen by cardiology (follows with Dr. Lovena Le and [facility]) for 01/31/2016  Dehydration, hyponatremia and hypochloremia -Likely secondary to nausea and vomiting -Continue to monitor BMP, placed on gentle IV fluids  Hypertension -Blood pressure currently normotensive, will hold Coreg, Avalide, Lasix  History of coronary artery disease -Hold aspirin for surgery, hold Coreg, Avalide, Lasix -Continue statin -No complaints of chest pain, troponin currently 0  Recent history of left ankle surgery -Patient wants to have the cast removed on left lower extremity with bunion surgery tomorrow 03/03/2016 at [facility] -He was recently evaluated by cardiology in April 2017 and found to be low risk -Patient has been holding his aspirin for surgery -See discussion above.   DVT Prophylaxis  Lovenox  Code Status: Full  Family Communication: None at bedside  Disposition Plan: Admitted for observation  Consultants Cardiology Orthopedic surgery, Dr. Erlinda Hong  Procedures  None  Antibiotics   Anti-infectives    Start     Dose/Rate Route Frequency Ordered Stop   03/03/16 0800  vancomycin (VANCOCIN) 1,250 mg in sodium chloride 0.9 % 250 mL IVPB     1,250 mg 166.7 mL/hr over 90 Minutes Intravenous Every 12 hours 03/02/16 1951     03/02/16 2000  ceFEPIme (MAXIPIME) 1 g in dextrose 5 % 50 mL IVPB     1 g 100 mL/hr over 30 Minutes Intravenous Every 8 hours 03/02/16 1946 03/10/16 2159   03/02/16 2000  vancomycin (VANCOCIN) 2,000 mg in sodium chloride 0.9 % 500 mL IVPB     2,000 mg 250 mL/hr over 120 Minutes Intravenous  Once 03/02/16 1948 03/02/16 2328      Subjective:   Stephen Adams seen and examined today.  Patient states he is feeling better but continues to feel weak.  Denies further chills, nausea or vomiting. Denies chest pain, shortness of breath, abdominal pain, dizziness, headache.    Objective:   Filed Vitals:  03/03/16 0037 03/03/16 [id number] 03/03/16  0641 03/03/16 0953  BP: 93/61 110/78  90/69  Pulse: 73 55  58  Temp: 98.7 F (37.1 C) 98.4 F (36.9 C)  97.7 F (36.5 C)  TempSrc: Oral Oral  Oral  Resp: 19 20  18   Height:      Weight:   96.48 kg (212 lb 11.2 oz)   SpO2: 97%   99%    Intake/Output Summary (Last 24 hours) at 03/03/16 1226 Last data filed at 03/03/16 0951  Gross per 24 hour  Intake   2150 ml  Output    750 ml  Net   1400 ml   Filed Weights   03/02/16 2002 03/03/16 0641  Weight: 95.573 kg (210 lb 11.2 oz) 96.48 kg (212 lb 11.2 oz)    Exam  General: Well developed, well nourished, NAD, appears stated age  73: NCAT, mucous membranes moist.   Cardiovascular: S1 S2 auscultated, no murmurs, RRR  Respiratory: Clear to auscultation bilaterally  Abdomen: Soft, nontender, nondistended, + bowel sounds  Extremities: warm dry without cyanosis clubbing or edema- LLE erythema  Neuro: AAOx3, nonfocal  Psych: Appropriate mood and affect  Data Reviewed: I have personally reviewed following labs and imaging studies  CBC:  Recent Labs Lab 03/02/16 1545 03/03/16 0148  WBC 9.9 16.5*  NEUTROABS 7.9*  --   HGB 13.5 13.0  HCT 37.9* 36.5*  MCV 86.5 85.5  PLT 339 [id number]   Basic Metabolic Panel:  Recent Labs Lab 03/02/16 1545 03/02/16 2010 03/03/16 0148  NA 124*  --  123*  K 4.7  --  4.9  CL 88*  --  88*  CO2 26  --  28  GLUCOSE 117*  --  122*  BUN 11  --  10  CREATININE 0.96  --  1.10  CALCIUM 9.1  --  8.7*  MG  --  1.4*  --    GFR: Estimated Creatinine Clearance: 73.9 mL/min (by C-G formula based on Cr of 1.1). Liver Function Tests:  Recent Labs Lab 03/02/16 1545  AST 26  ALT 24  ALKPHOS 67  BILITOT 1.3*  PROT 6.7  ALBUMIN 3.1*   No results for input(s): LIPASE, AMYLASE in the last 168 hours. No results for input(s): AMMONIA in the last 168 hours. Coagulation Profile: No results for input(s): INR, PROTIME in the last 168 hours. Cardiac Enzymes:  Recent Labs Lab 03/02/16 2010  03/03/16 0148 03/03/16 0736  TROPONINI <0.03 <0.03 0.03   BNP (last 3 results) No results for input(s): PROBNP in the last 8760 hours. HbA1C: No results for input(s): HGBA1C in the last 72 hours. CBG:  Recent Labs Lab 03/03/16 0609  GLUCAP 100*   Lipid Profile: No results for input(s): CHOL, HDL, LDLCALC, TRIG, CHOLHDL, LDLDIRECT in the last 72 hours. Thyroid Function Tests:  Recent Labs  03/02/16 2010  TSH 1.837   Anemia Panel: No results for input(s): VITAMINB12, FOLATE, FERRITIN, TIBC, IRON, RETICCTPCT in the last 72 hours. Urine analysis: No results found for: COLORURINE, APPEARANCEUR, LABSPEC, PHURINE, GLUCOSEU, HGBUR, BILIRUBINUR, KETONESUR, PROTEINUR, UROBILINOGEN, NITRITE, LEUKOCYTESUR Sepsis Labs: @LABRCNTIP (procalcitonin:4,lacticidven:4)  )No results found for this or any previous visit (from the past 240 hour(s)).    Radiology Studies: Dg Chest 2 View  03/02/2016  CLINICAL DATA:  Syncopal.  Headache.  Vomiting. EXAM: CHEST  2 VIEW COMPARISON:  None. FINDINGS: Pacer/AICD device which terminates at the right ventricle. Bilateral shoulder arthroplasties. Midline trachea. Mild cardiomegaly. Prior median sternotomy. No pleural effusion or pneumothorax.  No congestive failure. Patchy left lower lobe airspace disease IMPRESSION: Patchy left lower lobe airspace disease, suspicious for infection. Followup PA and lateral chest X-ray is recommended in 3-4 weeks following trial of antibiotic therapy to ensure resolution and exclude underlying malignancy. Electronically Signed   By: Abigail Miyamoto M.D.   On: 03/02/2016 16:59   Ct Head Wo Contrast  03/02/2016  CLINICAL DATA:  S/p fall today fell in shower did not hit head per pt. No loss consciousness EXAM: CT HEAD WITHOUT CONTRAST TECHNIQUE: Contiguous axial images were obtained from the base of the skull through the vertex without intravenous contrast. COMPARISON:  None. FINDINGS: Moderate diffuse age-related atrophy. Moderate low  attenuation throughout the periventricular white matter. No focal attenuation change to suggest mass or vascular territory infarct. No hemorrhage or extra-axial fluid. No hydrocephalus. No skull fracture. IMPRESSION: Age-related involutional change with no acute findings Electronically Signed   By: Skipper Cliche M.D.   On: 03/02/2016 16:26   Ct Angio Chest Pe W/cm &/or Wo Cm  03/02/2016  CLINICAL DATA:  Acute onset of syncope. Nausea, vomiting and dizziness. Initial encounter. EXAM: CT ANGIOGRAPHY CHEST WITH CONTRAST TECHNIQUE: Multidetector CT imaging of the chest was performed using the standard protocol during bolus administration of intravenous contrast. Multiplanar CT image reconstructions and MIPs were obtained to evaluate the vascular anatomy. CONTRAST:  90 mL of Isovue 370 IV contrast COMPARISON:  Chest radiograph performed earlier today at 4:34 p.m. FINDINGS: There is no evidence of pulmonary embolus. Patchy airspace opacification is noted at the left lower lobe, compatible with pneumonia. Minimal right basilar and right midlung opacity is also seen. There is no evidence of pleural effusion or pneumothorax. No masses are identified; no abnormal focal contrast enhancement is seen. Diffuse coronary artery calcifications are seen. The heart is mildly enlarged. An AICD is noted at the left chest wall, with associated lead ending at the right ventricle. The patient is status post median sternotomy. No pericardial effusion is identified. The great vessels are grossly unremarkable in appearance, aside from scattered calcification. No axillary lymphadenopathy is seen. The visualized portions of the thyroid gland are unremarkable in appearance. The visualized portions of the liver and spleen are unremarkable. The visualized portions of the pancreas, gallbladder, stomach and adrenal glands are within normal limits. No acute osseous abnormalities are seen. The patient's left shoulder arthroplasty is incompletely  imaged on this study. Review of the MIP images confirms the above findings. IMPRESSION: 1. No evidence of pulmonary embolus. 2. Patchy left lower lobe airspace opacification, compatible with pneumonia. Minimal right basilar and right mid lung opacity also seen. 3. Diffuse coronary artery calcifications seen. 4. Mild cardiomegaly. Electronically Signed   By: Garald Balding M.D.   On: 03/02/2016 19:32     Scheduled Meds: . ceFEPime (MAXIPIME) IV  1 g Intravenous Q8H  . enoxaparin (LOVENOX) injection  40 mg Subcutaneous Q24H  . pantoprazole  40 mg Oral Daily  . pravastatin  20 mg Oral q1800  . sodium chloride flush  3 mL Intravenous Q12H  . vancomycin  1,250 mg Intravenous Q12H   Continuous Infusions: . sodium chloride 75 mL/hr at 03/02/16 2128     Time Spent in minutes 30 minutes  Harvin Konicek D.O. on 03/03/2016 at 12:26 PM  Between 7am to 7pm - Pager - [phone]  After 7pm go to [email or web address] - password [email or web address]  And look for the night coverage person covering for me after hours  [facility] Office  [phone]

## 2016-03-03 NOTE — Progress Notes (Signed)
Orthopedic Tech Progress Note Patient Details:  Zylar Enea 1946/02/15 [id number]  Casting Type of Cast: Short leg cast Cast Location: lle Cast Intervention: Removal As ordered by Dr. Ysidro Evert, Tanise Russman 03/03/2016, 10:02 AM

## 2016-03-03 NOTE — Progress Notes (Signed)
VASCULAR LAB PRELIMINARY  PRELIMINARY  PRELIMINARY  PRELIMINARY  Carotid duplex completed.    Preliminary report:  Right:  40-59% internal carotid artery stenosis.  Left - 1% to 39% internal carotid artery stenosis Bilateral - Vertebral artery flow is antegrade.  Clementina Mareno, [place], RVS 03/03/2016, 1:23 PM

## 2016-03-03 NOTE — Progress Notes (Signed)
Pt. Has home cpap and places on himself.

## 2016-03-03 NOTE — Consult Note (Signed)
ORTHOPAEDIC CONSULTATION  REQUESTING PHYSICIAN: Cristal Ford, DO  Chief Complaint: left foot redness  HPI: Stephen Adams is a 70 y.o. male who presents with left foot redness and 4 weeks s/p triple arthrodesis by Dr. Donnie Mesa at [facility].  Comes in with pneumonia.  Denies any symptoms related to the left foot except swelling and mild pain.  Ortho consulted.  Past Medical History  Diagnosis Date  . Hypertensive heart disease   . Hyperlipidemia   . CAD (coronary artery disease)     a. 1989 PTCA LAD; b. 1999 BMS to LAD & RCA; c. 2000 PTCA RCA 2/2 ISR; d. 10/1999 Inf MI/VF Arrest: 4 stents to RCA 2/2 ISR; e. 05/2001 Cath: ISR RCA, sev LAD dzs, EF 35-40%-->CABG (details unknown); f. 09/2013 MV: Inferior infarct, no ischemia.  . Ischemic cardiomyopathy     a. 02/2015 Echo: EF 25-30%, inf, apical AK, mid-apical-anteroseptal and ant HK, Gr1 DD, mild AI, mod dil LA.  . 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 10/2008 s/p MDT [place] DR single lead AICD. Ser # [id number] H.  . Chronic systolic CHF (congestive heart failure) ([place])     a. 02/2015 Echo: EF 25-30%.  . DJD (degenerative joint disease)    Past Surgical History  Procedure Laterality Date  . Total shoulder replacement Bilateral   . Total knee arthroplasty Left   . Coronary artery bypass graft      4 vessel [facility].  Dr. Burns Spain 06/2000   Social History   Social History  . Marital Status: Married    Spouse Name: N/A  . Number of Children: 4  . Years of Education: N/A   Occupational History  . Beef Cattle Farmer    Social History Main Topics  . Smoking status: Never Smoker   . Smokeless tobacco: None  . Alcohol Use: None  . Drug Use: None  . Sexual Activity: Not Asked   Other Topics Concern  . None   Social History Narrative   Lives with wife and pony and draft horses.     Family History  Problem Relation Age of Onset  . Hypertension Father   . Heart Problems Father     bypass 1992    . CAD Mother   . Pancreatic cancer Mother   . Heart Problems Maternal Grandfather    - negative except otherwise stated in the family history section No Known Allergies Prior to Admission medications   Medication Sig Start Date End Date Taking? Authorizing Provider  aspirin 81 MG tablet Take 81 mg by mouth daily.   Yes Historical Provider, MD  carvedilol (COREG) 6.25 MG tablet TAKE 1 TABLET BY MOUTH TWICE A DAY 11/21/15  Yes Minus Breeding, MD  irbesartan-hydrochlorothiazide (AVALIDE) 150-12.5 MG tablet TAKE 1 TABLET BY MOUTH EVERY DAY 11/21/15  Yes Minus Breeding, MD  lovastatin (MEVACOR) 20 MG tablet Take 1 tablet (20 mg total) by mouth at bedtime. 01/24/16  Yes Minus Breeding, MD  pantoprazole (PROTONIX) 40 MG tablet TAKE 1 TABLET BY MOUTH EVERY DAY 11/29/15  Yes Minus Breeding, MD  traMADol (ULTRAM) 50 MG tablet Take 50 mg by mouth every 6 (six) hours as needed for moderate pain.   Yes Historical Provider, MD  furosemide (LASIX) 20 MG tablet Take 1 tablet (20 mg total) by mouth as needed (sob). 01/31/16   Evans Lance, MD  spironolactone (ALDACTONE) 25 MG tablet Take 12.5 mg by mouth daily.    Historical Provider, MD  sulfamethoxazole-trimethoprim (BACTRIM DS,SEPTRA DS)  800-160 MG tablet Take 1 tablet by mouth 2 (two) times daily.    Historical Provider, MD   Dg Chest 2 View  03/02/2016  CLINICAL DATA:  Syncopal.  Headache.  Vomiting. EXAM: CHEST  2 VIEW COMPARISON:  None. FINDINGS: Pacer/AICD device which terminates at the right ventricle. Bilateral shoulder arthroplasties. Midline trachea. Mild cardiomegaly. Prior median sternotomy. No pleural effusion or pneumothorax. No congestive failure. Patchy left lower lobe airspace disease IMPRESSION: Patchy left lower lobe airspace disease, suspicious for infection. Followup PA and lateral chest X-ray is recommended in 3-4 weeks following trial of antibiotic therapy to ensure resolution and exclude underlying malignancy. Electronically Signed   By:  Abigail Miyamoto M.D.   On: 03/02/2016 16:59   Ct Head Wo Contrast  03/02/2016  CLINICAL DATA:  S/p fall today fell in shower did not hit head per pt. No loss consciousness EXAM: CT HEAD WITHOUT CONTRAST TECHNIQUE: Contiguous axial images were obtained from the base of the skull through the vertex without intravenous contrast. COMPARISON:  None. FINDINGS: Moderate diffuse age-related atrophy. Moderate low attenuation throughout the periventricular white matter. No focal attenuation change to suggest mass or vascular territory infarct. No hemorrhage or extra-axial fluid. No hydrocephalus. No skull fracture. IMPRESSION: Age-related involutional change with no acute findings Electronically Signed   By: Skipper Cliche M.D.   On: 03/02/2016 16:26   Ct Angio Chest Pe W/cm &/or Wo Cm  03/02/2016  CLINICAL DATA:  Acute onset of syncope. Nausea, vomiting and dizziness. Initial encounter. EXAM: CT ANGIOGRAPHY CHEST WITH CONTRAST TECHNIQUE: Multidetector CT imaging of the chest was performed using the standard protocol during bolus administration of intravenous contrast. Multiplanar CT image reconstructions and MIPs were obtained to evaluate the vascular anatomy. CONTRAST:  90 mL of Isovue 370 IV contrast COMPARISON:  Chest radiograph performed earlier today at 4:34 p.m. FINDINGS: There is no evidence of pulmonary embolus. Patchy airspace opacification is noted at the left lower lobe, compatible with pneumonia. Minimal right basilar and right midlung opacity is also seen. There is no evidence of pleural effusion or pneumothorax. No masses are identified; no abnormal focal contrast enhancement is seen. Diffuse coronary artery calcifications are seen. The heart is mildly enlarged. An AICD is noted at the left chest wall, with associated lead ending at the right ventricle. The patient is status post median sternotomy. No pericardial effusion is identified. The great vessels are grossly unremarkable in appearance, aside from  scattered calcification. No axillary lymphadenopathy is seen. The visualized portions of the thyroid gland are unremarkable in appearance. The visualized portions of the liver and spleen are unremarkable. The visualized portions of the pancreas, gallbladder, stomach and adrenal glands are within normal limits. No acute osseous abnormalities are seen. The patient's left shoulder arthroplasty is incompletely imaged on this study. Review of the MIP images confirms the above findings. IMPRESSION: 1. No evidence of pulmonary embolus. 2. Patchy left lower lobe airspace opacification, compatible with pneumonia. Minimal right basilar and right mid lung opacity also seen. 3. Diffuse coronary artery calcifications seen. 4. Mild cardiomegaly. Electronically Signed   By: Garald Balding M.D.   On: 03/02/2016 19:32   - pertinent xrays, CT, MRI studies were reviewed and independently interpreted  Positive ROS: All other systems have been reviewed and were otherwise negative with the exception of those mentioned in the HPI and as above.  Physical Exam: General: Alert, no acute distress Cardiovascular: No pedal edema Respiratory: No cyanosis, no use of accessory musculature GI: No organomegaly, abdomen is  soft and non-tender Skin: No lesions in the area of chief complaint Neurologic: Sensation intact distally Psychiatric: Patient is competent for consent with normal mood and affect Lymphatic: No axillary or cervical lymphadenopathy  MUSCULOSKELETAL:  - slight erythema around lateral and medial incision, no drainage - foot is wwp  Assessment: S/p Left foot triple arthrodesis  Plan: - patient on doxy and augmentin which will be adequate coverage for cellulitis - remove staples and sutures - NWB LLE - replace cast - should f/u with Dr. Donnie Mesa after discharge  Thank you for the consult and the opportunity to see Stephen Adams. Eduard Roux, MD [place] 12:43 PM

## 2016-03-03 NOTE — Progress Notes (Signed)
PT Cancellation Note  Patient Details Name: Stephen Adams MRN: [id number] DOB: 10/11/1946   Cancelled Treatment:    Reason Eval/Treat Not Completed: Medical issues which prohibited therapy   Patient has not yet staples removed and cast replaced. Will attempt to see 5/24 a.m. anticipating patient will want to d/c home 5/24.   Jodelle Fausto 03/03/2016, 3:58 PM  Pager [phone]

## 2016-03-03 NOTE — Progress Notes (Addendum)
PT Cancellation Note  Patient Details Name: Stephen Adams MRN: [id number] DOB: 03/25/1946   Cancelled Treatment:    Reason Eval/Treat Not Completed: Medical issues which prohibited therapy  Noted orthostasis this morning and RN reports she has not heard from physician re: ?additional fluids. Also, ortho tech in during pt interview and removed cast from LLE (per Dr. Erlinda Hong, he reported).   Spoke with RN and will defer mobility until determined if cast will be replaced and/or if fluids to be given. Will reattempt later today if these issues have been addressed by MD.   Barry Brunner 03/03/2016, 9:55 AM  Pager [phone]

## 2016-03-03 NOTE — Progress Notes (Signed)
  Echocardiogram 2D Echocardiogram has been performed.  Stephen Adams 03/03/2016, 1:05 PM

## 2016-03-03 NOTE — Progress Notes (Addendum)
       Patient Name: Stephen Adams Date of Encounter: 03/03/2016    SUBJECTIVE:No recurrence of syncope. Feels better.  TELEMETRY:  Bradycardia, underlying AV block, intermittent pacing when intrisic rate < 50 bpm. Filed Vitals:   03/03/16 0037 03/03/16 [id number] 03/03/16 0641 03/03/16 0953  BP: 93/61 110/78  90/69  Pulse: 73 55  58  Temp: 98.7 F (37.1 C) 98.4 F (36.9 C)  97.7 F (36.5 C)  TempSrc: Oral Oral  Oral  Resp: 19 20  18   Height:      Weight:   212 lb 11.2 oz (96.48 kg)   SpO2: 97%   99%    Intake/Output Summary (Last 24 hours) at 03/03/16 1101 Last data filed at 03/03/16 0951  Gross per 24 hour  Intake   2150 ml  Output    750 ml  Net   1400 ml   LABS: Basic Metabolic Panel:  Recent Labs  03/02/16 1545 03/02/16 2010 03/03/16 0148  NA 124*  --  123*  K 4.7  --  4.9  CL 88*  --  88*  CO2 26  --  28  GLUCOSE 117*  --  122*  BUN 11  --  10  CREATININE 0.96  --  1.10  CALCIUM 9.1  --  8.7*  MG  --  1.4*  --    CBC:  Recent Labs  03/02/16 1545 03/03/16 0148  WBC 9.9 16.5*  NEUTROABS 7.9*  --   HGB 13.5 13.0  HCT 37.9* 36.5*  MCV 86.5 85.5  PLT 339 331   Cardiac Enzymes:  Recent Labs  03/02/16 2010 03/03/16 0148 03/03/16 0736  TROPONINI <0.03 <0.03 0.03   BNP: Invalid input(s): POCBNP Hemoglobin A1C: No results for input(s): HGBA1C in the last 72 hours. Fasting Lipid Panel: No results for input(s): CHOL, HDL, LDLCALC, TRIG, CHOLHDL, LDLDIRECT in the last 72 hours.  Radiology/Studies:  CT angio chest c/w PNA  Physical Exam: Blood pressure 90/69, pulse 58, temperature 97.7 F (36.5 C), temperature source Oral, resp. rate 18, height 5\' 10"  (1.778 m), weight 212 lb 11.2 oz (96.48 kg), SpO2 99 %. Weight change:   Wt Readings from Last 3 Encounters:  03/03/16 212 lb 11.2 oz (96.48 kg)  01/31/16 220 lb 3.2 oz (99.882 kg)  01/30/16 220 lb (99.791 kg)    Cardiac exam with no murmur or gallop.  ASSESSMENT:  1. Syncope likely  vasovagal. 2. CAD stable and MI r/o. 3. PNA 4. 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 Continue current therapy. 2. We will sign off. 3. Stephen Adams and Stephen Adams serve as his CV specialists  Signed, [name] 03/03/2016, 11:01 AM

## 2016-03-04 DIAGNOSIS — E785 Hyperlipidemia, unspecified: Secondary | ICD-10-CM

## 2016-03-04 LAB — CBC
HEMATOCRIT: 34 % — AB (ref 39.0–52.0)
Hemoglobin: 11.4 g/dL — ABNORMAL LOW (ref 13.0–17.0)
MCH: 29.2 pg (ref 26.0–34.0)
MCHC: 33.5 g/dL (ref 30.0–36.0)
MCV: 87.2 fL (ref 78.0–100.0)
PLATELETS: 319 10*3/uL (ref 150–400)
RBC: 3.9 MIL/uL — ABNORMAL LOW (ref 4.22–5.81)
RDW: 12.3 % (ref 11.5–15.5)
WBC: 7.1 10*3/uL (ref 4.0–10.5)

## 2016-03-04 LAB — MAGNESIUM: Magnesium: 1.7 mg/dL (ref 1.7–2.4)

## 2016-03-04 LAB — BASIC METABOLIC PANEL
Anion gap: 8 (ref 5–15)
BUN: 8 mg/dL (ref 6–20)
CALCIUM: 8.6 mg/dL — AB (ref 8.9–10.3)
CO2: 24 mmol/L (ref 22–32)
CREATININE: 0.77 mg/dL (ref 0.61–1.24)
Chloride: 97 mmol/L — ABNORMAL LOW (ref 101–111)
GFR calc Af Amer: >60 mL/min (ref >60)
GLUCOSE: 98 mg/dL (ref 65–99)
Potassium: 4.1 mmol/L (ref 3.5–5.1)
Sodium: 129 mmol/L — ABNORMAL LOW (ref 135–145)

## 2016-03-04 LAB — GLUCOSE, CAPILLARY: Glucose-Capillary: 104 mg/dL — ABNORMAL HIGH (ref 65–99)

## 2016-03-04 MED ORDER — MAGNESIUM SULFATE 2 GM/50ML IV SOLN
2.0000 g | Freq: Once | INTRAVENOUS | Status: AC
  Administered 2016-03-04: 2 g via INTRAVENOUS
  Filled 2016-03-04: qty 50

## 2016-03-04 NOTE — Progress Notes (Signed)
Orthopedic Tech Progress Note Patient Details:  Stephen Adams 20-Mar-1946 [id number]  Casting Type of Cast: Short leg cast Cast Location: lle Cast Intervention: Application     [name] 03/04/2016, 12:33 PM

## 2016-03-04 NOTE — Progress Notes (Signed)
PROGRESS NOTE    Stephen Adams  [id number] DOB: 1945/12/04 DOA: 03/02/2016 PCP: Henrine Screws, MD   Brief Narrative:  Stephen Adams is a 70 y.o. male with a medical history of ischemic cardiomyopathy, systolic heart failure, coronary artery disease, hypertension, presented to the emergency department with complaints of passing out. Admitted for syncope. Cardiology consulted. 2-D echo shows EF of 25-30%,. Found to have pneumonia, currently on vanc/cefepime.    Assessment & Plan   Syncope  -Possibly vasovagal versus arrhythmia vs dehydration -CT head: Age-related involutional change with no acute findings. -ICD interrogated in the emergency department, no events noted , currently telemetry shows atrial fibrillation  -CTA chest negative PE -Cardiology consulted and appreciated, no further cardiac workup needed at this time  Carotid Doppler shows Right: 40-59% internal carotid artery stenosis, which needs to be followed closely in the outpatient setting -Follow up with Dr. Laury Deep  Pneumonia -Community acquired vs aspiration  -Unsure if pneumonia led to patient's syncope vs vomiting leading to apsiration -Started on vancomycin and cefepime on 5/22 -Strep pneumonia urine antigen negative -patient afebrile, however developed leukocytosis overnight -Would likely transition to doxycycline/augmentin in the next 24 hours -Blood cultures pending  Left leg cellulitis -Orthopedic surgery, Dr. Erlinda Hong consulted and appreciated- recommended replacing cast with follow up with [facility] -Ortho tech removed LLE cast -cellulitis noted, patient completed course of bactrim -Currently vanc/cefepime should be adequate coverage Hopefully switch to doxycycline and augmentin at the time of discharge   Chronic Systolic CHF/ischemic heart disease  -S/p ICD interrogation  -Patient no longer taking spironolactone ? -Lasix held, patient appears to be dehydrated -Monitor intake/output,  daily weights -Echocardiogram 03/05/2015 shows EF [id number], grade 1 diastolic dysfunction, mild aortic valve regurgitation directed eccentrically in the LVOT -Repeat Echocardiogram similar Follows with Dr. Lovena Le and [facility] outpatient  Dehydration, hyponatremia and hypochloremia -Likely secondary to nausea and vomiting -Continue to monitor BMP, placed on gentle IV fluids  Hypertension -Blood pressure currently normotensive, will hold Coreg, Avalide, Lasix  History of coronary artery disease -Hold aspirin for surgery, hold Coreg, Avalide, Lasix -Continue statin -No complaints of chest pain, troponin currently 0  Recent history of left ankle surgery,S/p Left foot triple arthrodesis - Was scheduled to have bunion surgery on 03/03/2016 at [facility] -He was recently evaluated by cardiology in April 2017 and found to be low risk -Patient has been holding his aspirin for surgery -See discussion above.   Hyponatremia-improving with fluids , holding avalide, Lasix, Aldactone  Hypomagnesemia-replete and recheck  DVT Prophylaxis  Lovenox  Code Status: Full  Family Communication: Wife is by the bedside  Disposition Plan: Anticipate discharge tomorrow  Consultants Cardiology Orthopedic surgery, Dr. Erlinda Hong  Procedures  None  Antibiotics   Vancomycin 5/22 Cefepime 5/22    Subjective:   Stephen Adams seen and examined today.    Denies chest pain, shortness of breath, abdominal pain, dizziness, headache.    Objective:   Filed Vitals:   03/03/16 1703 03/03/16 2128 03/03/16 2359 03/04/16 0500  BP: 129/58 115/69 135/84 129/84  Pulse: 63 62 60 67  Temp: 98.5 F (36.9 C) 98.4 F (36.9 C) 98.6 F (37 C) 98.2 F (36.8 C)  TempSrc: Oral Oral Oral Oral  Resp: 18 16 16 18   Height:      Weight:    96.5 kg (212 lb 11.9 oz)  SpO2: 97% 98% 97% 97%    Intake/Output Summary (Last 24 hours) at 03/04/16 0839 Last data filed at 03/04/16 0736  Gross per 24 hour  Intake   1190 ml    Output   2360 ml  Net  -1170 ml   Filed Weights   03/02/16 2002 03/03/16 0641 03/04/16 0500  Weight: 95.573 kg (210 lb 11.2 oz) 96.48 kg (212 lb 11.2 oz) 96.5 kg (212 lb 11.9 oz)    Exam  General: Well developed, well nourished, NAD, appears stated age  58: NCAT, mucous membranes moist.   Cardiovascular: S1 S2 auscultated, no murmurs, RRR  Respiratory: Clear to auscultation bilaterally  Abdomen: Soft, nontender, nondistended, + bowel sounds  Extremities: warm dry without cyanosis clubbing or edema- LLE erythema  Neuro: AAOx3, nonfocal  Psych: Appropriate mood and affect  Data Reviewed: I have personally reviewed following labs and imaging studies  CBC:  Recent Labs Lab 03/02/16 1545 03/03/16 0148 03/04/16 0440  WBC 9.9 16.5* 7.1  NEUTROABS 7.9*  --   --   HGB 13.5 13.0 11.4*  HCT 37.9* 36.5* 34.0*  MCV 86.5 85.5 87.2  PLT 339 331 [id number]   Basic Metabolic Panel:  Recent Labs Lab 03/02/16 1545 03/02/16 2010 03/03/16 0148 03/04/16 0440  NA 124*  --  123* 129*  K 4.7  --  4.9 4.1  CL 88*  --  88* 97*  CO2 26  --  28 24  GLUCOSE 117*  --  122* 98  BUN 11  --  10 8  CREATININE 0.96  --  1.10 0.77  CALCIUM 9.1  --  8.7* 8.6*  MG  --  1.4*  --   --    GFR: Estimated Creatinine Clearance: 101.6 mL/min (by C-G formula based on Cr of 0.77). Liver Function Tests:  Recent Labs Lab 03/02/16 1545  AST 26  ALT 24  ALKPHOS 67  BILITOT 1.3*  PROT 6.7  ALBUMIN 3.1*   No results for input(s): LIPASE, AMYLASE in the last 168 hours. No results for input(s): AMMONIA in the last 168 hours. Coagulation Profile: No results for input(s): INR, PROTIME in the last 168 hours. Cardiac Enzymes:  Recent Labs Lab 03/02/16 2010 03/03/16 0148 03/03/16 0736  TROPONINI <0.03 <0.03 0.03   BNP (last 3 results) No results for input(s): PROBNP in the last 8760 hours. HbA1C: No results for input(s): HGBA1C in the last 72 hours. CBG:  Recent Labs Lab 03/03/16 0609  03/04/16 0617  GLUCAP 100* 104*   Lipid Profile: No results for input(s): CHOL, HDL, LDLCALC, TRIG, CHOLHDL, LDLDIRECT in the last 72 hours. Thyroid Function Tests:  Recent Labs  03/02/16 2010  TSH 1.837   Anemia Panel: No results for input(s): VITAMINB12, FOLATE, FERRITIN, TIBC, IRON, RETICCTPCT in the last 72 hours. Urine analysis: No results found for: COLORURINE, APPEARANCEUR, LABSPEC, [place], GLUCOSEU, [place], [place], [place], PROTEINUR, UROBILINOGEN, NITRITE, LEUKOCYTESUR Sepsis Labs: @LABRCNTIP (procalcitonin:4,lacticidven:4)  ) Recent Results (from the past 240 hour(s))  Blood culture (routine x 2)     Status: None (Preliminary result)   Collection Time: 03/02/16  5:10 PM  Result Value Ref Range Status   Specimen Description BLOOD RIGHT ANTECUBITAL  Final   Special Requests BOTTLES DRAWN AEROBIC ONLY 5CC  Final   Culture NO GROWTH < 24 HOURS  Final   Report Status PENDING  Incomplete  Blood culture (routine x 2)     Status: None (Preliminary result)   Collection Time: 03/02/16  5:19 PM  Result Value Ref Range Status   Specimen Description BLOOD RIGHT ANTECUBITAL  Final   Special Requests IN PEDIATRIC BOTTLE 3CC  Final   Culture NO GROWTH <  24 HOURS  Final   Report Status PENDING  Incomplete      Radiology Studies: Dg Chest 2 View  03/02/2016  CLINICAL DATA:  Syncopal.  Headache.  Vomiting. EXAM: CHEST  2 VIEW COMPARISON:  None. FINDINGS: Pacer/AICD device which terminates at the right ventricle. Bilateral shoulder arthroplasties. Midline trachea. Mild cardiomegaly. Prior median sternotomy. No pleural effusion or pneumothorax. No congestive failure. Patchy left lower lobe airspace disease IMPRESSION: Patchy left lower lobe airspace disease, suspicious for infection. Followup PA and lateral chest X-ray is recommended in 3-4 weeks following trial of antibiotic therapy to ensure resolution and exclude underlying malignancy. Electronically Signed   By: Abigail Miyamoto  M.D.   On: 03/02/2016 16:59   Ct Head Wo Contrast  03/02/2016  CLINICAL DATA:  S/p fall today fell in shower did not hit head per pt. No loss consciousness EXAM: CT HEAD WITHOUT CONTRAST TECHNIQUE: Contiguous axial images were obtained from the base of the skull through the vertex without intravenous contrast. COMPARISON:  None. FINDINGS: Moderate diffuse age-related atrophy. Moderate low attenuation throughout the periventricular white matter. No focal attenuation change to suggest mass or vascular territory infarct. No hemorrhage or extra-axial fluid. No hydrocephalus. No skull fracture. IMPRESSION: Age-related involutional change with no acute findings Electronically Signed   By: Skipper Cliche M.D.   On: 03/02/2016 16:26   Ct Angio Chest Pe W/cm &/or Wo Cm  03/02/2016  CLINICAL DATA:  Acute onset of syncope. Nausea, vomiting and dizziness. Initial encounter. EXAM: CT ANGIOGRAPHY CHEST WITH CONTRAST TECHNIQUE: Multidetector CT imaging of the chest was performed using the standard protocol during bolus administration of intravenous contrast. Multiplanar CT image reconstructions and MIPs were obtained to evaluate the vascular anatomy. CONTRAST:  90 mL of Isovue 370 IV contrast COMPARISON:  Chest radiograph performed earlier today at 4:34 p.m. FINDINGS: There is no evidence of pulmonary embolus. Patchy airspace opacification is noted at the left lower lobe, compatible with pneumonia. Minimal right basilar and right midlung opacity is also seen. There is no evidence of pleural effusion or pneumothorax. No masses are identified; no abnormal focal contrast enhancement is seen. Diffuse coronary artery calcifications are seen. The heart is mildly enlarged. An AICD is noted at the left chest wall, with associated lead ending at the right ventricle. The patient is status post median sternotomy. No pericardial effusion is identified. The great vessels are grossly unremarkable in appearance, aside from scattered  calcification. No axillary lymphadenopathy is seen. The visualized portions of the thyroid gland are unremarkable in appearance. The visualized portions of the liver and spleen are unremarkable. The visualized portions of the pancreas, gallbladder, stomach and adrenal glands are within normal limits. No acute osseous abnormalities are seen. The patient's left shoulder arthroplasty is incompletely imaged on this study. Review of the MIP images confirms the above findings. IMPRESSION: 1. No evidence of pulmonary embolus. 2. Patchy left lower lobe airspace opacification, compatible with pneumonia. Minimal right basilar and right mid lung opacity also seen. 3. Diffuse coronary artery calcifications seen. 4. Mild cardiomegaly. Electronically Signed   By: Garald Balding M.D.   On: 03/02/2016 19:32     Scheduled Meds: . ceFEPime (MAXIPIME) IV  1 g Intravenous Q8H  . enoxaparin (LOVENOX) injection  40 mg Subcutaneous Q24H  . pantoprazole  40 mg Oral Daily  . pravastatin  20 mg Oral q1800  . sodium chloride flush  3 mL Intravenous Q12H  . vancomycin  1,250 mg Intravenous Q12H   Continuous Infusions:  Time Spent in minutes 30 minutes  Daijanae Rafalski MD . on 03/04/2016 at 8:39 AM  Between 7am to 7pm - Pager [phone]  After 7pm go to [email or web address] - password [email or web address]  And look for the night coverage person covering for me after hours  [facility] Office  [phone]

## 2016-03-04 NOTE — Evaluation (Signed)
Occupational Therapy Evaluation Patient Details Name: Stephen Adams MRN: [id number] DOB: 10-21-1945 Today's Date: 03/04/2016    History of Present Illness Pt isa 70 y/o M admitted following a syncopal episode possibly vasovagal versus arrhythmia vs dehydration. Found to have community acquired PNA.  Pt w/ recent triple arthrodesis at [facility] and has plan to return to [facility] for bunion surgery in the next few weeks.     Clinical Impression   Pt admitted with PNA. Pt currently with functional limitations due to the deficits listed below (see OT Problem List). Pt will benefit from skilled OT to increase their safety and independence with ADL and functional mobility for ADL to facilitate discharge to venue listed below.      Follow Up Recommendations  No OT follow up    Equipment Recommendations  3 in 1 bedside comode       Precautions / Restrictions Precautions Precautions: Fall Restrictions Weight Bearing Restrictions: Yes LLE Weight Bearing: Non weight bearing Other Position/Activity Restrictions: LLE      Mobility Bed Mobility               General bed mobility comments: pt in chair  Transfers Overall transfer level: Needs assistance Equipment used: Rolling walker (2 wheeled)   Sit to Stand: Min guard         General transfer comment: VC for slow and controlled movement         ADL Overall ADL's : Needs assistance/impaired                           Toilet Transfer Details (indicate cue type and reason): verbalized safety with 3 n 1 transfer Toileting- Clothing Manipulation and Hygiene: Sit to/from stand;Min guard         General ADL Comments: Wife Aing with ADL activity. Pt has not yet had a BM.  OT offered to push recliner to bathroom or bring BSC to pt. Pt opted for [facility] to come to him.  BSC in room in elongated version which should work best for pt.  Pt verbalized understanding of transfer although did not perform the transfer.                  Pertinent Vitals/Pain Pain Assessment: No/denies pain     Hand Dominance     Extremity/Trunk Assessment Upper Extremity Assessment Upper Extremity Assessment: Overall WFL for tasks assessed           Communication Communication Communication: No difficulties         Exercises   Other Exercises Other Exercises: tricep push up from chair- 10 reps.        Home Living Family/patient expects to be discharged to:: Private residence Living Arrangements: Spouse/significant other Available Help at Discharge: Family;Available 24 hours/day Type of Home: House Home Access: Stairs to enter [employer] of Steps: 2 Entrance Stairs-Rails:  (grab handle installed) Home Layout: One level     Bathroom Shower/Tub: Occupational psychologist: Handicapped height     Home Equipment: Other (comment);Shower seat - built in;Hand held Tourist information centre manager - standard;Crutches (Knee scooter)          Prior Functioning/Environment Level of Independence: Needs assistance  Gait / Transfers Assistance Needed: using knee walker; uses std walker to transfer from knee walker into shower or for commode transfer; on stairs, uses a grab handle and a box (he places Lt knee on box and steps up RLE, then wife moves box  up a step and he repeats.  ADL's / Homemaking Assistance Needed: Wife assists w/ washing back and lower body dressing.           OT Problem List: Decreased strength   OT Treatment/Interventions: Self-care/ADL training;DME and/or AE instruction;Patient/family education    OT Goals(Current goals can be found in the care plan section) Acute Rehab OT Goals Patient Stated Goal: to go home and feel better before bunion surgery OT Goal Formulation: With patient Time For Goal Achievement: 03/18/16 Potential to Achieve Goals: Good  OT Frequency: Min 2X/week              End of Session    Activity Tolerance: Patient tolerated treatment well;No increased  pain Patient left: in chair;with call bell/phone within reach;with family/visitor present   Time: [id number] OT Time Calculation (min): 19 min Charges:  OT General Charges $OT Visit: 1 Procedure OT Evaluation $OT Eval Moderate Complexity: 1 Procedure G-Codes:    Betsy Pries March 31, 2016, 1:23 PM

## 2016-03-04 NOTE — Evaluation (Signed)
Physical Therapy Evaluation Patient Details Name: Stephen Adams MRN: [id number] DOB: 05-27-1946 Today's Date: 03/04/2016   History of Present Illness  Pt isa 70 y/o M admitted following a syncopal episode possibly vasovagal versus arrhythmia vs dehydration. Found to have community acquired PNA.  Pt w/ recent triple arthrodesis at [facility] and has plan to return to [facility] for bunion surgery in the next few weeks.      Clinical Impression  Pt admitted with above diagnosis. Pt currently with functional limitations due to the deficits listed below (see PT Problem List). Stephen Adams presents at baseline level of mobility.  His wife is available to provide 24/7 assist/supervision at d/c as she has been doing over the past month following surgery.  Pt currently requires supervision>min guard assist for all mobility. Pt will benefit from skilled PT to increase their independence and safety with mobility to allow discharge to the venue listed below.      Follow Up Recommendations Other (comment);Supervision for mobility/OOB (HHPT after bunion surgery)    Equipment Recommendations  None recommended by PT    Recommendations for Other Services       Precautions / Restrictions Precautions Precautions: Fall Required Braces or Orthoses:  (anticipating cast to be placed today) Restrictions Weight Bearing Restrictions: Yes LLE Weight Bearing: Non weight bearing      Mobility  Bed Mobility Overal bed mobility: Needs Assistance Bed Mobility: Supine to Sit     Supine to sit: Supervision     General bed mobility comments: Supervision for safety  Transfers Overall transfer level: Needs assistance Equipment used: Rolling walker (2 wheeled);None (knee scooter) Transfers: Sit to/from Stand Sit to Stand: Supervision;Min guard         General transfer comment: Supervision for safety.  Pt transfer bed<>knee scooter w/ supervision for safety.  Pt later stood from bed w/ RW w/ min guard due to min  instability.  Poorly controlled descent to chair when using RW.  Education provided on importance of controlled descent for safety.  Ambulation/Gait Ambulation/Gait assistance: Supervision Ambulation Distance (Feet): 20 Feet Assistive device: Rolling walker (2 wheeled) Gait Pattern/deviations:  (hop to on Rt LE)   Gait velocity interpretation: Below normal speed for age/gender General Gait Details: Pt fatigues quickly but demonstrates safe use of RW w/ supervision for safety.  Stairs            Wheelchair Mobility    Modified Rankin (Stroke Patients Only)       Balance Overall balance assessment: Needs assistance Sitting-balance support: No upper extremity supported;Feet supported Sitting balance-Leahy Scale: Good     Standing balance support: Bilateral upper extremity supported;During functional activity Standing balance-Leahy Scale: Poor Standing balance comment: Requires Bil UE support to remain stable                             Pertinent Vitals/Pain Pain Assessment: No/denies pain    Home Living Family/patient expects to be discharged to:: Private residence Living Arrangements: Spouse/significant other Available Help at Discharge: Family;Available 24 hours/day Type of Home: House Home Access: Stairs to enter Entrance Stairs-Rails:  (grab handle installed) Technical brewer of Steps: 2 Home Layout: One level Home Equipment: Other (comment);Shower seat - built in;Hand held Tourist information centre manager - standard;Crutches (Knee scooter)      Prior Function Level of Independence: Needs assistance   Gait / Transfers Assistance Needed: using knee walker; uses std walker to transfer from knee walker into shower or for commode transfer;  on stairs, uses a grab handle and a box (he places Lt knee on box and steps up RLE, then wife moves box up a step and he repeats.   ADL's / Homemaking Assistance Needed: Wife assists w/ washing back and lower body  dressing.        Hand Dominance        Extremity/Trunk Assessment   Upper Extremity Assessment: Overall WFL for tasks assessed           Lower Extremity Assessment: LLE deficits/detail   LLE Deficits / Details: Lt foot wrapped in bandage. Pt is anticipating cast placement today.  Weakness and limited ROM as expected ~1 month s/p surgery listed above.  Cervical / Trunk Assessment: Kyphotic  Communication   Communication: No difficulties  Cognition Arousal/Alertness: Awake/alert Behavior During Therapy: WFL for tasks assessed/performed Overall Cognitive Status: Within Functional Limits for tasks assessed                      General Comments General comments (skin integrity, edema, etc.): Denies fall w/ technique utilized for ~1 month described above for stairs.    Exercises        Assessment/Plan    PT Assessment Patient needs continued PT services  PT Diagnosis Difficulty walking   PT Problem List Decreased strength;Decreased range of motion;Decreased activity tolerance;Decreased balance;Decreased knowledge of use of DME;Decreased safety awareness;Impaired sensation  PT Treatment Interventions DME instruction;Gait training;Stair training;Functional mobility training;Therapeutic activities;Therapeutic exercise;Balance training;Patient/family education   PT Goals (Current goals can be found in the Care Plan section) Acute Rehab PT Goals Patient Stated Goal: to go home and feel better before bunion surgery PT Goal Formulation: With patient/family Time For Goal Achievement: 03/18/16 Potential to Achieve Goals: Good    Frequency Min 3X/week   Barriers to discharge        Co-evaluation               End of Session Equipment Utilized During Treatment: Gait belt Activity Tolerance: Patient tolerated treatment well Patient left: in chair;with call bell/phone within reach;with chair alarm set;with family/visitor present Nurse Communication: Mobility  status;Weight bearing status         Time: 0931-1007 PT Time Calculation (min) (ACUTE ONLY): 36 min   Charges:   PT Evaluation $PT Eval Low Complexity: 1 Procedure PT Treatments $Gait Training: 8-22 mins   PT G Codes:       Collie Siad PT, DPT  Pager: [phone] Phone: [phone] 03/04/2016, 10:36 AM

## 2016-03-04 NOTE — Progress Notes (Signed)
Orthopedic Tech Progress Note Patient Details:  Nazaiah Blaschko Oct 22, 1945 [id number]  Patient ID: [name], male   DOB: 01-30-1946, 70 y.o.   MRN: [id number] As ordered by Dr. Ysidro Evert, Sahand Gosch 03/04/2016, 12:33 PM

## 2016-03-05 DIAGNOSIS — E871 Hypo-osmolality and hyponatremia: Secondary | ICD-10-CM

## 2016-03-05 LAB — COMPREHENSIVE METABOLIC PANEL
ALBUMIN: 2.7 g/dL — AB (ref 3.5–5.0)
ALK PHOS: 57 U/L (ref 38–126)
ALT: 30 U/L (ref 17–63)
ANION GAP: 9 (ref 5–15)
AST: 24 U/L (ref 15–41)
BILIRUBIN TOTAL: 0.6 mg/dL (ref 0.3–1.2)
BUN: 8 mg/dL (ref 6–20)
CALCIUM: 8.6 mg/dL — AB (ref 8.9–10.3)
CO2: 27 mmol/L (ref 22–32)
CREATININE: 0.81 mg/dL (ref 0.61–1.24)
Chloride: 95 mmol/L — ABNORMAL LOW (ref 101–111)
GFR calc Af Amer: >60 mL/min (ref >60)
GFR calc non Af Amer: >60 mL/min (ref >60)
GLUCOSE: 114 mg/dL — AB (ref 65–99)
Potassium: 4.4 mmol/L (ref 3.5–5.1)
Sodium: 131 mmol/L — ABNORMAL LOW (ref 135–145)
TOTAL PROTEIN: 5.9 g/dL — AB (ref 6.5–8.1)

## 2016-03-05 LAB — CBC
HEMATOCRIT: 35.1 % — AB (ref 39.0–52.0)
HEMOGLOBIN: 11.7 g/dL — AB (ref 13.0–17.0)
MCH: 29.2 pg (ref 26.0–34.0)
MCHC: 33.3 g/dL (ref 30.0–36.0)
MCV: 87.5 fL (ref 78.0–100.0)
Platelets: 349 10*3/uL (ref 150–400)
RBC: 4.01 MIL/uL — ABNORMAL LOW (ref 4.22–5.81)
RDW: 12.1 % (ref 11.5–15.5)
WBC: 5.8 10*3/uL (ref 4.0–10.5)

## 2016-03-05 LAB — GLUCOSE, CAPILLARY: Glucose-Capillary: 106 mg/dL — ABNORMAL HIGH (ref 65–99)

## 2016-03-05 MED ORDER — AMOXICILLIN-POT CLAVULANATE 875-125 MG PO TABS
1.0000 | ORAL_TABLET | Freq: Two times a day (BID) | ORAL | Status: DC
  Administered 2016-03-05: 1 via ORAL
  Filled 2016-03-05: qty 1

## 2016-03-05 MED ORDER — DOXYCYCLINE HYCLATE 100 MG PO TABS
100.0000 mg | ORAL_TABLET | Freq: Two times a day (BID) | ORAL | Status: DC
  Administered 2016-03-05: 100 mg via ORAL
  Filled 2016-03-05: qty 1

## 2016-03-05 MED ORDER — DOXYCYCLINE HYCLATE 100 MG PO TABS: 100.0000 mg | ORAL_TABLET | Freq: Two times a day (BID) | ORAL | Status: DC

## 2016-03-05 MED ORDER — AMOXICILLIN-POT CLAVULANATE 875-125 MG PO TABS: 1.0000 | ORAL_TABLET | Freq: Two times a day (BID) | ORAL | Status: DC

## 2016-03-05 NOTE — Progress Notes (Signed)
Pt has orders to be discharged. Discharge instructions given and pt has no additional questions at this time. Medication regimen reviewed and pt educated. Pt verbalized understanding and has no additional questions. Telemetry box removed. IV removed and site in good condition. Pt stable and waiting for transportation.   Zavier Canela RN 

## 2016-03-05 NOTE — Care Management Important Message (Signed)
Important Message  Patient Details  Name: Stephen Adams MRN: [id number] Date of Birth: 1946/01/30   Medicare Important Message Given:  Yes    Loann Quill 03/05/2016, 12:11 PM

## 2016-03-05 NOTE — Consult Note (Signed)
   [facility] CM Inpatient Consult   03/05/2016  Seraphin Childrey Mar 19, 1946 [id number] Late entry 11 am: Patient screened for potential [place] Management services. Patient is eligible for [facility] Care Management services under patient's Medicare  plan.  Patient's primary care provider is Dr. Josetta Huddle. Patient was with wife and  discharging home and   Patient was getting dressed. Came back and patient had already discharged home.   Natividad Brood, RN BSN [facility] Liaison  [phone] business mobile phone Toll free office [phone]

## 2016-03-05 NOTE — Discharge Summary (Signed)
Physician Discharge Summary  Stephen Adams MRN: [id number] DOB/AGE: 01/24/1946 70 y.o.  PCP: Henrine Screws, MD   Admit date: 03/02/2016 Discharge date: 03/05/2016  Discharge Diagnoses:     Active Problems:   Cardiomyopathy, ischemic   Chronic systolic CHF (congestive heart failure) (HCC)   Ischemic cardiomyopathy   Hyperlipidemia   Syncope   Hyponatremia secondary to thiazide    Follow-up recommendations Follow-up with PCP in 3-5 days , including all  additional recommended appointments as below Follow-up CBC, CMP, magnesium in 3-5 days Please resume Avalide  after being evaluated by PCP for normalization of blood pressure and sodium      Current Discharge Medication List    START taking these medications   Details  amoxicillin-clavulanate (AUGMENTIN) 875-125 MG tablet Take 1 tablet by mouth 2 (two) times daily with a meal. Qty: 14 tablet, Refills: 0    doxycycline (VIBRA-TABS) 100 MG tablet Take 1 tablet (100 mg total) by mouth every 12 (twelve) hours. Qty: 14 tablet, Refills: 0      CONTINUE these medications which have NOT CHANGED   Details  aspirin 81 MG tablet Take 81 mg by mouth daily.    carvedilol (COREG) 6.25 MG tablet TAKE 1 TABLET BY MOUTH TWICE A DAY Qty: 180 tablet, Refills: 1    lovastatin (MEVACOR) 20 MG tablet Take 1 tablet (20 mg total) by mouth at bedtime. Qty: 90 tablet, Refills: 3    pantoprazole (PROTONIX) 40 MG tablet TAKE 1 TABLET BY MOUTH EVERY DAY Qty: 30 tablet, Refills: 8    traMADol (ULTRAM) 50 MG tablet Take 50 mg by mouth every 6 (six) hours as needed for moderate pain.    furosemide (LASIX) 20 MG tablet Take 1 tablet (20 mg total) by mouth as needed (sob). Qty: 30 tablet, Refills: 3   Associated Diagnoses: Chronic systolic heart failure (HCC)    spironolactone (ALDACTONE) 25 MG tablet Take 12.5 mg by mouth daily.      STOP taking these medications     irbesartan-hydrochlorothiazide (AVALIDE) 150-12.5 MG tablet       sulfamethoxazole-trimethoprim (BACTRIM DS,SEPTRA DS) 800-160 MG tablet          Discharge Condition: Stable   Discharge Instructions Get Medicines reviewed and adjusted: Please take all your medications with you for your next visit with your Primary MD  Please request your Primary MD to go over all hospital tests and procedure/radiological results at the follow up, please ask your Primary MD to get all Hospital records sent to his/her office.  If you experience worsening of your admission symptoms, develop shortness of breath, life threatening emergency, suicidal or homicidal thoughts you must seek medical attention immediately by calling 911 or calling your MD immediately if symptoms less severe.  You must read complete instructions/literature along with all the possible adverse reactions/side effects for all the Medicines you take and that have been prescribed to you. Take any new Medicines after you have completely understood and accpet all the possible adverse reactions/side effects.   Do not drive when taking Pain medications.   Do not take more than prescribed Pain, Sleep and Anxiety Medications  Special Instructions: If you have smoked or chewed Tobacco in the last 2 yrs please stop smoking, stop any regular Alcohol and or any Recreational drug use.  Wear Seat belts while driving.  Please note  You were cared for by a hospitalist during your hospital stay. Once you are discharged, your primary care physician will handle any further medical issues. Please  note that NO REFILLS for any discharge medications will be authorized once you are discharged, as it is imperative that you return to your primary care physician (or establish a relationship with a primary care physician if you do not have one) for your aftercare needs so that they can reassess your need for medications and monitor your lab values.  Discharge Instructions    Diet - low sodium heart healthy    Complete by:   As directed      Increase activity slowly    Complete by:  As directed             No Known Allergies    Disposition: Home with wife   Consults:  Cardiology    Significant Diagnostic Studies:  Dg Chest 2 View  03/02/2016  CLINICAL DATA:  Syncopal.  Headache.  Vomiting. EXAM: CHEST  2 VIEW COMPARISON:  None. FINDINGS: Pacer/AICD device which terminates at the right ventricle. Bilateral shoulder arthroplasties. Midline trachea. Mild cardiomegaly. Prior median sternotomy. No pleural effusion or pneumothorax. No congestive failure. Patchy left lower lobe airspace disease IMPRESSION: Patchy left lower lobe airspace disease, suspicious for infection. Followup PA and lateral chest X-ray is recommended in 3-4 weeks following trial of antibiotic therapy to ensure resolution and exclude underlying malignancy. Electronically Signed   By: Abigail Miyamoto M.D.   On: 03/02/2016 16:59   Ct Head Wo Contrast  03/02/2016  CLINICAL DATA:  S/p fall today fell in shower did not hit head per pt. No loss consciousness EXAM: CT HEAD WITHOUT CONTRAST TECHNIQUE: Contiguous axial images were obtained from the base of the skull through the vertex without intravenous contrast. COMPARISON:  None. FINDINGS: Moderate diffuse age-related atrophy. Moderate low attenuation throughout the periventricular white matter. No focal attenuation change to suggest mass or vascular territory infarct. No hemorrhage or extra-axial fluid. No hydrocephalus. No skull fracture. IMPRESSION: Age-related involutional change with no acute findings Electronically Signed   By: Skipper Cliche M.D.   On: 03/02/2016 16:26   Ct Angio Chest Pe W/cm &/or Wo Cm  03/02/2016  CLINICAL DATA:  Acute onset of syncope. Nausea, vomiting and dizziness. Initial encounter. EXAM: CT ANGIOGRAPHY CHEST WITH CONTRAST TECHNIQUE: Multidetector CT imaging of the chest was performed using the standard protocol during bolus administration of intravenous contrast.  Multiplanar CT image reconstructions and MIPs were obtained to evaluate the vascular anatomy. CONTRAST:  90 mL of Isovue 370 IV contrast COMPARISON:  Chest radiograph performed earlier today at 4:34 p.m. FINDINGS: There is no evidence of pulmonary embolus. Patchy airspace opacification is noted at the left lower lobe, compatible with pneumonia. Minimal right basilar and right midlung opacity is also seen. There is no evidence of pleural effusion or pneumothorax. No masses are identified; no abnormal focal contrast enhancement is seen. Diffuse coronary artery calcifications are seen. The heart is mildly enlarged. An AICD is noted at the left chest wall, with associated lead ending at the right ventricle. The patient is status post median sternotomy. No pericardial effusion is identified. The great vessels are grossly unremarkable in appearance, aside from scattered calcification. No axillary lymphadenopathy is seen. The visualized portions of the thyroid gland are unremarkable in appearance. The visualized portions of the liver and spleen are unremarkable. The visualized portions of the pancreas, gallbladder, stomach and adrenal glands are within normal limits. No acute osseous abnormalities are seen. The patient's left shoulder arthroplasty is incompletely imaged on this study. Review of the MIP images confirms the above findings. IMPRESSION: 1. No  evidence of pulmonary embolus. 2. Patchy left lower lobe airspace opacification, compatible with pneumonia. Minimal right basilar and right mid lung opacity also seen. 3. Diffuse coronary artery calcifications seen. 4. Mild cardiomegaly. Electronically Signed   By: Garald Balding M.D.   On: 03/02/2016 19:32    2-D echo LV EF: 25% - 30%  ------------------------------------------------------------------- Indications: Syncope 780.2.  ------------------------------------------------------------------- History: PMH: Ischemic cardiomyopathy.  Defibrillator. Degeneratice joint disease. Coronary artery disease. Congestive heart failure. Risk factors: Dyslipidemia.  ------------------------------------------------------------------- Study Conclusions  - Left ventricle: The cavity size was moderately dilated. Wall  thickness was increased in a pattern of mild LVH. Systolic  function was severely reduced. The estimated ejection fraction  was in the range of 25% to 30%. Doppler parameters are consistent  with abnormal left ventricular relaxation (grade 1 diastolic  dysfunction). - Aortic valve: Mildly calcified annulus. Moderately thickened,  moderately calcified leaflets. - Left atrium: The atrium was severely dilated. - Right ventricle: The cavity size was mildly dilated. Systolic  function was moderately reduced.   Filed Weights   03/03/16 0641 03/04/16 0500 03/05/16 0521  Weight: 96.48 kg (212 lb 11.2 oz) 96.5 kg (212 lb 11.9 oz) 98.2 kg (216 lb 7.9 oz)     Microbiology: Recent Results (from the past 240 hour(s))  Blood culture (routine x 2)     Status: None (Preliminary result)   Collection Time: 03/02/16  5:10 PM  Result Value Ref Range Status   Specimen Description BLOOD RIGHT ANTECUBITAL  Final   Special Requests BOTTLES DRAWN AEROBIC ONLY 5CC  Final   Culture NO GROWTH 2 DAYS  Final   Report Status PENDING  Incomplete  Blood culture (routine x 2)     Status: None (Preliminary result)   Collection Time: 03/02/16  5:19 PM  Result Value Ref Range Status   Specimen Description BLOOD RIGHT ANTECUBITAL  Final   Special Requests IN PEDIATRIC BOTTLE 3CC  Final   Culture NO GROWTH 2 DAYS  Final   Report Status PENDING  Incomplete       Blood Culture    Component Value Date/Time   SDES BLOOD RIGHT ANTECUBITAL 03/02/2016 1719   SPECREQUEST IN PEDIATRIC BOTTLE 3CC 03/02/2016 1719   CULT NO GROWTH 2 DAYS 03/02/2016 1719   REPTSTATUS PENDING 03/02/2016 1719      Labs: Results for orders placed  or performed during the hospital encounter of 03/02/16 (from the past 48 hour(s))  CBC     Status: Abnormal   Collection Time: 03/04/16  4:40 AM  Result Value Ref Range   WBC 7.1 4.0 - 10.5 K/uL   RBC 3.90 (L) 4.22 - 5.81 MIL/uL   Hemoglobin 11.4 (L) 13.0 - 17.0 g/dL   HCT 34.0 (L) 39.0 - 52.0 %   MCV 87.2 78.0 - 100.0 fL   MCH 29.2 26.0 - 34.0 pg   MCHC 33.5 30.0 - 36.0 g/dL   RDW 12.3 11.5 - 15.5 %   Platelets 319 150 - 400 K/uL  Basic metabolic panel     Status: Abnormal   Collection Time: 03/04/16  4:40 AM  Result Value Ref Range   Sodium 129 (L) 135 - 145 mmol/L   Potassium 4.1 3.5 - 5.1 mmol/L   Chloride 97 (L) 101 - 111 mmol/L   CO2 24 22 - 32 mmol/L   Glucose, Bld 98 65 - 99 mg/dL   BUN 8 6 - 20 mg/dL   Creatinine, Ser 0.77 0.61 - 1.24 mg/dL   Calcium 8.6 (  L) 8.9 - 10.3 mg/dL   GFR calc non Af Amer >60 >60 mL/min   GFR calc Af Amer >60 >60 mL/min    Comment: (NOTE) The eGFR has been calculated using the CKD EPI equation. This calculation has not been validated in all clinical situations. eGFR's persistently <60 mL/min signify possible Chronic Kidney Disease.    Anion gap 8 5 - 15  Magnesium     Status: None   Collection Time: 03/04/16  4:40 AM  Result Value Ref Range   Magnesium 1.7 1.7 - 2.4 mg/dL  Glucose, capillary     Status: Abnormal   Collection Time: 03/04/16  6:17 AM  Result Value Ref Range   Glucose-Capillary 104 (H) 65 - 99 mg/dL  CBC     Status: Abnormal   Collection Time: 03/05/16  5:59 AM  Result Value Ref Range   WBC 5.8 4.0 - 10.5 K/uL   RBC 4.01 (L) 4.22 - 5.81 MIL/uL   Hemoglobin 11.7 (L) 13.0 - 17.0 g/dL   HCT 35.1 (L) 39.0 - 52.0 %   MCV 87.5 78.0 - 100.0 fL   MCH 29.2 26.0 - 34.0 pg   MCHC 33.3 30.0 - 36.0 g/dL   RDW 12.1 11.5 - 15.5 %   Platelets 349 150 - 400 K/uL  Comprehensive metabolic panel     Status: Abnormal   Collection Time: 03/05/16  5:59 AM  Result Value Ref Range   Sodium 131 (L) 135 - 145 mmol/L   Potassium 4.4 3.5 -  5.1 mmol/L   Chloride 95 (L) 101 - 111 mmol/L   CO2 27 22 - 32 mmol/L   Glucose, Bld 114 (H) 65 - 99 mg/dL   BUN 8 6 - 20 mg/dL   Creatinine, Ser 0.81 0.61 - 1.24 mg/dL   Calcium 8.6 (L) 8.9 - 10.3 mg/dL   Total Protein 5.9 (L) 6.5 - 8.1 g/dL   Albumin 2.7 (L) 3.5 - 5.0 g/dL   AST 24 15 - 41 U/L   ALT 30 17 - 63 U/L   Alkaline Phosphatase 57 38 - 126 U/L   Total Bilirubin 0.6 0.3 - 1.2 mg/dL   GFR calc non Af Amer >60 >60 mL/min   GFR calc Af Amer >60 >60 mL/min    Comment: (NOTE) The eGFR has been calculated using the CKD EPI equation. This calculation has not been validated in all clinical situations. eGFR's persistently <60 mL/min signify possible Chronic Kidney Disease.    Anion gap 9 5 - 15  Glucose, capillary     Status: Abnormal   Collection Time: 03/05/16  6:07 AM  Result Value Ref Range   Glucose-Capillary 106 (H) 65 - 99 mg/dL     Lipid Panel     Component Value Date/Time   CHOL 141 08/19/2015 0821   TRIG 64 08/19/2015 0821   HDL 24* 08/19/2015 0821   CHOLHDL 5.9* 08/19/2015 0821   VLDL 13 08/19/2015 0821   LDLCALC 104 08/19/2015 0821     No results found for: HGBA1C   Lab Results  Component Value Date   LDLCALC 104 08/19/2015   CREATININE 0.81 03/05/2016     HPI : 70 year old Caucasian male with past medical history of CAD s/p CABG 2002, hypertension, hyperlipidemia, and ICM with baseline EF 25-30% s/p [employer] ICD. He has been followed by both Dr. Jodi Mourning and Dr. Lovena Le.presented to the emergency department with complaints of passing out. Admitted for syncope. Cardiology consulted .  Marland Kitchen He was seen in  the cardiology office in April 2017 at that time, his ICD has reached ERI on previous interrogation. After discussing with Dr. Lovena Le, the plan was to clear him for surgery, and once he recovers from his ankle surgery, we will plan to have ICD generator change.  For the past several days, patient has been having chills and malaise. He was taking shower  when his symptom continued to worsen. Per patient's wife, patient initially hunched over, but when his wife was trying to bring his head up, he had nausea and subsequent vomiting. He subsequently passed out for approximately 10 seconds.   He was originally supposed to go back to [facility] tomorrow for bunion surgery 5/23. He was admitted to hospitalist service at [facility] for syncope . Initial CT of the head was negative for acute process. CTA of the chest however shows left lower lobe pneumonia. His ICD has been interrogated, it does show a roughly 3 minutes of VT episode in January 2017, however there is no acute event this morning. He denies getting shocked by his ICD. He was admitted to the hospitalist service for treatment of pneumonia and dehydration. Cardiology has been consulted for syncope.    HOSPITAL COURSE:  Syncope  -Possibly vasovagal versus arrhythmia vs dehydration -CT head: Age-related involutional change with no acute findings. -ICD interrogated in the emergency department, no events noted , currently telemetry shows atrial fibrillation  -CTA chest negative PE -Cardiology consulted and appreciated, no further cardiac workup needed at this time Carotid Doppler shows Right: 40-59% internal carotid artery stenosis, which needs to be followed closely in the outpatient setting -Follow up with Dr. Laury Deep Diuretics held due to dehydration and hyponatremia, Lasix resumed prior to discharge  Pneumonia -Community acquired vs aspiration  -Unsure if pneumonia led to patient's syncope vs vomiting leading to apsiration -Started on vancomycin and cefepime on 5/22 3 days -Strep pneumonia urine antigen negative -patient afebrile, however developed leukocytosis overnight -Now switched to doxycycline/augmentin 7 more days -Blood cultures blood culture from 5/22 growth so far   Left leg cellulitis -Orthopedic surgery, Dr. Erlinda Hong consulted and appreciated- recommended  replacing cast with follow up with [facility] -Ortho tech removed LLE cast -cellulitis noted, patient completed course of bactrim Subsequently given vanc/cefepime should be adequate coverage 3 days Then doxycycline and augmentin at the time of discharge   Chronic Systolic CHF/ischemic heart disease  -S/p ICD interrogation  Diuretics held initially but Lasix resumed prior to discharge -Echocardiogram 03/05/2015 shows EF 85-46%, grade 1 diastolic dysfunction, mild aortic valve regurgitation directed eccentrically in the LVOT -Repeat Echocardiogram similar Follows with Dr. Lovena Le and [facility] outpatient  Dehydration, hyponatremia and hypochloremia -Likely secondary to nausea and vomiting -Continue to monitor BMP, placed on gentle IV fluids Continue to hold Avalide secondary to hyponatremia  Hypertension -Blood pressure currently normotensive, resumed Coreg and Lasix  History of coronary artery disease Resume aspirin, Coreg, statin Cardiac enzymes negative 3  Recent history of left ankle surgery,S/p Left foot triple arthrodesis - Was scheduled to have bunion surgery on 03/03/2016 at [facility] -He was recently evaluated by cardiology in April 2017 and found to be low risk Resume aspirin and reschedule outpatient surgery    Hyponatremia-improving with fluids , holding avalide, resumed Lasix  Hypomagnesemia-replete and recheck  Discharge Exam:   Blood pressure 117/73, pulse 68, temperature 98.4 F (36.9 C), temperature source Oral, resp. rate 18, height 5' 10"  (1.778 m), weight 98.2 kg (216 lb 7.9 oz), SpO2 95 %.   General: Well  developed, well nourished, NAD, appears stated age  1: NCAT, mucous membranes moist.   Cardiovascular: S1 S2 auscultated, no murmurs, RRR  Respiratory: Clear to auscultation bilaterally  Abdomen: Soft, nontender, nondistended, + bowel sounds  Extremities: warm dry without cyanosis clubbing or edema- LLE erythema  Neuro: AAOx3,  nonfocal  Psych: Appropriate mood and affect    Follow-up Information    Follow up with GATES,ROBERT NEVILL, MD. Schedule an appointment as soon as possible for a visit in 3 days.   Specialty:  Internal Medicine   Why:  Hospital follow-up   Contact information:   [address] [place] [address] [place] [address] [phone]       Follow up with Minus Breeding, MD. Schedule an appointment as soon as possible for a visit in 1 week.   Specialty:  Cardiology   Why:  Hospital follow-up   Contact information:   [address] [place] [address] [phone]       Signed: Reyne Dumas 03/05/2016, 10:41 AM        Time spent >45 mins

## 2016-03-05 NOTE — Progress Notes (Signed)
Occupational Therapy Treatment Patient Details Name: Stephen Adams MRN: [id number] DOB: Feb 11, 1946 Today's Date: 03/05/2016    History of present illness Pt isa 70 y/o M admitted following a syncopal episode possibly vasovagal versus arrhythmia vs dehydration. Found to have community acquired PNA.  Pt w/ recent triple arthrodesis at [facility] and has plan to return to [facility] for bunion surgery in the next few weeks.     OT comments  Pt needed to sit on toilet as he had not had a bowel movement.  Pt able to get to bathroom and sit on toilet with 3 n1 over toilet  Follow Up Recommendations  No OT follow up    Equipment Recommendations  3 in 1 bedside comode    Recommendations for Other Services      Precautions / Restrictions Precautions Precautions: Fall Restrictions Weight Bearing Restrictions: Yes LLE Weight Bearing: Non weight bearing Other Position/Activity Restrictions: LLE       Mobility Bed Mobility               General bed mobility comments: pt in chair  Transfers Overall transfer level: Needs assistance Equipment used: Rolling walker (2 wheeled) Transfers: Sit to/from Stand Sit to Stand: Min assist         General transfer comment: Vc for hand placement        ADL                           Toilet Transfer: RW;Minimal assistance;Cueing for safety;Cueing for sequencing;Ambulation;Comfort height toilet   Toileting- Clothing Manipulation and Hygiene: Minimal assistance;Sit to/from stand;Cueing for safety         General ADL Comments: Pt used walker to hop to bathroom.  OT parker recliner near bathroom door. Pt able to hop aapprox 5 steps to sit on toilet and maintain NWB      Vision                            Cognition   Behavior During Therapy: [facility] for tasks assessed/performed Overall Cognitive Status: Within Functional Limits for tasks assessed                                    Pertinent Vitals/ Pain        Pain Assessment: No/denies pain         Frequency       Progress Toward Goals  OT Goals(current goals can now be found in the care plan section)  Progress towards OT goals: Progressing toward goals     Plan Discharge plan remains appropriate    Co-evaluation                 End of Session Equipment Utilized During Treatment: Rolling walker   Activity Tolerance Patient tolerated treatment well   Patient Left Other (comment);with family/visitor present (on toilet with wife present)   Nurse Communication Mobility status        Time: [id number] OT Time Calculation (min): 9 min  Charges: OT General Charges $OT Visit: 1 Procedure OT Treatments $Self Care/Home Management : 8-22 mins  Brylei Pedley D 03/05/2016, 10:56 AM

## 2016-03-06 DIAGNOSIS — M19072 Primary osteoarthritis, left ankle and foot: Secondary | ICD-10-CM | POA: Diagnosis not present

## 2016-03-06 DIAGNOSIS — M2142 Flat foot [pes planus] (acquired), left foot: Secondary | ICD-10-CM | POA: Diagnosis not present

## 2016-03-06 DIAGNOSIS — S93135D Subluxation of interphalangeal joint of left lesser toe(s), subsequent encounter: Secondary | ICD-10-CM | POA: Diagnosis not present

## 2016-03-06 DIAGNOSIS — M2012 Hallux valgus (acquired), left foot: Secondary | ICD-10-CM | POA: Diagnosis not present

## 2016-03-06 DIAGNOSIS — M6702 Short Achilles tendon (acquired), left ankle: Secondary | ICD-10-CM | POA: Diagnosis not present

## 2016-03-06 DIAGNOSIS — S93145D Subluxation of metatarsophalangeal joint of left lesser toe(s), subsequent encounter: Secondary | ICD-10-CM | POA: Diagnosis not present

## 2016-03-06 DIAGNOSIS — M6240 Contracture of muscle, unspecified site: Secondary | ICD-10-CM | POA: Diagnosis not present

## 2016-03-07 LAB — CULTURE, BLOOD (ROUTINE X 2)
CULTURE: NO GROWTH
Culture: NO GROWTH

## 2016-03-08 ENCOUNTER — Other Ambulatory Visit: Payer: Self-pay | Admitting: Cardiology

## 2016-03-18 DIAGNOSIS — E871 Hypo-osmolality and hyponatremia: Secondary | ICD-10-CM | POA: Diagnosis not present

## 2016-03-18 DIAGNOSIS — J189 Pneumonia, unspecified organism: Secondary | ICD-10-CM | POA: Diagnosis not present

## 2016-03-18 DIAGNOSIS — E785 Hyperlipidemia, unspecified: Secondary | ICD-10-CM | POA: Diagnosis not present

## 2016-03-18 DIAGNOSIS — Z95810 Presence of automatic (implantable) cardiac defibrillator: Secondary | ICD-10-CM | POA: Diagnosis not present

## 2016-03-18 DIAGNOSIS — I10 Essential (primary) hypertension: Secondary | ICD-10-CM | POA: Diagnosis not present

## 2016-03-18 DIAGNOSIS — I2510 Atherosclerotic heart disease of native coronary artery without angina pectoris: Secondary | ICD-10-CM | POA: Diagnosis not present

## 2016-03-18 DIAGNOSIS — R55 Syncope and collapse: Secondary | ICD-10-CM | POA: Diagnosis not present

## 2016-03-20 DIAGNOSIS — M19072 Primary osteoarthritis, left ankle and foot: Secondary | ICD-10-CM | POA: Diagnosis not present

## 2016-03-20 DIAGNOSIS — M6702 Short Achilles tendon (acquired), left ankle: Secondary | ICD-10-CM | POA: Diagnosis not present

## 2016-03-20 DIAGNOSIS — M2142 Flat foot [pes planus] (acquired), left foot: Secondary | ICD-10-CM | POA: Diagnosis not present

## 2016-03-20 DIAGNOSIS — M2012 Hallux valgus (acquired), left foot: Secondary | ICD-10-CM | POA: Diagnosis not present

## 2016-03-20 DIAGNOSIS — S93145D Subluxation of metatarsophalangeal joint of left lesser toe(s), subsequent encounter: Secondary | ICD-10-CM | POA: Diagnosis not present

## 2016-03-20 DIAGNOSIS — S93135D Subluxation of interphalangeal joint of left lesser toe(s), subsequent encounter: Secondary | ICD-10-CM | POA: Diagnosis not present

## 2016-03-23 ENCOUNTER — Ambulatory Visit (INDEPENDENT_AMBULATORY_CARE_PROVIDER_SITE_OTHER): Payer: Medicare Other | Admitting: Cardiology

## 2016-03-23 ENCOUNTER — Encounter: Payer: Self-pay | Admitting: Cardiology

## 2016-03-23 VITALS — BP 149/94 | HR 54 | Ht 70.0 in | Wt 221.1 lb

## 2016-03-23 DIAGNOSIS — Z951 Presence of aortocoronary bypass graft: Secondary | ICD-10-CM | POA: Diagnosis not present

## 2016-03-23 DIAGNOSIS — Z4502 Encounter for adjustment and management of automatic implantable cardiac defibrillator: Secondary | ICD-10-CM

## 2016-03-23 DIAGNOSIS — S99922S Unspecified injury of left foot, sequela: Secondary | ICD-10-CM

## 2016-03-23 DIAGNOSIS — I255 Ischemic cardiomyopathy: Secondary | ICD-10-CM

## 2016-03-23 DIAGNOSIS — R55 Syncope and collapse: Secondary | ICD-10-CM | POA: Diagnosis not present

## 2016-03-23 DIAGNOSIS — E785 Hyperlipidemia, unspecified: Secondary | ICD-10-CM

## 2016-03-23 DIAGNOSIS — S99922A Unspecified injury of left foot, initial encounter: Secondary | ICD-10-CM | POA: Insufficient documentation

## 2016-03-23 NOTE — Assessment & Plan Note (Signed)
10/2008 s/p MDT [address] single lead AICD. Ser # [id number] H. Now at EOL

## 2016-03-23 NOTE — Assessment & Plan Note (Signed)
Hospitalized 5/22-5/25/17 for syncope, felt to be secondary to CAP

## 2016-03-23 NOTE — Assessment & Plan Note (Signed)
CABG x 4 2002 (several prior PCI's) in [place]. Myoview Nov 2014- scar without ischemia 

## 2016-03-23 NOTE — Progress Notes (Signed)
03/23/2016 Stephen Adams   09/04/1946  DO:[id number]  Primary Physician Henrine Screws, MD Primary Cardiologist: Dr JH/GT  HPI:  70 year old male with the above complex past medical history. He is status post multiple percutaneous interventions in the late 80s and 90s with subsequent CABG in 2002. All of his prior procedures were performed in [place]. His last stress test wasn't decerebrate 2014 revealing inferior infarct without ischemia. He also has an AICD and is followed locally in our electrophysiology clinic.   He has had chronic left foot pain that is worsened over the past 3 years. This has been present ever since he slipped on some ice and ruptured a tendon 2014. He had been conservatively managed with a brace however more recently, this brace has been creating an ulceration on a bunion. He sought surgical evaluation at [facility] (Dr [facility])  April. He had surgery then and need further bunionectomy. In the interm his ICD has been noted to be near EOL. He needs a battery change ou but Dr Lovena Le wants to wait till after his foot surgery (f/u in July). While he was recovering from his foot surgery he developed CAP and was admitted 03/02/16-03/05/16 with syncope. His device was interrogated and we saw him in consult. It was felt his syncope was secondary to CAP and not arrhythmia. He is in the office today for follow up. He has had no further syncopal spells. He only takes diuretics PRN and he and his wife confirm that he rarely has to take any.    Current Outpatient Prescriptions  Medication Sig Dispense Refill  . aspirin 81 MG tablet Take 81 mg by mouth daily.    . carvedilol (COREG) 6.25 MG tablet TAKE 1 TABLET BY MOUTH TWICE A DAY 180 tablet 1  . lovastatin (MEVACOR) 20 MG tablet Take 1 tablet (20 mg total) by mouth at bedtime. 90 tablet 3  . pantoprazole (PROTONIX) 40 MG tablet TAKE 1 TABLET BY MOUTH EVERY DAY 30 tablet 8   No current facility-administered medications for  this visit.    Allergies  Allergen Reactions  . Oxycodone-Acetaminophen Nausea Only    Social History   Social History  . Marital Status: Married    Spouse Name: N/A  . Number of Children: 4  . Years of Education: N/A   Occupational History  . Beef Cattle Farmer    Social History Main Topics  . Smoking status: Never Smoker   . Smokeless tobacco: Not on file  . Alcohol Use: Not on file  . Drug Use: Not on file  . Sexual Activity: Not on file   Other Topics Concern  . Not on file   Social History Narrative   Lives with wife and pony and draft horses.       Review of Systems: General: negative for chills, fever, night sweats or weight changes.  Cardiovascular: negative for chest pain, dyspnea on exertion, edema, orthopnea, palpitations, paroxysmal nocturnal dyspnea or shortness of breath Dermatological: negative for rash Respiratory: negative for cough or wheezing Urologic: negative for hematuria Abdominal: negative for nausea, vomiting, diarrhea, bright red blood per rectum, melena, or hematemesis Neurologic: negative for visual changes, syncope, or dizziness All other systems reviewed and are otherwise negative except as noted above.    Blood pressure 149/94, pulse 54, height 5\' 10"  (1.778 m), weight 221 lb 1 oz (100.273 kg).  General appearance: alert, cooperative and no distress Neck: no carotid bruit and no JVD Lungs: clear to auscultation bilaterally  Heart: regular rate and rhythm and soft systolic murmur AOV Extremities: Lt foot boot in place, no edema RLE Skin: warm and dry Neurologic: Grossly normal   ASSESSMENT AND PLAN:   Syncope Hospitalized 5/22-5/25/17 for syncope, felt to be secondary to CAP  Ischemic cardiomyopathy Echo 03/03/16- EF 25-30%  Hx of CABG CABG x 4 2002 (several prior PCI's) in [place]. Myoview Nov 2014- scar without ischemia  ICD (implantable cardioverter-defibrillator) battery depletion 10/2008 s/p MDT [employer] single lead  AICD. Ser # [id number] H. Now at EOL  Hyperlipidemia On statin Rx   PLAN  Same Rx. Keep f/u in July with Dr Lovena Le for ICD change out.  Kerin Ransom PA-C 03/23/2016 8:19 AM

## 2016-03-23 NOTE — Assessment & Plan Note (Signed)
Echo 03/03/16- EF 25-30%

## 2016-03-23 NOTE — Patient Instructions (Addendum)
Your physician recommends that you continue on your current medications as directed. Please refer to the Current Medication list given to you today.  Please keep your previously scheduled appointment with Dr Lovena Le in July.  If you need a refill on your cardiac medications before your next appointment, please call your pharmacy.

## 2016-03-23 NOTE — Assessment & Plan Note (Signed)
On statin Rx 

## 2016-03-31 ENCOUNTER — Telehealth: Payer: Self-pay | Admitting: *Deleted

## 2016-03-31 DIAGNOSIS — M2012 Hallux valgus (acquired), left foot: Secondary | ICD-10-CM | POA: Diagnosis not present

## 2016-03-31 DIAGNOSIS — M21612 Bunion of left foot: Secondary | ICD-10-CM | POA: Diagnosis not present

## 2016-03-31 DIAGNOSIS — M19072 Primary osteoarthritis, left ankle and foot: Secondary | ICD-10-CM | POA: Diagnosis not present

## 2016-03-31 NOTE — Telephone Encounter (Signed)
MD from [facility] called regarding patient's ICD function and pacing rate.  Advised that the patient's device is programmed to VVI 40 for back-up pacing if necessary.  Advised that the device is also at [facility], but that the patient and Dr. Lovena Le discussed this at his appointment on 01/31/16 and the patient has elected to wait until after his ankle heals from surgery before proceeding with generator change.  MD verbalizes understanding and denies additional questions or concern at this time.

## 2016-04-01 DIAGNOSIS — M2012 Hallux valgus (acquired), left foot: Secondary | ICD-10-CM | POA: Diagnosis not present

## 2016-04-15 DIAGNOSIS — M6702 Short Achilles tendon (acquired), left ankle: Secondary | ICD-10-CM | POA: Diagnosis not present

## 2016-04-15 DIAGNOSIS — M2142 Flat foot [pes planus] (acquired), left foot: Secondary | ICD-10-CM | POA: Diagnosis not present

## 2016-04-15 DIAGNOSIS — M2012 Hallux valgus (acquired), left foot: Secondary | ICD-10-CM | POA: Diagnosis not present

## 2016-04-15 DIAGNOSIS — M19072 Primary osteoarthritis, left ankle and foot: Secondary | ICD-10-CM | POA: Diagnosis not present

## 2016-04-23 ENCOUNTER — Encounter: Payer: Self-pay | Admitting: Internal Medicine

## 2016-04-23 ENCOUNTER — Ambulatory Visit (INDEPENDENT_AMBULATORY_CARE_PROVIDER_SITE_OTHER): Payer: Medicare Other | Admitting: Internal Medicine

## 2016-04-23 VITALS — BP 138/80 | HR 56 | Ht 70.0 in | Wt 222.8 lb

## 2016-04-23 DIAGNOSIS — I255 Ischemic cardiomyopathy: Secondary | ICD-10-CM

## 2016-04-23 DIAGNOSIS — Z4502 Encounter for adjustment and management of automatic implantable cardiac defibrillator: Secondary | ICD-10-CM

## 2016-04-23 DIAGNOSIS — R55 Syncope and collapse: Secondary | ICD-10-CM | POA: Diagnosis not present

## 2016-04-23 LAB — CUP PACEART INCLINIC DEVICE CHECK
Battery Voltage: 2.62 V
Brady Statistic RV Percent Paced: 2.24 %
Date Time Interrogation Session: 20170713132936
HighPow Impedance: 41 Ohm
HighPow Impedance: 50 Ohm
Implantable Lead Location: 753860
Implantable Lead Model: 6947
Lead Channel Sensing Intrinsic Amplitude: 19.89 mV
Lead Channel Setting Pacing Pulse Width: 0.4 ms
Lead Channel Setting Sensing Sensitivity: 0.3 mV
MDC IDC LEAD IMPLANT DT: 20100104
MDC IDC MSMT LEADCHNL RV IMPEDANCE VALUE: 376 Ohm
MDC IDC MSMT LEADCHNL RV PACING THRESHOLD AMPLITUDE: 1.5 V
MDC IDC MSMT LEADCHNL RV PACING THRESHOLD PULSEWIDTH: 0.4 ms
MDC IDC SET LEADCHNL RV PACING AMPLITUDE: 3 V

## 2016-04-23 LAB — BASIC METABOLIC PANEL
BUN: 13 mg/dL (ref 7–25)
CO2: 25 mmol/L (ref 20–31)
CREATININE: 0.88 mg/dL (ref 0.70–1.18)
Calcium: 9 mg/dL (ref 8.6–10.3)
Chloride: 99 mmol/L (ref 98–110)
GLUCOSE: 98 mg/dL (ref 65–99)
POTASSIUM: 4.4 mmol/L (ref 3.5–5.3)
Sodium: 134 mmol/L — ABNORMAL LOW (ref 135–146)

## 2016-04-23 LAB — CBC WITH DIFFERENTIAL/PLATELET
Basophils Absolute: 43 cells/uL (ref 0–200)
Basophils Relative: 1 %
Eosinophils Absolute: 172 cells/uL (ref 15–500)
Eosinophils Relative: 4 %
HEMATOCRIT: 44.6 % (ref 38.5–50.0)
Hemoglobin: 14.5 g/dL (ref 13.2–17.1)
LYMPHS PCT: 30 %
Lymphs Abs: 1290 cells/uL (ref 850–3900)
MCH: 29.8 pg (ref 27.0–33.0)
MCHC: 32.5 g/dL (ref 32.0–36.0)
MCV: 91.8 fL (ref 80.0–100.0)
MONO ABS: 645 {cells}/uL (ref 200–950)
MPV: 10.1 fL (ref 7.5–12.5)
Monocytes Relative: 15 %
NEUTROS PCT: 50 %
Neutro Abs: 2150 cells/uL (ref 1500–7800)
Platelets: 227 10*3/uL (ref 140–400)
RBC: 4.86 MIL/uL (ref 4.20–5.80)
RDW: 13.9 % (ref 11.0–15.0)
WBC: 4.3 10*3/uL (ref 3.8–10.8)

## 2016-04-23 NOTE — Patient Instructions (Signed)
Medication Instructions:  Your physician recommends that you continue on your current medications as directed. Please refer to the Current Medication list given to you today.   Labwork: Your physician recommends that you return for lab work today: BMP/CBC   Testing/Procedures: Pacemaker generator change on 05/05/16  Please check in at the [facility] at 5:30am Do not eat or drink after midnight Do not take any medications the morning of procedure Use scrub given   Follow-Up: Your physician recommends that you schedule a follow-up appointment in: 10-14 days from 05/05/16 with device clinic for wound check and 3 months from 05/05/16 with Dr Lovena Le   Any Other Special Instructions Will Be Listed Below (If Applicable).     If you need a refill on your cardiac medications before your next appointment, please call your pharmacy.

## 2016-04-26 NOTE — Progress Notes (Signed)
HPI Mr. Mccully returns today for ongoing management of his ICD. He is a very pleasant 70 year old man with a long-standing history of ischemic heart disease, status post MI initially at age 7. He had undergone bypass surgery approximately 15 years ago. Prior to that he had had multiple coronary stents placed. The patient has never had syncope and denies any prior ICD shocks. In theinterim, he has undergone foot surgery and then developed pneumonia. He has had one episode of VT which terminated spontaneously and was below the device detection rate. He has class 2 CHF symptoms. He has reached ERI. Allergies  Allergen Reactions  . Oxycodone-Acetaminophen Nausea Only     Current Outpatient Prescriptions  Medication Sig Dispense Refill  . aspirin 81 MG tablet Take 81 mg by mouth daily.    . carvedilol (COREG) 6.25 MG tablet TAKE 1 TABLET BY MOUTH TWICE A DAY 180 tablet 1  . irbesartan-hydrochlorothiazide (AVALIDE) 150-12.5 MG tablet Take 1 tablet by mouth daily.    Marland Kitchen lovastatin (MEVACOR) 20 MG tablet Take 1 tablet (20 mg total) by mouth at bedtime. 90 tablet 3  . pantoprazole (PROTONIX) 40 MG tablet TAKE 1 TABLET BY MOUTH EVERY DAY 30 tablet 8   No current facility-administered medications for this visit.     Past Medical History  Diagnosis Date  . Hypertensive heart disease   . Hyperlipidemia   . CAD (coronary artery disease)     a. 1989 PTCA LAD; b. 1999 BMS to LAD & RCA; c. 2000 PTCA RCA 2/2 ISR; d. 10/1999 Inf MI/VF Arrest: 4 stents to RCA 2/2 ISR; e. 05/2001 Cath: ISR RCA, sev LAD dzs, EF 35-40%-->CABG (details unknown); f. 09/2013 MV: Inferior infarct, no ischemia.  . Ischemic cardiomyopathy     a. 02/2015 Echo: EF 25-30%, inf, apical AK, mid-apical-anteroseptal and ant HK, Gr1 DD, mild AI, mod dil LA.  . 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 10/2008 s/p MDT [employer] single lead AICD. Ser # [id number] H.  . Chronic systolic CHF (congestive  heart failure) ([place])     a. 02/2015 Echo: EF 25-30%.  . DJD (degenerative joint disease)     ROS:   All systems reviewed and negative except as noted in the HPI.   Past Surgical History  Procedure Laterality Date  . Total shoulder replacement Bilateral   . Total knee arthroplasty Left   . Coronary artery bypass graft      4 vessel [facility].  Dr. Burns Spain 06/2000     Family History  Problem Relation Age of Onset  . Hypertension Father   . Heart Problems Father     bypass 1992  . CAD Mother   . Pancreatic cancer Mother   . Heart Problems Maternal Grandfather      Social History   Social History  . Marital Status: Married    Spouse Name: N/A  . Number of Children: 4  . Years of Education: N/A   Occupational History  . Beef Cattle Farmer    Social History Main Topics  . Smoking status: Never Smoker   . Smokeless tobacco: Not on file  . Alcohol Use: Not on file  . Drug Use: Not on file  . Sexual Activity: Not on file   Other Topics Concern  . Not on file   Social History Narrative   Lives with wife and pony and draft horses.       BP 138/80 mmHg  Pulse 56  Ht 5\' 10"  (1.778 m)  Wt 222 lb 12.8 oz (101.061 kg)  BMI 31.97 kg/m2  Physical Exam:  Well appearing 70 year old man, NAD HEENT: Unremarkable Neck:  6 cm JVD, no thyromegally Back:  No CVA tenderness Lungs:  Clear with no wheezes, rales, or rhonchi. HEART:  Regular rate rhythm, no murmurs, no rubs, no clicks Abd:  soft, positive bowel sounds, no organomegally, no rebound, no guarding Ext:  2 plus pulses, no edema, no cyanosis, no clubbing Skin:  No rashes no nodules Neuro:  CN II through XII intact, motor grossly intact   DEVICE  Normal device function.  See PaceArt for details. Device at [employer].   Assess/Plan: 1. ICD - his device is working normally but has reached ERI. I discussed the treatment options. He has undergone ankle surgery Will proceed with ICD gen change. 2. Chronic systolic  heart failure - his symptoms remain class 2. No change in meds. 3. ICM - he denies anginal symptoms. No change in meds. 4. Pneumonia - he has recovered. No change in meds.  Mikle Bosworth.D.

## 2016-04-29 DIAGNOSIS — M19072 Primary osteoarthritis, left ankle and foot: Secondary | ICD-10-CM | POA: Diagnosis not present

## 2016-04-29 DIAGNOSIS — M2142 Flat foot [pes planus] (acquired), left foot: Secondary | ICD-10-CM | POA: Diagnosis not present

## 2016-04-29 DIAGNOSIS — M2012 Hallux valgus (acquired), left foot: Secondary | ICD-10-CM | POA: Diagnosis not present

## 2016-04-29 DIAGNOSIS — S93135D Subluxation of interphalangeal joint of left lesser toe(s), subsequent encounter: Secondary | ICD-10-CM | POA: Diagnosis not present

## 2016-04-29 DIAGNOSIS — M6702 Short Achilles tendon (acquired), left ankle: Secondary | ICD-10-CM | POA: Diagnosis not present

## 2016-04-29 DIAGNOSIS — S93145D Subluxation of metatarsophalangeal joint of left lesser toe(s), subsequent encounter: Secondary | ICD-10-CM | POA: Diagnosis not present

## 2016-05-03 ENCOUNTER — Other Ambulatory Visit: Payer: Self-pay | Admitting: Cardiology

## 2016-05-04 MED ORDER — CEFAZOLIN SODIUM-DEXTROSE 2-4 GM/100ML-% IV SOLN
2.0000 g | INTRAVENOUS | Status: AC
  Administered 2016-05-05: 2 g via INTRAVENOUS
  Filled 2016-05-04: qty 100

## 2016-05-04 MED ORDER — SODIUM CHLORIDE 0.9 % IR SOLN
80.0000 mg | Status: AC
  Administered 2016-05-05: 80 mg
  Filled 2016-05-04: qty 2

## 2016-05-05 ENCOUNTER — Encounter (HOSPITAL_COMMUNITY): Payer: Self-pay | Admitting: Cardiology

## 2016-05-05 ENCOUNTER — Encounter (HOSPITAL_COMMUNITY)
Admission: RE | Disposition: A | Discharge status: Home / Self Care | Payer: Self-pay | Source: Ambulatory Visit | Attending: Internal Medicine

## 2016-05-05 ENCOUNTER — Ambulatory Visit (HOSPITAL_COMMUNITY)
Admission: RE | Admit: 2016-05-05 | Discharge: 2016-05-05 | Disposition: A | Discharge status: Home / Self Care | Payer: Medicare Other | Source: Ambulatory Visit | Attending: Internal Medicine | Admitting: Internal Medicine

## 2016-05-05 DIAGNOSIS — Z96612 Presence of left artificial shoulder joint: Secondary | ICD-10-CM | POA: Insufficient documentation

## 2016-05-05 DIAGNOSIS — M1990 Unspecified osteoarthritis, unspecified site: Secondary | ICD-10-CM | POA: Diagnosis not present

## 2016-05-05 DIAGNOSIS — Z955 Presence of coronary angioplasty implant and graft: Secondary | ICD-10-CM | POA: Diagnosis not present

## 2016-05-05 DIAGNOSIS — Z808 Family history of malignant neoplasm of other organs or systems: Secondary | ICD-10-CM | POA: Diagnosis not present

## 2016-05-05 DIAGNOSIS — Z96652 Presence of left artificial knee joint: Secondary | ICD-10-CM | POA: Insufficient documentation

## 2016-05-05 DIAGNOSIS — Z4502 Encounter for adjustment and management of automatic implantable cardiac defibrillator: Secondary | ICD-10-CM | POA: Insufficient documentation

## 2016-05-05 DIAGNOSIS — I110 Hypertensive heart disease with heart failure: Secondary | ICD-10-CM | POA: Diagnosis not present

## 2016-05-05 DIAGNOSIS — E785 Hyperlipidemia, unspecified: Secondary | ICD-10-CM | POA: Insufficient documentation

## 2016-05-05 DIAGNOSIS — I472 Ventricular tachycardia, unspecified: Secondary | ICD-10-CM | POA: Insufficient documentation

## 2016-05-05 DIAGNOSIS — Z7982 Long term (current) use of aspirin: Secondary | ICD-10-CM | POA: Insufficient documentation

## 2016-05-05 DIAGNOSIS — Z951 Presence of aortocoronary bypass graft: Secondary | ICD-10-CM | POA: Insufficient documentation

## 2016-05-05 DIAGNOSIS — Z96611 Presence of right artificial shoulder joint: Secondary | ICD-10-CM | POA: Insufficient documentation

## 2016-05-05 DIAGNOSIS — I255 Ischemic cardiomyopathy: Secondary | ICD-10-CM | POA: Diagnosis present

## 2016-05-05 DIAGNOSIS — I252 Old myocardial infarction: Secondary | ICD-10-CM | POA: Diagnosis not present

## 2016-05-05 DIAGNOSIS — Z8249 Family history of ischemic heart disease and other diseases of the circulatory system: Secondary | ICD-10-CM | POA: Diagnosis not present

## 2016-05-05 DIAGNOSIS — I2510 Atherosclerotic heart disease of native coronary artery without angina pectoris: Secondary | ICD-10-CM | POA: Diagnosis not present

## 2016-05-05 DIAGNOSIS — I509 Heart failure, unspecified: Secondary | ICD-10-CM | POA: Insufficient documentation

## 2016-05-05 HISTORY — PX: EP IMPLANTABLE DEVICE: SHX172B

## 2016-05-05 LAB — SURGICAL PCR SCREEN
MRSA, PCR: NEGATIVE
Staphylococcus aureus: POSITIVE — AB

## 2016-05-05 SURGERY — ICD/BIV ICD GENERATOR CHANGEOUT

## 2016-05-05 MED ORDER — MIDAZOLAM HCL 5 MG/5ML IJ SOLN
INTRAMUSCULAR | Status: AC
  Filled 2016-05-05: qty 5

## 2016-05-05 MED ORDER — MUPIROCIN 2 % EX OINT
TOPICAL_OINTMENT | CUTANEOUS | Status: AC
  Filled 2016-05-05: qty 22

## 2016-05-05 MED ORDER — LIDOCAINE HCL (PF) 1 % IJ SOLN
INTRAMUSCULAR | Status: DC | PRN
  Administered 2016-05-05: 31 mL

## 2016-05-05 MED ORDER — ONDANSETRON HCL 4 MG/2ML IJ SOLN: 4.0000 mg | Freq: Four times a day (QID) | INTRAMUSCULAR | Status: DC | PRN

## 2016-05-05 MED ORDER — MIDAZOLAM HCL 5 MG/5ML IJ SOLN
INTRAMUSCULAR | Status: DC | PRN
  Administered 2016-05-05 (×6): 1 mg via INTRAVENOUS

## 2016-05-05 MED ORDER — SODIUM CHLORIDE 0.9 % IV SOLN
INTRAVENOUS | Status: DC
  Administered 2016-05-05: 07:00:00 via INTRAVENOUS

## 2016-05-05 MED ORDER — CEFAZOLIN SODIUM-DEXTROSE 2-4 GM/100ML-% IV SOLN
INTRAVENOUS | Status: AC
  Filled 2016-05-05: qty 100

## 2016-05-05 MED ORDER — CHLORHEXIDINE GLUCONATE 4 % EX LIQD: 60.0000 mL | Freq: Once | CUTANEOUS | Status: DC

## 2016-05-05 MED ORDER — LIDOCAINE HCL (PF) 1 % IJ SOLN
INTRAMUSCULAR | Status: AC
  Filled 2016-05-05: qty 30

## 2016-05-05 MED ORDER — SODIUM CHLORIDE 0.9 % IR SOLN
Status: AC
  Filled 2016-05-05: qty 2

## 2016-05-05 MED ORDER — FENTANYL CITRATE (PF) 100 MCG/2ML IJ SOLN
INTRAMUSCULAR | Status: AC
  Filled 2016-05-05: qty 2

## 2016-05-05 MED ORDER — ACETAMINOPHEN 325 MG PO TABS: 325.0000 mg | ORAL_TABLET | ORAL | Status: DC | PRN

## 2016-05-05 MED ORDER — FENTANYL CITRATE (PF) 100 MCG/2ML IJ SOLN
INTRAMUSCULAR | Status: DC | PRN
  Administered 2016-05-05: 25 ug via INTRAVENOUS
  Administered 2016-05-05 (×4): 12.5 ug via INTRAVENOUS

## 2016-05-05 SURGICAL SUPPLY — 4 items
CABLE SURGICAL S-101-97-12 (CABLE) ×2 IMPLANT
ICD VISIA MRI DVFB1D1 (ICD Generator) ×2 IMPLANT
PAD DEFIB LIFELINK (PAD) ×2 IMPLANT
TRAY PACEMAKER INSERTION (PACKS) ×2 IMPLANT

## 2016-05-05 NOTE — Interval H&P Note (Signed)
History and Physical Interval Note:  05/05/2016 8:13 AM  Stephen Adams  has presented today for surgery, with the diagnosis of eri  The various methods of treatment have been discussed with the patient and family. After consideration of risks, benefits and other options for treatment, the patient has consented to  Procedure(s): ICD [employer] (N/A) as a surgical intervention .  The patient's history has been reviewed, patient examined, no change in status, stable for surgery.  I have reviewed the patient's chart and labs.  Questions were answered to the patient's satisfaction.     Cristopher Peru

## 2016-05-05 NOTE — Discharge Instructions (Signed)

## 2016-05-05 NOTE — H&P (View-Only) (Signed)
HPI Stephen Adams returns today for ongoing management of his ICD. He is a very pleasant 70 year old man with a long-standing history of ischemic heart disease, status post MI initially at age 20. He had undergone bypass surgery approximately 15 years ago. Prior to that he had had multiple coronary stents placed. The patient has never had syncope and denies any prior ICD shocks. In theinterim, he has undergone foot surgery and then developed pneumonia. He has had one episode of VT which terminated spontaneously and was below the device detection rate. He has class 2 CHF symptoms. He has reached ERI. Allergies  Allergen Reactions  . Oxycodone-Acetaminophen Nausea Only     Current Outpatient Prescriptions  Medication Sig Dispense Refill  . aspirin 81 MG tablet Take 81 mg by mouth daily.    . carvedilol (COREG) 6.25 MG tablet TAKE 1 TABLET BY MOUTH TWICE A DAY 180 tablet 1  . irbesartan-hydrochlorothiazide (AVALIDE) 150-12.5 MG tablet Take 1 tablet by mouth daily.    Marland Kitchen lovastatin (MEVACOR) 20 MG tablet Take 1 tablet (20 mg total) by mouth at bedtime. 90 tablet 3  . pantoprazole (PROTONIX) 40 MG tablet TAKE 1 TABLET BY MOUTH EVERY DAY 30 tablet 8   No current facility-administered medications for this visit.     Past Medical History  Diagnosis Date  . Hypertensive heart disease   . Hyperlipidemia   . CAD (coronary artery disease)     a. 1989 PTCA LAD; b. 1999 BMS to LAD & RCA; c. 2000 PTCA RCA 2/2 ISR; d. 10/1999 Inf MI/VF Arrest: 4 stents to RCA 2/2 ISR; e. 05/2001 Cath: ISR RCA, sev LAD dzs, EF 35-40%-->CABG (details unknown); f. 09/2013 MV: Inferior infarct, no ischemia.  . Ischemic cardiomyopathy     a. 02/2015 Echo: EF 25-30%, inf, apical AK, mid-apical-anteroseptal and ant HK, Gr1 DD, mild AI, mod dil LA.  . 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 10/2008 s/p MDT [employer] single lead AICD. Ser # [id number] H.  . Chronic systolic CHF (congestive  heart failure) ([place])     a. 02/2015 Echo: EF 25-30%.  . DJD (degenerative joint disease)     ROS:   All systems reviewed and negative except as noted in the HPI.   Past Surgical History  Procedure Laterality Date  . Total shoulder replacement Bilateral   . Total knee arthroplasty Left   . Coronary artery bypass graft      4 vessel [facility].  Dr. Burns Spain 06/2000     Family History  Problem Relation Age of Onset  . Hypertension Father   . Heart Problems Father     bypass 1992  . CAD Mother   . Pancreatic cancer Mother   . Heart Problems Maternal Grandfather      Social History   Social History  . Marital Status: Married    Spouse Name: N/A  . Number of Children: 4  . Years of Education: N/A   Occupational History  . Beef Cattle Farmer    Social History Main Topics  . Smoking status: Never Smoker   . Smokeless tobacco: Not on file  . Alcohol Use: Not on file  . Drug Use: Not on file  . Sexual Activity: Not on file   Other Topics Concern  . Not on file   Social History Narrative   Lives with wife and pony and draft horses.       BP 138/80 mmHg  Pulse 56  Ht 5\' 10"  (1.778 m)  Wt 222 lb 12.8 oz (101.061 kg)  BMI 31.97 kg/m2  Physical Exam:  Well appearing 70 year old man, NAD HEENT: Unremarkable Neck:  6 cm JVD, no thyromegally Back:  No CVA tenderness Lungs:  Clear with no wheezes, rales, or rhonchi. HEART:  Regular rate rhythm, no murmurs, no rubs, no clicks Abd:  soft, positive bowel sounds, no organomegally, no rebound, no guarding Ext:  2 plus pulses, no edema, no cyanosis, no clubbing Skin:  No rashes no nodules Neuro:  CN II through XII intact, motor grossly intact   DEVICE  Normal device function.  See PaceArt for details. Device at [employer].   Assess/Plan: 1. ICD - his device is working normally but has reached ERI. I discussed the treatment options. He has undergone ankle surgery Will proceed with ICD gen change. 2. Chronic systolic  heart failure - his symptoms remain class 2. No change in meds. 3. ICM - he denies anginal symptoms. No change in meds. 4. Pneumonia - he has recovered. No change in meds.  Stephen Adams.D.

## 2016-05-07 ENCOUNTER — Telehealth: Payer: Self-pay | Admitting: Internal Medicine

## 2016-05-07 NOTE — Telephone Encounter (Signed)
Returned call and he will let me know if recommendations do not help

## 2016-05-07 NOTE — Telephone Encounter (Signed)
Discussed with Dr Lovena Le and he suggested taking Benadryl and using Hydrocortisone cream on area.  I have left patient a voicemail to call me back to discuss

## 2016-05-07 NOTE — Telephone Encounter (Signed)
Follow Up:; ° ° °Returning your call. °

## 2016-05-07 NOTE — Telephone Encounter (Signed)
New message    Pt verbalized that he has a rash everywhere that he was shaved for the surgery of his device  Please call pt to see what he should do

## 2016-05-14 ENCOUNTER — Other Ambulatory Visit: Payer: Self-pay | Admitting: Cardiology

## 2016-05-18 ENCOUNTER — Ambulatory Visit (INDEPENDENT_AMBULATORY_CARE_PROVIDER_SITE_OTHER): Payer: Medicare Other | Admitting: *Deleted

## 2016-05-18 ENCOUNTER — Encounter: Payer: Self-pay | Admitting: Internal Medicine

## 2016-05-18 DIAGNOSIS — I255 Ischemic cardiomyopathy: Secondary | ICD-10-CM | POA: Diagnosis not present

## 2016-05-18 DIAGNOSIS — I5022 Chronic systolic (congestive) heart failure: Secondary | ICD-10-CM

## 2016-05-18 LAB — CUP PACEART INCLINIC DEVICE CHECK
Battery Remaining Longevity: 138 mo
Brady Statistic RV Percent Paced: 3.02 %
Date Time Interrogation Session: 20170807135649
HIGH POWER IMPEDANCE MEASURED VALUE: 42 Ohm
HighPow Impedance: 53 Ohm
Implantable Lead Model: 6947
Lead Channel Impedance Value: 342 Ohm
Lead Channel Impedance Value: 418 Ohm
Lead Channel Sensing Intrinsic Amplitude: 20.375 mV
Lead Channel Setting Pacing Amplitude: 2.75 V
Lead Channel Setting Pacing Pulse Width: 0.4 ms
Lead Channel Setting Sensing Sensitivity: 0.3 mV
MDC IDC LEAD IMPLANT DT: 20100104
MDC IDC LEAD LOCATION: 753860
MDC IDC MSMT BATTERY VOLTAGE: 3.16 V
MDC IDC MSMT LEADCHNL RV PACING THRESHOLD AMPLITUDE: 1.375 V
MDC IDC MSMT LEADCHNL RV PACING THRESHOLD PULSEWIDTH: 0.4 ms
MDC IDC MSMT LEADCHNL RV SENSING INTR AMPL: 14.875 mV

## 2016-05-18 NOTE — Progress Notes (Signed)
Wound check appointment. Steri-strips removed. Wound without redness or edema. Incision edges approximated, wound well healed. Normal device function. Threshold, sensing, and impedances consistent with implant measurements. Device programmed at appropriate safety margins. Histogram distribution appropriate for patient and level of activity. No ventricular arrhythmias noted. Patient educated about wound care, arm mobility, and shock plan. ROV in 3 months with GT/R.

## 2016-06-01 DIAGNOSIS — M6702 Short Achilles tendon (acquired), left ankle: Secondary | ICD-10-CM | POA: Diagnosis not present

## 2016-06-01 DIAGNOSIS — N390 Urinary tract infection, site not specified: Secondary | ICD-10-CM | POA: Diagnosis not present

## 2016-06-01 DIAGNOSIS — R972 Elevated prostate specific antigen [PSA]: Secondary | ICD-10-CM | POA: Diagnosis not present

## 2016-06-01 DIAGNOSIS — N400 Enlarged prostate without lower urinary tract symptoms: Secondary | ICD-10-CM | POA: Diagnosis not present

## 2016-06-03 DIAGNOSIS — M6702 Short Achilles tendon (acquired), left ankle: Secondary | ICD-10-CM | POA: Diagnosis not present

## 2016-06-03 DIAGNOSIS — G4733 Obstructive sleep apnea (adult) (pediatric): Secondary | ICD-10-CM | POA: Diagnosis not present

## 2016-06-08 DIAGNOSIS — M6702 Short Achilles tendon (acquired), left ankle: Secondary | ICD-10-CM | POA: Diagnosis not present

## 2016-06-10 DIAGNOSIS — M6702 Short Achilles tendon (acquired), left ankle: Secondary | ICD-10-CM | POA: Diagnosis not present

## 2016-06-12 DIAGNOSIS — M6702 Short Achilles tendon (acquired), left ankle: Secondary | ICD-10-CM | POA: Diagnosis not present

## 2016-06-17 DIAGNOSIS — M6702 Short Achilles tendon (acquired), left ankle: Secondary | ICD-10-CM | POA: Diagnosis not present

## 2016-06-19 DIAGNOSIS — M6702 Short Achilles tendon (acquired), left ankle: Secondary | ICD-10-CM | POA: Diagnosis not present

## 2016-06-24 DIAGNOSIS — M19072 Primary osteoarthritis, left ankle and foot: Secondary | ICD-10-CM | POA: Diagnosis not present

## 2016-06-24 DIAGNOSIS — S93135D Subluxation of interphalangeal joint of left lesser toe(s), subsequent encounter: Secondary | ICD-10-CM | POA: Diagnosis not present

## 2016-06-24 DIAGNOSIS — S93145D Subluxation of metatarsophalangeal joint of left lesser toe(s), subsequent encounter: Secondary | ICD-10-CM | POA: Diagnosis not present

## 2016-06-24 DIAGNOSIS — M2142 Flat foot [pes planus] (acquired), left foot: Secondary | ICD-10-CM | POA: Diagnosis not present

## 2016-06-24 DIAGNOSIS — M2012 Hallux valgus (acquired), left foot: Secondary | ICD-10-CM | POA: Diagnosis not present

## 2016-06-29 DIAGNOSIS — M6702 Short Achilles tendon (acquired), left ankle: Secondary | ICD-10-CM | POA: Diagnosis not present

## 2016-07-01 DIAGNOSIS — M6702 Short Achilles tendon (acquired), left ankle: Secondary | ICD-10-CM | POA: Diagnosis not present

## 2016-07-03 DIAGNOSIS — M6702 Short Achilles tendon (acquired), left ankle: Secondary | ICD-10-CM | POA: Diagnosis not present

## 2016-07-06 DIAGNOSIS — M6702 Short Achilles tendon (acquired), left ankle: Secondary | ICD-10-CM | POA: Diagnosis not present

## 2016-07-08 DIAGNOSIS — M6702 Short Achilles tendon (acquired), left ankle: Secondary | ICD-10-CM | POA: Diagnosis not present

## 2016-07-10 DIAGNOSIS — M6702 Short Achilles tendon (acquired), left ankle: Secondary | ICD-10-CM | POA: Diagnosis not present

## 2016-07-17 DIAGNOSIS — M6702 Short Achilles tendon (acquired), left ankle: Secondary | ICD-10-CM | POA: Diagnosis not present

## 2016-07-21 DIAGNOSIS — M6702 Short Achilles tendon (acquired), left ankle: Secondary | ICD-10-CM | POA: Diagnosis not present

## 2016-07-24 DIAGNOSIS — M6702 Short Achilles tendon (acquired), left ankle: Secondary | ICD-10-CM | POA: Diagnosis not present

## 2016-07-28 DIAGNOSIS — M6702 Short Achilles tendon (acquired), left ankle: Secondary | ICD-10-CM | POA: Diagnosis not present

## 2016-07-31 DIAGNOSIS — M6702 Short Achilles tendon (acquired), left ankle: Secondary | ICD-10-CM | POA: Diagnosis not present

## 2016-08-19 ENCOUNTER — Other Ambulatory Visit: Payer: Self-pay | Admitting: Cardiology

## 2016-08-19 NOTE — Telephone Encounter (Signed)
Rx has been sent to the pharmacy electronically. ° °

## 2016-08-24 ENCOUNTER — Encounter: Payer: Self-pay | Admitting: Internal Medicine

## 2016-08-24 ENCOUNTER — Ambulatory Visit (INDEPENDENT_AMBULATORY_CARE_PROVIDER_SITE_OTHER): Payer: Medicare Other | Admitting: Internal Medicine

## 2016-08-24 VITALS — BP 144/88 | HR 60 | Ht 70.0 in | Wt 223.0 lb

## 2016-08-24 DIAGNOSIS — I5022 Chronic systolic (congestive) heart failure: Secondary | ICD-10-CM

## 2016-08-24 DIAGNOSIS — Z95810 Presence of automatic (implantable) cardiac defibrillator: Secondary | ICD-10-CM | POA: Diagnosis not present

## 2016-08-24 DIAGNOSIS — I2589 Other forms of chronic ischemic heart disease: Secondary | ICD-10-CM

## 2016-08-24 DIAGNOSIS — I255 Ischemic cardiomyopathy: Secondary | ICD-10-CM | POA: Diagnosis not present

## 2016-08-24 LAB — CUP PACEART INCLINIC DEVICE CHECK
Battery Voltage: 3.13 V
Brady Statistic RV Percent Paced: 10.32 %
Date Time Interrogation Session: 20171113113300
HighPow Impedance: 41 Ohm
HighPow Impedance: 53 Ohm
Implantable Lead Location: 753860
Implantable Pulse Generator Implant Date: 20170725
Lead Channel Pacing Threshold Amplitude: 1.25 V
Lead Channel Sensing Intrinsic Amplitude: 16.875 mV
Lead Channel Setting Pacing Amplitude: 2.5 V
MDC IDC LEAD IMPLANT DT: 20100104
MDC IDC MSMT BATTERY REMAINING LONGEVITY: 136 mo
MDC IDC MSMT LEADCHNL RV IMPEDANCE VALUE: 304 Ohm
MDC IDC MSMT LEADCHNL RV IMPEDANCE VALUE: 399 Ohm
MDC IDC MSMT LEADCHNL RV PACING THRESHOLD PULSEWIDTH: 0.4 ms
MDC IDC SET LEADCHNL RV PACING PULSEWIDTH: 0.4 ms
MDC IDC SET LEADCHNL RV SENSING SENSITIVITY: 0.3 mV

## 2016-08-24 NOTE — Progress Notes (Signed)
HPI Stephen Adams returns today for ongoing management of his ICD. He is a very pleasant 70 year old man with a long-standing history of ischemic heart disease, VT, status post MI initially at age 63. He has undergone CABG in past.  He has class 2 CHF symptoms. He has undergone ICD generator change out several months ago. In the interim he has done well. He denies chest pin. No ICD shock. He did not feel his NSVT. Allergies  Allergen Reactions  . Oxycodone-Acetaminophen Nausea Only     Current Outpatient Prescriptions  Medication Sig Dispense Refill  . aspirin 81 MG tablet Take 81 mg by mouth daily.    . carvedilol (COREG) 6.25 MG tablet TAKE 1 TABLET BY MOUTH TWICE A DAY 180 tablet 0  . Cholecalciferol (VITAMIN D-3 PO) Take 2 capsules by mouth daily.    . irbesartan-hydrochlorothiazide (AVALIDE) 150-12.5 MG tablet TAKE 1 TABLET BY MOUTH EVERY DAY 90 tablet 0  . lovastatin (MEVACOR) 20 MG tablet Take 1 tablet (20 mg total) by mouth at bedtime. 90 tablet 3  . pantoprazole (PROTONIX) 40 MG tablet TAKE 1 TABLET BY MOUTH EVERY DAY 30 tablet 3   No current facility-administered medications for this visit.      Past Medical History:  Diagnosis Date  . CAD (coronary artery disease)    a. 1989 PTCA LAD; b. 1999 BMS to LAD & RCA; c. 2000 PTCA RCA 2/2 ISR; d. 10/1999 Inf MI/VF Arrest: 4 stents to RCA 2/2 ISR; e. 05/2001 Cath: ISR RCA, sev LAD dzs, EF 35-40%-->CABG (details unknown); f. 09/2013 MV: Inferior infarct, no ischemia.  . Chronic systolic CHF (congestive heart failure) ([place])    a. 02/2015 Echo: EF 25-30%.  . DJD (degenerative joint disease)   . Hyperlipidemia   . Hypertensive heart disease   . 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 10/2008 s/p MDT [employer] single lead AICD. Ser # [id number] H.  . Ischemic cardiomyopathy    a. 02/2015 Echo: EF 25-30%, inf, apical AK, mid-apical-anteroseptal and ant HK, Gr1 DD, mild AI, mod dil LA.    ROS:   All  systems reviewed and negative except as noted in the HPI.   Past Surgical History:  Procedure Laterality Date  . CORONARY ARTERY BYPASS GRAFT     4 vessel [facility].  Dr. Burns Spain 06/2000  . EP IMPLANTABLE DEVICE N/A 05/05/2016   Procedure: ICD Generator Changeout;  Surgeon: Evans Lance, MD;  Location: [facility];  Service: Cardiovascular;  Laterality: N/A;  . TOTAL KNEE ARTHROPLASTY Left   . TOTAL SHOULDER REPLACEMENT Bilateral      Family History  Problem Relation Age of Onset  . Hypertension Father   . Heart Problems Father     bypass 1992  . CAD Mother   . Pancreatic cancer Mother   . Heart Problems Maternal Grandfather      Social History   Social History  . Marital status: Married    Spouse name: N/A  . Number of children: 4  . Years of education: N/A   Occupational History  . Beef Cattle Farmer    Social History Main Topics  . Smoking status: Never Smoker  . Smokeless tobacco: Not on file  . Alcohol use Not on file  . Drug use: Unknown  . Sexual activity: Not on file   Other Topics Concern  . Not on file   Social History Narrative   Lives with wife and pony and  draft horses.       BP (!) 144/88   Pulse 60   Ht 5\' 10"  (1.778 m)   Wt 223 lb (101.2 kg)   SpO2 94%   BMI 32.00 kg/m   Physical Exam:  Well appearing 70 year old man, NAD HEENT: Unremarkable Neck:  6 cm JVD, no thyromegally Back:  No CVA tenderness Lungs:  Clear with no wheezes, rales, or rhonchi. HEART:  Regular rate rhythm, no murmurs, no rubs, no clicks Abd:  soft, positive bowel sounds, no organomegally, no rebound, no guarding Ext:  2 plus pulses, no edema, no cyanosis, no clubbing Skin:  No rashes no nodules Neuro:  CN II through XII intact, motor grossly intact   DEVICE  Normal device function.  See PaceArt for details.   Assess/Plan: 1. ICD - his device is working normally s/p ICD gen change.  2. Chronic systolic heart failure - his symptoms remain class  2. No change in meds. 3. ICM - he denies anginal symptoms. No change in meds. 4. VT - today we added some ATP therapy as he has had VT at over 180/min which was long but stopped on it's own.   Stephen Adams.D.

## 2016-08-24 NOTE — Patient Instructions (Signed)
Your physician wants you to follow-up in: 9 Months with Dr. Lovena Le.  You will receive a reminder letter in the mail two months in advance. If you don't receive a letter, please call our office to schedule the follow-up appointment.  Remote monitoring is used to monitor your Pacemaker of ICD from home. This monitoring reduces the number of office visits required to check your device to one time per year. It allows [place] to keep an eye on the functioning of your device to ensure it is working properly. You are scheduled for a device check from home on 11/23/16. You may send your transmission at any time that day. If you have a wireless device, the transmission will be sent automatically. After your physician reviews your transmission, you will receive a postcard with your next transmission date.  Your physician recommends that you continue on your current medications as directed. Please refer to the Current Medication list given to you today.  Thank you for choosing [facility]!

## 2016-09-14 DIAGNOSIS — S93135D Subluxation of interphalangeal joint of left lesser toe(s), subsequent encounter: Secondary | ICD-10-CM | POA: Diagnosis not present

## 2016-09-14 DIAGNOSIS — M2012 Hallux valgus (acquired), left foot: Secondary | ICD-10-CM | POA: Diagnosis not present

## 2016-09-14 DIAGNOSIS — M19071 Primary osteoarthritis, right ankle and foot: Secondary | ICD-10-CM | POA: Diagnosis not present

## 2016-09-14 DIAGNOSIS — X58XXXD Exposure to other specified factors, subsequent encounter: Secondary | ICD-10-CM | POA: Diagnosis not present

## 2016-09-14 DIAGNOSIS — Z981 Arthrodesis status: Secondary | ICD-10-CM | POA: Diagnosis not present

## 2016-09-14 DIAGNOSIS — S93145D Subluxation of metatarsophalangeal joint of left lesser toe(s), subsequent encounter: Secondary | ICD-10-CM | POA: Diagnosis not present

## 2016-09-14 DIAGNOSIS — M2142 Flat foot [pes planus] (acquired), left foot: Secondary | ICD-10-CM | POA: Diagnosis not present

## 2016-09-14 DIAGNOSIS — M19072 Primary osteoarthritis, left ankle and foot: Secondary | ICD-10-CM | POA: Diagnosis not present

## 2016-09-17 ENCOUNTER — Other Ambulatory Visit: Payer: Self-pay | Admitting: Cardiology

## 2016-09-22 ENCOUNTER — Other Ambulatory Visit: Payer: Self-pay | Admitting: Internal Medicine

## 2016-09-22 DIAGNOSIS — Z95810 Presence of automatic (implantable) cardiac defibrillator: Secondary | ICD-10-CM | POA: Diagnosis not present

## 2016-09-22 DIAGNOSIS — E559 Vitamin D deficiency, unspecified: Secondary | ICD-10-CM | POA: Diagnosis not present

## 2016-09-22 DIAGNOSIS — E785 Hyperlipidemia, unspecified: Secondary | ICD-10-CM | POA: Diagnosis not present

## 2016-09-22 DIAGNOSIS — Z0001 Encounter for general adult medical examination with abnormal findings: Secondary | ICD-10-CM | POA: Diagnosis not present

## 2016-09-22 DIAGNOSIS — R319 Hematuria, unspecified: Secondary | ICD-10-CM

## 2016-09-22 DIAGNOSIS — Z79899 Other long term (current) drug therapy: Secondary | ICD-10-CM | POA: Diagnosis not present

## 2016-09-22 DIAGNOSIS — Z125 Encounter for screening for malignant neoplasm of prostate: Secondary | ICD-10-CM | POA: Diagnosis not present

## 2016-09-22 DIAGNOSIS — I2510 Atherosclerotic heart disease of native coronary artery without angina pectoris: Secondary | ICD-10-CM | POA: Diagnosis not present

## 2016-09-22 DIAGNOSIS — I10 Essential (primary) hypertension: Secondary | ICD-10-CM | POA: Diagnosis not present

## 2016-09-23 DIAGNOSIS — M2012 Hallux valgus (acquired), left foot: Secondary | ICD-10-CM | POA: Diagnosis not present

## 2016-09-23 DIAGNOSIS — M2142 Flat foot [pes planus] (acquired), left foot: Secondary | ICD-10-CM | POA: Diagnosis not present

## 2016-09-23 DIAGNOSIS — M79672 Pain in left foot: Secondary | ICD-10-CM | POA: Diagnosis not present

## 2016-09-23 DIAGNOSIS — M19072 Primary osteoarthritis, left ankle and foot: Secondary | ICD-10-CM | POA: Diagnosis not present

## 2016-09-24 ENCOUNTER — Ambulatory Visit
Admission: RE | Admit: 2016-09-24 | Discharge: 2016-09-24 | Disposition: A | Discharge status: Home / Self Care | Payer: Medicare Other | Source: Ambulatory Visit | Attending: Internal Medicine | Admitting: Internal Medicine

## 2016-09-24 DIAGNOSIS — R319 Hematuria, unspecified: Secondary | ICD-10-CM

## 2016-09-24 DIAGNOSIS — N281 Cyst of kidney, acquired: Secondary | ICD-10-CM | POA: Diagnosis not present

## 2016-10-01 ENCOUNTER — Other Ambulatory Visit: Payer: Self-pay | Admitting: Cardiology

## 2016-10-13 DIAGNOSIS — Z1212 Encounter for screening for malignant neoplasm of rectum: Secondary | ICD-10-CM | POA: Diagnosis not present

## 2016-10-13 DIAGNOSIS — Z1211 Encounter for screening for malignant neoplasm of colon: Secondary | ICD-10-CM | POA: Diagnosis not present

## 2016-11-05 ENCOUNTER — Other Ambulatory Visit: Payer: Self-pay | Admitting: Cardiology

## 2016-11-23 ENCOUNTER — Ambulatory Visit (INDEPENDENT_AMBULATORY_CARE_PROVIDER_SITE_OTHER): Payer: Medicare Other | Admitting: *Deleted

## 2016-11-23 DIAGNOSIS — I255 Ischemic cardiomyopathy: Secondary | ICD-10-CM | POA: Diagnosis not present

## 2016-11-23 DIAGNOSIS — I5022 Chronic systolic (congestive) heart failure: Secondary | ICD-10-CM

## 2016-11-24 ENCOUNTER — Encounter: Payer: Self-pay | Admitting: Cardiology

## 2016-11-24 LAB — CUP PACEART REMOTE DEVICE CHECK
Brady Statistic RV Percent Paced: 19.88 %
HIGH POWER IMPEDANCE MEASURED VALUE: 54 Ohm
HighPow Impedance: 44 Ohm
Implantable Lead Location: 753860
Lead Channel Impedance Value: 399 Ohm
Lead Channel Pacing Threshold Pulse Width: 0.4 ms
Lead Channel Sensing Intrinsic Amplitude: 14.375 mV
Lead Channel Sensing Intrinsic Amplitude: 14.375 mV
Lead Channel Setting Pacing Amplitude: 3 V
Lead Channel Setting Pacing Pulse Width: 0.4 ms
MDC IDC LEAD IMPLANT DT: 20100104
MDC IDC MSMT BATTERY REMAINING LONGEVITY: 134 mo
MDC IDC MSMT BATTERY VOLTAGE: 3.08 V
MDC IDC MSMT LEADCHNL RV IMPEDANCE VALUE: 304 Ohm
MDC IDC MSMT LEADCHNL RV PACING THRESHOLD AMPLITUDE: 1.5 V
MDC IDC PG IMPLANT DT: 20170725
MDC IDC SESS DTM: 20180212051804
MDC IDC SET LEADCHNL RV SENSING SENSITIVITY: 0.3 mV

## 2016-11-24 NOTE — Progress Notes (Signed)
Remote ICD transmission.   

## 2017-01-02 ENCOUNTER — Other Ambulatory Visit: Payer: Self-pay | Admitting: Cardiology

## 2017-01-04 NOTE — Telephone Encounter (Signed)
Rx(s) sent to pharmacy electronically.  

## 2017-02-03 DIAGNOSIS — M6702 Short Achilles tendon (acquired), left ankle: Secondary | ICD-10-CM | POA: Diagnosis not present

## 2017-02-03 DIAGNOSIS — M2142 Flat foot [pes planus] (acquired), left foot: Secondary | ICD-10-CM | POA: Diagnosis not present

## 2017-02-03 DIAGNOSIS — S93145D Subluxation of metatarsophalangeal joint of left lesser toe(s), subsequent encounter: Secondary | ICD-10-CM | POA: Diagnosis not present

## 2017-02-03 DIAGNOSIS — M19072 Primary osteoarthritis, left ankle and foot: Secondary | ICD-10-CM | POA: Diagnosis not present

## 2017-02-03 DIAGNOSIS — M6240 Contracture of muscle, unspecified site: Secondary | ICD-10-CM | POA: Diagnosis not present

## 2017-02-03 DIAGNOSIS — M2012 Hallux valgus (acquired), left foot: Secondary | ICD-10-CM | POA: Diagnosis not present

## 2017-02-12 DIAGNOSIS — M1811 Unilateral primary osteoarthritis of first carpometacarpal joint, right hand: Secondary | ICD-10-CM | POA: Diagnosis not present

## 2017-02-12 DIAGNOSIS — G5603 Carpal tunnel syndrome, bilateral upper limbs: Secondary | ICD-10-CM | POA: Diagnosis not present

## 2017-02-12 DIAGNOSIS — M1812 Unilateral primary osteoarthritis of first carpometacarpal joint, left hand: Secondary | ICD-10-CM | POA: Diagnosis not present

## 2017-02-16 DIAGNOSIS — M180 Bilateral primary osteoarthritis of first carpometacarpal joints: Secondary | ICD-10-CM | POA: Insufficient documentation

## 2017-02-16 DIAGNOSIS — G5603 Carpal tunnel syndrome, bilateral upper limbs: Secondary | ICD-10-CM | POA: Insufficient documentation

## 2017-02-18 DIAGNOSIS — R1031 Right lower quadrant pain: Secondary | ICD-10-CM | POA: Diagnosis not present

## 2017-02-18 DIAGNOSIS — R42 Dizziness and giddiness: Secondary | ICD-10-CM | POA: Diagnosis not present

## 2017-02-22 ENCOUNTER — Ambulatory Visit (INDEPENDENT_AMBULATORY_CARE_PROVIDER_SITE_OTHER): Payer: Medicare Other | Admitting: *Deleted

## 2017-02-22 DIAGNOSIS — I255 Ischemic cardiomyopathy: Secondary | ICD-10-CM | POA: Diagnosis not present

## 2017-02-22 DIAGNOSIS — I5022 Chronic systolic (congestive) heart failure: Secondary | ICD-10-CM

## 2017-02-22 NOTE — Progress Notes (Signed)
Remote ICD transmission.   

## 2017-02-23 ENCOUNTER — Encounter: Payer: Self-pay | Admitting: Cardiology

## 2017-02-24 LAB — CUP PACEART REMOTE DEVICE CHECK
Battery Remaining Longevity: 132 mo
Date Time Interrogation Session: 20180514052205
HIGH POWER IMPEDANCE MEASURED VALUE: 54 Ohm
HighPow Impedance: 45 Ohm
Implantable Lead Implant Date: 20100104
Implantable Lead Location: 753860
Implantable Pulse Generator Implant Date: 20170725
Lead Channel Pacing Threshold Amplitude: 1.25 V
Lead Channel Pacing Threshold Pulse Width: 0.4 ms
Lead Channel Sensing Intrinsic Amplitude: 14.5 mV
Lead Channel Sensing Intrinsic Amplitude: 14.5 mV
Lead Channel Setting Pacing Amplitude: 2.75 V
MDC IDC MSMT BATTERY VOLTAGE: 3.05 V
MDC IDC MSMT LEADCHNL RV IMPEDANCE VALUE: 361 Ohm
MDC IDC MSMT LEADCHNL RV IMPEDANCE VALUE: 418 Ohm
MDC IDC SET LEADCHNL RV PACING PULSEWIDTH: 0.4 ms
MDC IDC SET LEADCHNL RV SENSING SENSITIVITY: 0.3 mV
MDC IDC STAT BRADY RV PERCENT PACED: 23.62 %

## 2017-03-17 DIAGNOSIS — R1031 Right lower quadrant pain: Secondary | ICD-10-CM | POA: Diagnosis not present

## 2017-03-23 DIAGNOSIS — R1031 Right lower quadrant pain: Secondary | ICD-10-CM | POA: Diagnosis not present

## 2017-04-07 DIAGNOSIS — N401 Enlarged prostate with lower urinary tract symptoms: Secondary | ICD-10-CM | POA: Diagnosis not present

## 2017-04-07 DIAGNOSIS — N138 Other obstructive and reflux uropathy: Secondary | ICD-10-CM | POA: Diagnosis not present

## 2017-04-16 DIAGNOSIS — I714 Abdominal aortic aneurysm, without rupture, unspecified: Secondary | ICD-10-CM | POA: Insufficient documentation

## 2017-05-07 ENCOUNTER — Other Ambulatory Visit: Payer: Self-pay | Admitting: Cardiology

## 2017-05-10 ENCOUNTER — Telehealth: Payer: Self-pay | Admitting: Cardiology

## 2017-05-10 DIAGNOSIS — Z87891 Personal history of nicotine dependence: Secondary | ICD-10-CM | POA: Diagnosis not present

## 2017-05-10 DIAGNOSIS — I10 Essential (primary) hypertension: Secondary | ICD-10-CM | POA: Diagnosis not present

## 2017-05-10 DIAGNOSIS — I493 Ventricular premature depolarization: Secondary | ICD-10-CM | POA: Diagnosis not present

## 2017-05-10 DIAGNOSIS — Z79899 Other long term (current) drug therapy: Secondary | ICD-10-CM | POA: Diagnosis not present

## 2017-05-10 DIAGNOSIS — T82198A Other mechanical complication of other cardiac electronic device, initial encounter: Secondary | ICD-10-CM | POA: Diagnosis not present

## 2017-05-10 DIAGNOSIS — Z95811 Presence of heart assist device: Secondary | ICD-10-CM | POA: Diagnosis not present

## 2017-05-10 DIAGNOSIS — Z7982 Long term (current) use of aspirin: Secondary | ICD-10-CM | POA: Diagnosis not present

## 2017-05-10 DIAGNOSIS — R079 Chest pain, unspecified: Secondary | ICD-10-CM | POA: Diagnosis not present

## 2017-05-10 DIAGNOSIS — I440 Atrioventricular block, first degree: Secondary | ICD-10-CM | POA: Diagnosis not present

## 2017-05-10 DIAGNOSIS — I714 Abdominal aortic aneurysm, without rupture: Secondary | ICD-10-CM | POA: Diagnosis not present

## 2017-05-10 DIAGNOSIS — E785 Hyperlipidemia, unspecified: Secondary | ICD-10-CM | POA: Diagnosis not present

## 2017-05-10 DIAGNOSIS — I252 Old myocardial infarction: Secondary | ICD-10-CM | POA: Diagnosis not present

## 2017-05-10 NOTE — Telephone Encounter (Signed)
Patient called and stated that heard an alert tone from his device over the weekend. Pt is in [place] and his home monitor is not with him. Instructed pt to go to the nearest ER and have his device checked. Pt verbalized understanding.

## 2017-05-24 ENCOUNTER — Ambulatory Visit (INDEPENDENT_AMBULATORY_CARE_PROVIDER_SITE_OTHER): Payer: Medicare Other | Admitting: *Deleted

## 2017-05-24 DIAGNOSIS — I255 Ischemic cardiomyopathy: Secondary | ICD-10-CM

## 2017-05-24 DIAGNOSIS — I5022 Chronic systolic (congestive) heart failure: Secondary | ICD-10-CM

## 2017-05-26 ENCOUNTER — Telehealth: Payer: Self-pay | Admitting: Internal Medicine

## 2017-05-26 LAB — CUP PACEART REMOTE DEVICE CHECK
Date Time Interrogation Session: 20180815022305
HIGH POWER IMPEDANCE MEASURED VALUE: 46 Ohm
HIGH POWER IMPEDANCE MEASURED VALUE: 55 Ohm
Implantable Lead Implant Date: 20100104
Lead Channel Pacing Threshold Amplitude: 1.375 V
Lead Channel Pacing Threshold Pulse Width: 0.4 ms
Lead Channel Sensing Intrinsic Amplitude: 14.125 mV
MDC IDC LEAD LOCATION: 753860
MDC IDC MSMT BATTERY REMAINING LONGEVITY: 130 mo
MDC IDC MSMT BATTERY VOLTAGE: 3.01 V
MDC IDC MSMT LEADCHNL RV IMPEDANCE VALUE: 342 Ohm
MDC IDC MSMT LEADCHNL RV IMPEDANCE VALUE: 399 Ohm
MDC IDC MSMT LEADCHNL RV SENSING INTR AMPL: 14.125 mV
MDC IDC PG IMPLANT DT: 20170725
MDC IDC SET LEADCHNL RV PACING AMPLITUDE: 2.75 V
MDC IDC SET LEADCHNL RV PACING PULSEWIDTH: 0.4 ms
MDC IDC SET LEADCHNL RV SENSING SENSITIVITY: 0.3 mV
MDC IDC STAT BRADY RV PERCENT PACED: 30.83 %

## 2017-05-26 NOTE — Telephone Encounter (Signed)
New message    Pt has a defib and states he is going to get his teeth cleaned soon, wants to know if he should be prescribed an antibiotic because of the defib?  Requests a call back.

## 2017-05-26 NOTE — Telephone Encounter (Signed)
Call placed to Pt.  Notified Pt does not have to take an antibiotic prior to teeth cleaning per device clinic.  Pt indicates understanding.

## 2017-05-26 NOTE — Progress Notes (Signed)
Remote defibrillator check.  

## 2017-06-03 ENCOUNTER — Other Ambulatory Visit: Payer: Self-pay | Admitting: Cardiology

## 2017-06-03 ENCOUNTER — Encounter: Payer: Self-pay | Admitting: Cardiology

## 2017-06-10 DIAGNOSIS — L237 Allergic contact dermatitis due to plants, except food: Secondary | ICD-10-CM | POA: Diagnosis not present

## 2017-06-23 DIAGNOSIS — G4733 Obstructive sleep apnea (adult) (pediatric): Secondary | ICD-10-CM | POA: Diagnosis not present

## 2017-06-29 ENCOUNTER — Telehealth: Payer: Self-pay | Admitting: Cardiology

## 2017-06-29 ENCOUNTER — Other Ambulatory Visit: Payer: Self-pay | Admitting: Cardiology

## 2017-06-29 NOTE — Telephone Encounter (Signed)
Rx(s) sent to pharmacy electronically.  

## 2017-06-29 NOTE — Telephone Encounter (Signed)
Patient called and stated that he heard an alert tone from his device. He is in [place] and he does not have his home monitor. Pt will be back home on late Wednesday 06-30-17. He is going to send a remote transmission when he gets home. Pt is aware that he will probably hear from our office on Thursday 07-01-17.

## 2017-06-30 ENCOUNTER — Telehealth: Payer: Self-pay | Admitting: Cardiology

## 2017-06-30 NOTE — Telephone Encounter (Signed)
Called the patient and LVM to call back to schedule his appointment with Dr. Percival Spanish.

## 2017-07-01 NOTE — Telephone Encounter (Signed)
LMTCB//sss 

## 2017-07-01 NOTE — Telephone Encounter (Signed)
Follow up     Patient calling back to get device results. Please call

## 2017-07-01 NOTE — Telephone Encounter (Signed)
Informed patient that his device was alarming bc there was an AF alert and the device was unable to send a remote transmission. Patient verbalized understanding. Appt made for tomorrow at 1130 w/Device clinic.

## 2017-07-02 ENCOUNTER — Ambulatory Visit (INDEPENDENT_AMBULATORY_CARE_PROVIDER_SITE_OTHER): Payer: Self-pay | Admitting: *Deleted

## 2017-07-02 DIAGNOSIS — I5022 Chronic systolic (congestive) heart failure: Secondary | ICD-10-CM

## 2017-07-02 DIAGNOSIS — I255 Ischemic cardiomyopathy: Secondary | ICD-10-CM

## 2017-07-02 LAB — CUP PACEART INCLINIC DEVICE CHECK
Battery Remaining Longevity: 128 mo
Brady Statistic RV Percent Paced: 32.9 %
Date Time Interrogation Session: 20180921133515
Implantable Lead Location: 753860
Implantable Lead Model: 6947
Implantable Pulse Generator Implant Date: 20170725
Lead Channel Impedance Value: 399 Ohm
Lead Channel Pacing Threshold Pulse Width: 0.4 ms
MDC IDC LEAD IMPLANT DT: 20100104
MDC IDC MSMT LEADCHNL RV PACING THRESHOLD AMPLITUDE: 1.25 V
MDC IDC MSMT LEADCHNL RV SENSING INTR AMPL: 15.3 mV
MDC IDC SET LEADCHNL RV PACING AMPLITUDE: 3 V
MDC IDC SET LEADCHNL RV PACING PULSEWIDTH: 0.4 ms
MDC IDC SET LEADCHNL RV SENSING SENSITIVITY: 0.3 mV

## 2017-07-02 NOTE — Progress Notes (Signed)
ICD check in clinic d/t audible alert for unsuccessful CL transmission. Normal device function. Threshold and sensing consistent with previous device measurements. Impedance trends stable over time. No evidence of any ventricular arrhythmias. 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 Histogram distribution appropriate for patient and level of activity. Stable thoracic impedance. AF daily burden time increased from 6hrs to 12hrs d/t frequent false positives. Device programmed at appropriate safety margins. Device programmed to optimize intrinsic conduction. Estimated longevity 10.8 years. Pt will follow up with GT as scheduled.

## 2017-07-06 ENCOUNTER — Other Ambulatory Visit: Payer: Self-pay | Admitting: Internal Medicine

## 2017-07-08 ENCOUNTER — Encounter: Payer: Self-pay | Admitting: Internal Medicine

## 2017-07-08 ENCOUNTER — Ambulatory Visit (INDEPENDENT_AMBULATORY_CARE_PROVIDER_SITE_OTHER): Payer: Medicare Other | Admitting: Internal Medicine

## 2017-07-08 VITALS — BP 128/78 | HR 64 | Ht 70.0 in | Wt 216.0 lb

## 2017-07-08 DIAGNOSIS — I255 Ischemic cardiomyopathy: Secondary | ICD-10-CM | POA: Diagnosis not present

## 2017-07-08 DIAGNOSIS — I5022 Chronic systolic (congestive) heart failure: Secondary | ICD-10-CM

## 2017-07-08 DIAGNOSIS — I472 Ventricular tachycardia, unspecified: Secondary | ICD-10-CM

## 2017-07-08 DIAGNOSIS — I2589 Other forms of chronic ischemic heart disease: Secondary | ICD-10-CM

## 2017-07-08 LAB — CUP PACEART INCLINIC DEVICE CHECK
HIGH POWER IMPEDANCE MEASURED VALUE: 50 Ohm
HighPow Impedance: 42 Ohm
Implantable Lead Implant Date: 20100104
Implantable Pulse Generator Implant Date: 20170725
Lead Channel Impedance Value: 304 Ohm
Lead Channel Pacing Threshold Amplitude: 1.125 V
Lead Channel Pacing Threshold Pulse Width: 0.4 ms
Lead Channel Sensing Intrinsic Amplitude: 14.5 mV
Lead Channel Sensing Intrinsic Amplitude: 19.375 mV
MDC IDC LEAD LOCATION: 753860
MDC IDC MSMT BATTERY REMAINING LONGEVITY: 131 mo
MDC IDC MSMT BATTERY VOLTAGE: 3.01 V
MDC IDC MSMT LEADCHNL RV IMPEDANCE VALUE: 361 Ohm
MDC IDC SESS DTM: 20180927123740
MDC IDC SET LEADCHNL RV PACING AMPLITUDE: 2.5 V
MDC IDC SET LEADCHNL RV PACING PULSEWIDTH: 0.4 ms
MDC IDC SET LEADCHNL RV SENSING SENSITIVITY: 0.3 mV
MDC IDC STAT BRADY RV PERCENT PACED: 21.69 %

## 2017-07-08 NOTE — Patient Instructions (Signed)
Medication Instructions:  Your physician recommends that you continue on your current medications as directed. Please refer to the Current Medication list given to you today.   Labwork: NONE   Testing/Procedures: NONE   Follow-Up: Your physician wants you to follow-up in: 1 Year with Dr. Lovena Le.  You will receive a reminder letter in the mail two months in advance. If you don't receive a letter, please call our office to schedule the follow-up appointment.  Remote monitoring is used to monitor your Pacemaker of ICD from home. This monitoring reduces the number of office visits required to check your device to one time per year. It allows [place] to keep an eye on the functioning of your device to ensure it is working properly. You are scheduled for a device check from home on 08/24/17. You may send your transmission at any time that day. If you have a wireless device, the transmission will be sent automatically. After your physician reviews your transmission, you will receive a postcard with your next transmission date.   Any Other Special Instructions Will Be Listed Below (If Applicable).     If you need a refill on your cardiac medications before your next appointment, please call your pharmacy. Thank you for choosing [place]!

## 2017-07-08 NOTE — Progress Notes (Signed)
HPI Mr. Madole returns today for ongoing evaluation and management of his ICD and systolic heart failure. He was in [place] and received an alarm on his ICD which diagnosed falsely atrial fib. He has conduction system disease and PVCs which resulted in the device thinking that he was in atrial fib. Over the past several months, he has had progression of his conduction system disease and some PVC's. 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 His CHF symptoms are class 1-2. He is still able to walk with his wife who has no heart problems. Denies edema. No ICD shock. Allergies  Allergen Reactions  . Oxycodone-Acetaminophen Nausea Only     Current Outpatient Prescriptions  Medication Sig Dispense Refill  . aspirin 81 MG tablet Take 81 mg by mouth daily.    . carvedilol (COREG) 6.25 MG tablet TAKE 1 TABLET BY MOUTH TWICE A DAY 180 tablet 0  . Cholecalciferol (VITAMIN D-3 PO) Take 2 capsules by mouth daily.    . irbesartan-hydrochlorothiazide (AVALIDE) 150-12.5 MG tablet TAKE 1 TABLET BY MOUTH DAILY. 90 tablet 0  . lovastatin (MEVACOR) 20 MG tablet TAKE 1 TABLET (20 MG TOTAL) BY MOUTH AT BEDTIME. 90 tablet 0  . pantoprazole (PROTONIX) 40 MG tablet Take 1 tablet (40 mg total) by mouth daily. PLEASE CONTACT OFFICE FOR ADDITIONAL REFILLS 15 tablet 0   No current facility-administered medications for this visit.      Past Medical History:  Diagnosis Date  . CAD (coronary artery disease)    a. 1989 PTCA LAD; b. 1999 BMS to LAD & RCA; c. 2000 PTCA RCA 2/2 ISR; d. 10/1999 Inf MI/VF Arrest: 4 stents to RCA 2/2 ISR; e. 05/2001 Cath: ISR RCA, sev LAD dzs, EF 35-40%-->CABG (details unknown); f. 09/2013 MV: Inferior infarct, no ischemia.  . Chronic systolic CHF (congestive heart failure) ([place])    a. 02/2015 Echo: EF 25-30%.  . DJD (degenerative joint disease)   . Hyperlipidemia   . Hypertensive heart disease   . 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  10/2008 s/p MDT [employer] DR single lead AICD. Ser # [id number] H.  . Ischemic cardiomyopathy    a. 02/2015 Echo: EF 25-30%, inf, apical AK, mid-apical-anteroseptal and ant HK, Gr1 DD, mild AI, mod dil LA.    ROS:   All systems reviewed and negative except as noted in the HPI.   Past Surgical History:  Procedure Laterality Date  . CORONARY ARTERY BYPASS GRAFT     4 vessel [employer].  Dr. Burns Spain 06/2000  . EP IMPLANTABLE DEVICE N/A 05/05/2016   Procedure: ICD Generator Changeout;  Surgeon: Evans Lance, MD;  Location: [facility];  Service: Cardiovascular;  Laterality: N/A;  . TOTAL KNEE ARTHROPLASTY Left   . TOTAL SHOULDER REPLACEMENT Bilateral      Family History  Problem Relation Age of Onset  . Hypertension Father   . Heart Problems Father        bypass 1992  . CAD Mother   . Pancreatic cancer Mother   . Heart Problems Maternal Grandfather      Social History   Social History  . Marital status: Married    Spouse name: N/A  . Number of children: 4  . Years of education: N/A   Occupational History  . Beef Cattle Farmer    Social History Main Topics  . Smoking status: Never Smoker  . Smokeless tobacco: Never Used  . Alcohol use No  .  Drug use: Unknown  . Sexual activity: Not on file   Other Topics Concern  . Not on file   Social History Narrative   Lives with wife and pony and draft horses.       BP 128/78 (BP Location: Right Arm)   Pulse 64   Ht 5\' 10"  (1.778 m)   Wt 216 lb (98 kg)   SpO2 97%   BMI 30.99 kg/m   Physical Exam:  Well appearing 71 yo man, NAD HEENT: Unremarkable Neck:  6 cm JVD, no thyromegally Lymphatics:  No adenopathy Back:  No CVA tenderness Lungs:  Clear with no wheezes HEART:  Regular rate rhythm, no murmurs, no rubs, no clicks Abd:  soft, positive bowel sounds, no organomegally, no rebound, no guarding Ext:  2 plus pulses, no edema, no cyanosis, no clubbing Skin:  No rashes no nodules Neuro:  CN II through XII  intact, motor grossly intact  EKG - NSR with marked first degree AV block and PVC's and intermittent LBBB  DEVICE  Normal device function.  See PaceArt for details.   Assess/Plan: 1. Chronic systolic heart failure - his symptoms despite his conduction disease remain class 1-2. I have discussed the possibility of progression of his symptoms and asked him to call if this occurs. He will continue his current meds. I would anticipate he hold his coreg if he develops symptomatic heart block 2. Conduction system disease - he has had some progression of his disease and appears to be having intermittant LBBB. He will undergo watchful waiting. 3. ICD - his medtronic device is working normally but appears to be over calling his atrial fib. I see no evidence of this. 4. CAD - he is s/p stenting. He denies anginal symptoms.   Mikle Bosworth.D.

## 2017-07-09 ENCOUNTER — Encounter: Payer: Self-pay | Admitting: Cardiology

## 2017-07-20 NOTE — Progress Notes (Signed)
Cardiology Office Note   barely been never been no Date:  07/22/2017   ID:  Stephen Adams, DOB [birth year], MRN [id number]  PCP:  Josetta Huddle, MD  Cardiologist:   Minus Breeding, MD    Chief Complaint  Patient presents with  . Cardiomyopathy      History of Present Illness: Stephen Adams is a 71 y.o. male who presents  for evaluation of ischemic cardiomyopathy and CAD.  The patient had a myocardial infarction several years ago at the time of left shoulder surgery. Because he was just post surgery he did not get angioplasty. He had a resultant ischemic cardiomyopathy. He did later get bypass and then an ICD.  He saw Dr. Lovena Le because he had an alarm on his ICD that  was falsely reporting atrial fib.    He does not feel any fibrillation. He does have a little dyspnea with exertion (class II). He denies any resting shortness of breath, PND or orthopnea. He's had no chest pressure, neck or arm discomfort. He's had no palpitations, presyncope or syncope. He said no weight gain or edema. He's no longer hearing the arm on his device. Come back and see the PAs 111none   Past Medical History:  Diagnosis Date  . CAD (coronary artery disease)    a. 1989 PTCA LAD; b. 1999 BMS to LAD & RCA; c. 2000 PTCA RCA 2/2 ISR; d. 10/1999 Inf MI/VF Arrest: 4 stents to RCA 2/2 ISR; e. 05/2001 Cath: ISR RCA, sev LAD dzs, EF 35-40%-->CABG (details unknown); f. 09/2013 MV: Inferior infarct, no ischemia.  . Chronic systolic CHF (congestive heart failure) ([place])    a. 02/2015 Echo: EF 25-30%.  . DJD (degenerative joint disease)   . Hyperlipidemia   . Hypertensive heart disease   . 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 10/2008 s/p MDT [employer] DR single lead AICD. Ser # [id number] H.  . Ischemic cardiomyopathy    a. 02/2015 Echo: EF 25-30%, inf, apical AK, mid-apical-anteroseptal and ant HK, Gr1 DD, mild AI, mod dil LA.    Past Surgical History:  Procedure Laterality Date  .  CORONARY ARTERY BYPASS GRAFT     4 vessel [employer].  Dr. Burns Spain 06/2000  . EP IMPLANTABLE DEVICE N/A 05/05/2016   Procedure: ICD Generator Changeout;  Surgeon: Evans Lance, MD;  Location: [facility];  Service: Cardiovascular;  Laterality: N/A;  . TOTAL KNEE ARTHROPLASTY Left   . TOTAL SHOULDER REPLACEMENT Bilateral      Current Outpatient Prescriptions  Medication Sig Dispense Refill  . aspirin 81 MG tablet Take 81 mg by mouth daily.    . carvedilol (COREG) 6.25 MG tablet TAKE 1 TABLET BY MOUTH TWICE A DAY 180 tablet 0  . Cholecalciferol (VITAMIN D-3 PO) Take 2 capsules by mouth daily.    Marland Kitchen lovastatin (MEVACOR) 20 MG tablet TAKE 1 TABLET (20 MG TOTAL) BY MOUTH AT BEDTIME. 90 tablet 0  . pantoprazole (PROTONIX) 40 MG tablet Take 1 tablet (40 mg total) by mouth daily. PLEASE CONTACT OFFICE FOR ADDITIONAL REFILLS 15 tablet 0  . sacubitril-valsartan (ENTRESTO) 49-51 MG Take 1 tablet by mouth 2 (two) times daily. 60 tablet 11   No current facility-administered medications for this visit.     Allergies:   Oxycodone-acetaminophen    ROS:  Please see the history of present illness.   Otherwise, review of systems are positive for none.   All other systems are reviewed and negative.    PHYSICAL  EXAM: VS:  BP 136/70   Pulse 61   Ht 5\' 10"  (1.778 m)   Wt 217 lb (98.4 kg)   SpO2 95%   BMI 31.14 kg/m  , BMI Body mass index is 31.14 kg/m. GENERAL:  Well appearing HEENT:  Pupils equal round and reactive, fundi not visualized, oral mucosa unremarkable NECK:  No jugular venous distention, waveform within normal limits, carotid upstroke brisk and symmetric, no bruits, no thyromegaly LYMPHATICS:  No cervical, inguinal adenopathy LUNGS:  Clear to auscultation bilaterally BACK:  No CVA tenderness CHEST:  Well healed ICD pocket HEART:  PMI not displaced or sustained,S1 and S2 within normal limits, no S3, no S4, no clicks, no rubs, no murmurs ABD:  Flat, positive bowel sounds  normal in frequency in pitch, no bruits, no rebound, no guarding, no midline pulsatile mass, no hepatomegaly, no splenomegaly EXT:  2 plus pulses throughout, no edema, no cyanosis no clubbing SKIN:  No rashes no nodules NEURO:  Cranial nerves II through XII grossly intact, motor grossly intact throughout PSYCH:  Cognitively intact, oriented to person place and time    EKG:  EKG is not ordered today.    Recent Labs: No results found for requested labs within last 8760 hours.    Lipid Panel    Wt Readings from Last 3 Encounters:  07/21/17 217 lb (98.4 kg)  07/08/17 216 lb (98 kg)  08/24/16 223 lb (101.2 kg)      Other studies Reviewed: Additional studies/ records that were reviewed today include: Device interrogation records. Review of the above records demonstrates:  Please see elsewhere in the note.     ASSESSMENT AND PLAN:     Ischemic cardiomyopathy He has an ischemic cardiomyopathy with class II symptoms. I'm going to stop his INRs are done. He's going to start [facility] and can come back for med titration in one month.    Hx of CABG The patient has no new sypmtoms.  No further cardiovascular testing is indicated.  We will continue with aggressive risk reduction and meds as listed.  ICD (implantable cardioverter-defibrillator)  I spoke with the device clinic and reviewed personally his interrogation. I reviewed Dr. Tanna Furry note. There is no evidence of atrial fibrillation. He has normal device function. There is no indication for a coagulation change in therapy other than above.   Hyperlipidemia LDL recently was 71 and he will remain on meds as listed.  I reviewed outside labs  AAA 3.1 X 3.1.  Follow up one year with ultrasound.   This was done in [place] and we will follow-up next year   Current medicines are reviewed at length with the patient today.  The patient does not have concerns regarding medicines.  The following changes have been made:  As  above  Labs/ tests ordered today include:  No orders of the defined types were placed in this encounter.    Disposition:   FU with APP one month.     Signed, Minus Breeding, MD  07/22/2017 8:55 PM    [place]

## 2017-07-21 ENCOUNTER — Encounter: Payer: Self-pay | Admitting: Cardiology

## 2017-07-21 ENCOUNTER — Ambulatory Visit (INDEPENDENT_AMBULATORY_CARE_PROVIDER_SITE_OTHER): Payer: Medicare Other | Admitting: Cardiology

## 2017-07-21 VITALS — BP 136/70 | HR 61 | Ht 70.0 in | Wt 217.0 lb

## 2017-07-21 DIAGNOSIS — I255 Ischemic cardiomyopathy: Secondary | ICD-10-CM | POA: Diagnosis not present

## 2017-07-21 DIAGNOSIS — I5022 Chronic systolic (congestive) heart failure: Secondary | ICD-10-CM

## 2017-07-21 DIAGNOSIS — Z4502 Encounter for adjustment and management of automatic implantable cardiac defibrillator: Secondary | ICD-10-CM | POA: Diagnosis not present

## 2017-07-21 DIAGNOSIS — E785 Hyperlipidemia, unspecified: Secondary | ICD-10-CM | POA: Diagnosis not present

## 2017-07-21 DIAGNOSIS — I2510 Atherosclerotic heart disease of native coronary artery without angina pectoris: Secondary | ICD-10-CM | POA: Diagnosis not present

## 2017-07-21 MED ORDER — SACUBITRIL-VALSARTAN 49-51 MG PO TABS: 1.0000 | ORAL_TABLET | Freq: Two times a day (BID) | ORAL | 11 refills | Status: DC

## 2017-07-21 NOTE — Patient Instructions (Addendum)
Medication Instructions:  STOP- Irbesartan/HCTz START- Entresto 49/51 mg twice a day  If you need a refill on your cardiac medications before your next appointment, please call your pharmacy.  Labwork: None Ordered   Testing/Procedures: None Ordered  Follow-Up: Your physician wants you to follow-up in: 1 Month with APP.    Thank you for choosing [facility]!!

## 2017-07-22 ENCOUNTER — Encounter: Payer: Self-pay | Admitting: Cardiology

## 2017-07-28 ENCOUNTER — Other Ambulatory Visit: Payer: Self-pay | Admitting: Cardiology

## 2017-08-09 DIAGNOSIS — Z23 Encounter for immunization: Secondary | ICD-10-CM | POA: Diagnosis not present

## 2017-08-09 DIAGNOSIS — L720 Epidermal cyst: Secondary | ICD-10-CM | POA: Diagnosis not present

## 2017-08-10 DIAGNOSIS — L720 Epidermal cyst: Secondary | ICD-10-CM | POA: Diagnosis not present

## 2017-08-20 ENCOUNTER — Ambulatory Visit (INDEPENDENT_AMBULATORY_CARE_PROVIDER_SITE_OTHER): Payer: Medicare Other | Admitting: Cardiology

## 2017-08-20 ENCOUNTER — Encounter: Payer: Self-pay | Admitting: Cardiology

## 2017-08-20 VITALS — BP 115/74 | HR 54 | Ht 70.0 in | Wt 223.4 lb

## 2017-08-20 DIAGNOSIS — Z79899 Other long term (current) drug therapy: Secondary | ICD-10-CM | POA: Diagnosis not present

## 2017-08-20 DIAGNOSIS — I255 Ischemic cardiomyopathy: Secondary | ICD-10-CM

## 2017-08-20 LAB — BASIC METABOLIC PANEL
BUN/Creatinine Ratio: 17 (ref 10–24)
BUN: 13 mg/dL (ref 8–27)
CO2: 22 mmol/L (ref 20–29)
Calcium: 8.9 mg/dL (ref 8.6–10.2)
Chloride: 100 mmol/L (ref 96–106)
Creatinine, Ser: 0.75 mg/dL — ABNORMAL LOW (ref 0.76–1.27)
GFR calc Af Amer: 107 mL/min/{1.73_m2} (ref >59)
GFR calc non Af Amer: 92 mL/min/{1.73_m2} (ref >59)
Glucose: 103 mg/dL — ABNORMAL HIGH (ref 65–99)
Potassium: 4.6 mmol/L (ref 3.5–5.2)
Sodium: 137 mmol/L (ref 134–144)

## 2017-08-20 MED ORDER — SACUBITRIL-VALSARTAN 97-103 MG PO TABS: 1.0000 | ORAL_TABLET | Freq: Two times a day (BID) | ORAL | 5 refills | Status: DC

## 2017-08-20 NOTE — Assessment & Plan Note (Signed)
10/2008 s/p MDT [employer] DR single lead AICD. Ser # [id number] H. Changed July 2017

## 2017-08-20 NOTE — Assessment & Plan Note (Signed)
02/2016 Echo: EF 25-30%. No CHF symptoms, walks and exercises at the [facility]

## 2017-08-20 NOTE — Assessment & Plan Note (Signed)
CABG x 4 2002 (several prior PCI's) in [place]. Myoview Nov 2014- scar without ischemia

## 2017-08-20 NOTE — Patient Instructions (Signed)
Medication Instructions:  INCREASE ENTRESTO 97/103MG  TWICE DAILY If you need a refill on your cardiac medications before your next appointment, please call your pharmacy.  Labwork: BMET TODAY HERE IN OUR OFFICE AT [facility]  Special Instructions: COME BACK IN ONE WEEK FOR BLOOD PRESSURE CHECK WITH NURSE  Follow-Up: Your physician wants you to follow-up in: [place] should receive a reminder letter in the mail two months in advance. If you do not receive a letter, please call our office MARCH 2019 to schedule the MAY [id number] follow-up appointment.   Thank you for choosing [facility]!!

## 2017-08-20 NOTE — Progress Notes (Signed)
08/20/2017 Stephen Adams   05/07/1946  [id number]  Primary Physician Josetta Huddle, MD Primary Cardiologist: Dr Percival Spanish  HPI:  71 y/o male followed by Dr Percival Spanish and Dr Lovena Le with a history of CAD, s/p multiple PCIs going back to the 80's and ultimately CABG in 2002. Myoview in 2014 was low risk. He has an ICM and has an ICD in place. This was changed out in July 2017. Recently [place] in [place] when his ICD alarm went off. He was found to have PVCs. Dr Lovena Le saw him in Sept and adjusted his device and he has had no further problems. He is here to day to have his Entresto adjusted. He denies any weakness, orthostatic symptoms. Or unusual dyspnea.    Current Outpatient Medications  Medication Sig Dispense Refill  . aspirin 81 MG tablet Take 81 mg by mouth daily.    . carvedilol (COREG) 6.25 MG tablet TAKE 1 TABLET BY MOUTH TWICE A DAY 180 tablet 0  . lovastatin (MEVACOR) 20 MG tablet TAKE 1 TABLET (20 MG TOTAL) BY MOUTH AT BEDTIME. 90 tablet 0  . pantoprazole (PROTONIX) 40 MG tablet Take 1 tablet (40 mg total) by mouth daily. 90 tablet 3   No current facility-administered medications for this visit.     Allergies  Allergen Reactions  . Oxycodone-Acetaminophen Nausea Only    Past Medical History:  Diagnosis Date  . CAD (coronary artery disease)    a. 1989 PTCA LAD; b. 1999 BMS to LAD & RCA; c. 2000 PTCA RCA 2/2 ISR; d. 10/1999 Inf MI/VF Arrest: 4 stents to RCA 2/2 ISR; e. 05/2001 Cath: ISR RCA, sev LAD dzs, EF 35-40%-->CABG (details unknown); f. 09/2013 MV: Inferior infarct, no ischemia.  . Chronic systolic CHF (congestive heart failure) ([place])    a. 02/2015 Echo: EF 25-30%.  . DJD (degenerative joint disease)   . Hyperlipidemia   . Hypertensive heart disease   . 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 10/2008 s/p MDT [employer] DR single lead AICD. Ser # [id number] H.  . Ischemic cardiomyopathy    a. 02/2015 Echo: EF 25-30%, inf,  apical AK, mid-apical-anteroseptal and ant HK, Gr1 DD, mild AI, mod dil LA.    Social History   Socioeconomic History  . Marital status: Married    Spouse name: Not on file  . Number of children: 4  . Years of education: Not on file  . Highest education level: Not on file  Social Needs  . Financial resource strain: Not on file  . Food insecurity - worry: Not on file  . Food insecurity - inability: Not on file  . Transportation needs - medical: Not on file  . Transportation needs - non-medical: Not on file  Occupational History  . Occupation: Nurse, children's  Tobacco Use  . Smoking status: Never Smoker  . Smokeless tobacco: Never Used  Substance and Sexual Activity  . Alcohol use: No    Alcohol/week: 0.0 oz  . Drug use: No  . Sexual activity: Not on file  Other Topics Concern  . 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       Family History  Problem Relation Age of Onset  . Hypertension Father   . Heart Problems Father        bypass 1992  . CAD Mother   . Pancreatic cancer Mother   . Heart Problems Maternal  Grandfather      Review of Systems: General: negative for chills, fever, night sweats or weight changes.  Cardiovascular: negative for chest pain, dyspnea on exertion, edema, orthopnea, palpitations, paroxysmal nocturnal dyspnea or shortness of breath Dermatological: negative for rash Respiratory: negative for cough or wheezing Urologic: negative for hematuria Abdominal: negative for nausea, vomiting, diarrhea, bright red blood per rectum, melena, or hematemesis Neurologic: negative for visual changes, syncope, or dizziness All other systems reviewed and are otherwise negative except as noted above.    Blood pressure 115/74, pulse (!) 54, height 5\' 10"  (1.778 m), weight 223 lb 6.4 oz (101.3 kg).  General appearance: alert, cooperative and no distress Neck: no carotid bruit and no JVD Lungs: clear to auscultation  bilaterally Heart: regular rate and rhythm Extremities: extremities normal, atraumatic, no cyanosis or edema Skin: Skin color, texture, turgor normal. No rashes or lesions Neurologic: Grossly normal   ASSESSMENT AND PLAN:   Chronic systolic CHF (congestive heart failure) ([place])  02/2016 Echo: EF 25-30%. No CHF symptoms, walks and exercises at the [facility]  Hx of CABG CABG x 4 2002 (several prior PCI's) in [place]. 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 Ser # [id number] H. Changed July 2017   PLAN  Increase Entresto to 97/103. F/U B/P and BMP in a week.   Kerin Ransom PA-C 08/20/2017 10:41 AM

## 2017-08-24 ENCOUNTER — Ambulatory Visit (INDEPENDENT_AMBULATORY_CARE_PROVIDER_SITE_OTHER): Payer: Medicare Other | Admitting: *Deleted

## 2017-08-24 DIAGNOSIS — I255 Ischemic cardiomyopathy: Secondary | ICD-10-CM

## 2017-08-24 DIAGNOSIS — I5022 Chronic systolic (congestive) heart failure: Secondary | ICD-10-CM

## 2017-08-24 NOTE — Progress Notes (Signed)
Remote ICD transmission.   

## 2017-08-25 DIAGNOSIS — C4339 Malignant melanoma of other parts of face: Secondary | ICD-10-CM | POA: Diagnosis not present

## 2017-08-25 DIAGNOSIS — D485 Neoplasm of uncertain behavior of skin: Secondary | ICD-10-CM | POA: Diagnosis not present

## 2017-08-25 DIAGNOSIS — D225 Melanocytic nevi of trunk: Secondary | ICD-10-CM | POA: Diagnosis not present

## 2017-08-25 DIAGNOSIS — L281 Prurigo nodularis: Secondary | ICD-10-CM | POA: Diagnosis not present

## 2017-08-25 DIAGNOSIS — Z1283 Encounter for screening for malignant neoplasm of skin: Secondary | ICD-10-CM | POA: Diagnosis not present

## 2017-08-25 DIAGNOSIS — L570 Actinic keratosis: Secondary | ICD-10-CM | POA: Diagnosis not present

## 2017-08-25 DIAGNOSIS — X32XXXA Exposure to sunlight, initial encounter: Secondary | ICD-10-CM | POA: Diagnosis not present

## 2017-08-25 DIAGNOSIS — C434 Malignant melanoma of scalp and neck: Secondary | ICD-10-CM | POA: Diagnosis not present

## 2017-08-25 LAB — CUP PACEART REMOTE DEVICE CHECK
Battery Remaining Longevity: 128 mo
Battery Voltage: 3.03 V
Date Time Interrogation Session: 20181113051601
HighPow Impedance: 41 Ohm
HighPow Impedance: 52 Ohm
Implantable Lead Location: 753860
Implantable Lead Model: 6947
Implantable Pulse Generator Implant Date: 20170725
Lead Channel Impedance Value: 418 Ohm
Lead Channel Pacing Threshold Pulse Width: 0.4 ms
Lead Channel Setting Pacing Amplitude: 2.75 V
Lead Channel Setting Pacing Pulse Width: 0.4 ms
Lead Channel Setting Sensing Sensitivity: 0.3 mV
MDC IDC LEAD IMPLANT DT: 20100104
MDC IDC MSMT LEADCHNL RV IMPEDANCE VALUE: 342 Ohm
MDC IDC MSMT LEADCHNL RV PACING THRESHOLD AMPLITUDE: 1.375 V
MDC IDC MSMT LEADCHNL RV SENSING INTR AMPL: 13.125 mV
MDC IDC MSMT LEADCHNL RV SENSING INTR AMPL: 13.125 mV
MDC IDC STAT BRADY RV PERCENT PACED: 24.54 %

## 2017-08-27 ENCOUNTER — Encounter: Payer: Self-pay | Admitting: Cardiology

## 2017-08-27 ENCOUNTER — Ambulatory Visit (INDEPENDENT_AMBULATORY_CARE_PROVIDER_SITE_OTHER): Payer: Medicare Other

## 2017-08-27 VITALS — BP 132/76 | HR 44

## 2017-08-27 DIAGNOSIS — Z79899 Other long term (current) drug therapy: Secondary | ICD-10-CM | POA: Diagnosis not present

## 2017-08-27 DIAGNOSIS — I2510 Atherosclerotic heart disease of native coronary artery without angina pectoris: Secondary | ICD-10-CM

## 2017-08-27 NOTE — Progress Notes (Signed)
1.) Reason for visit: pt here for follow up BP check  2.) Name of MD requesting visit: luke Kilroy  3.) Assessment and plan per MD: reviewed BP and chart with Kerin Ransom, PA-C he states to have pt have BMET lab today.

## 2017-08-28 LAB — BASIC METABOLIC PANEL
BUN/Creatinine Ratio: 15 (ref 10–24)
BUN: 11 mg/dL (ref 8–27)
CALCIUM: 9.3 mg/dL (ref 8.6–10.2)
CO2: 21 mmol/L (ref 20–29)
CREATININE: 0.75 mg/dL — AB (ref 0.76–1.27)
Chloride: 102 mmol/L (ref 96–106)
GFR calc Af Amer: 107 mL/min/{1.73_m2} (ref >59)
GFR, EST NON AFRICAN AMERICAN: 92 mL/min/{1.73_m2} (ref >59)
Glucose: 102 mg/dL — ABNORMAL HIGH (ref 65–99)
Potassium: 4.3 mmol/L (ref 3.5–5.2)
Sodium: 137 mmol/L (ref 134–144)

## 2017-09-06 IMAGING — CT CT HEAD W/O CM
2 of 5 series · 15 of 47 positions shown, 18 images · non-contrast
Comparison: None.

CLINICAL DATA: S/p fall today fell in shower did not hit head per
pt. No loss consciousness

EXAM:
CT HEAD WITHOUT CONTRAST
TECHNIQUE: Contiguous axial images were obtained from the base of the skull
through the vertex without intravenous contrast.

[Series 2: head 5.0 h30s · axial · 0.47mm/px · z∈[-225,-75]mm · 12 of 34 slices shown, 15 images]
[im 2/34  brain]
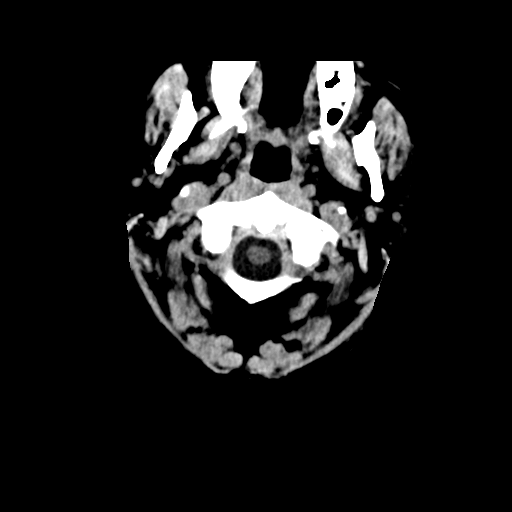
[im 2/34  bone]
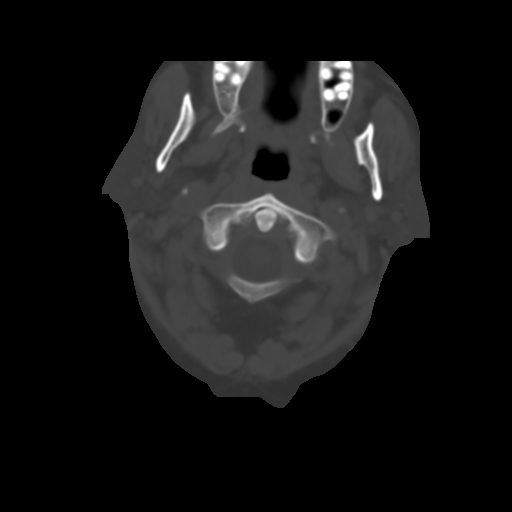
[im 5/34  brain]
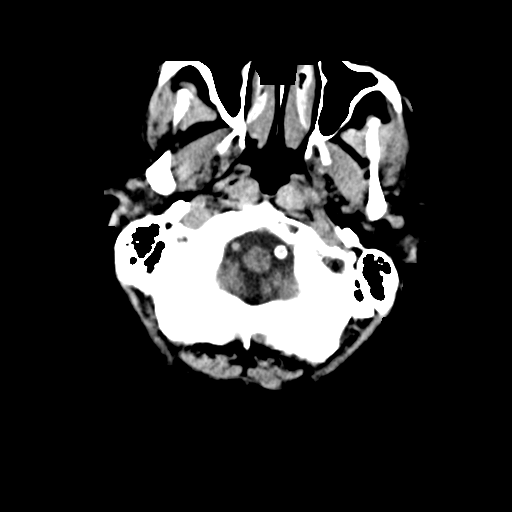
[im 8/34  brain]
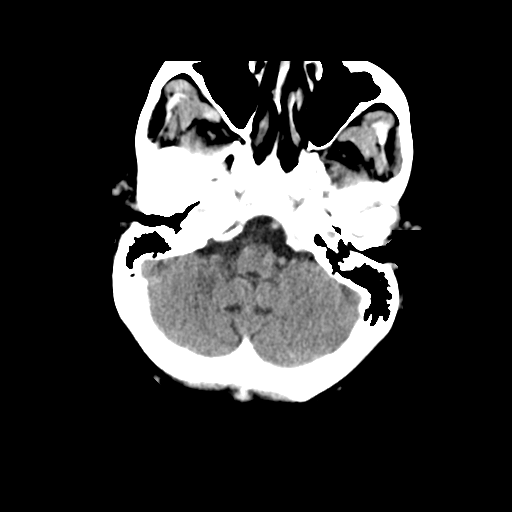
[im 11/34  brain]
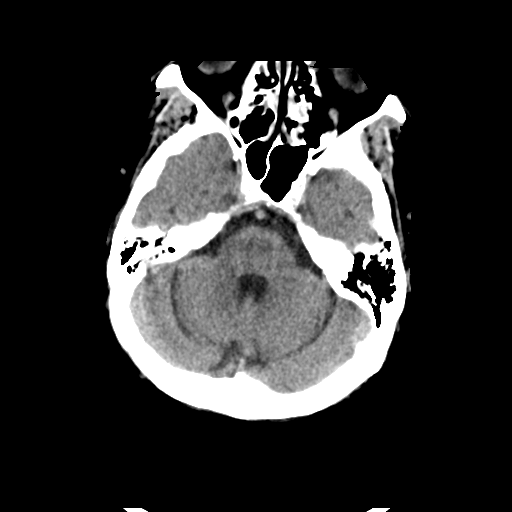
[im 13/34  brain]
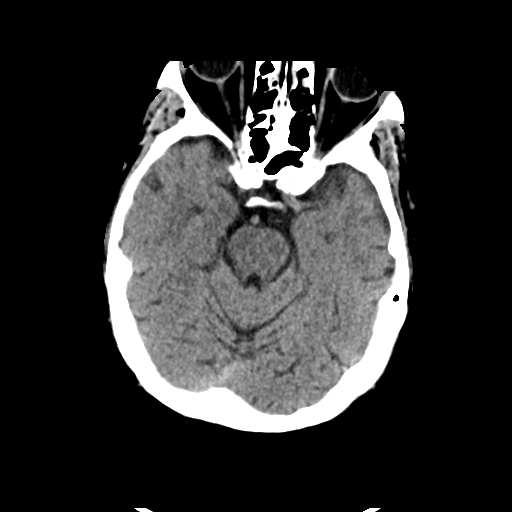
[im 13/34  bone]
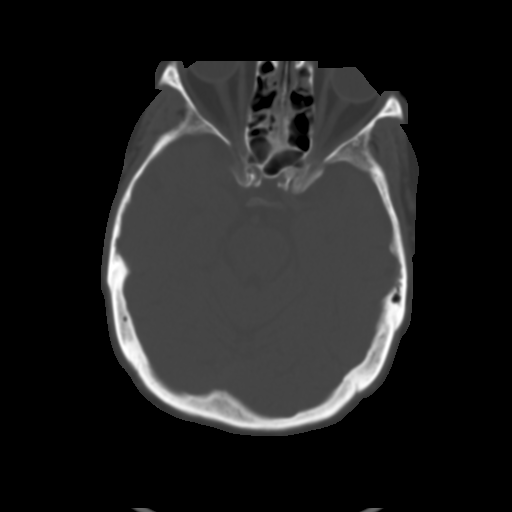
[im 16/34  brain]
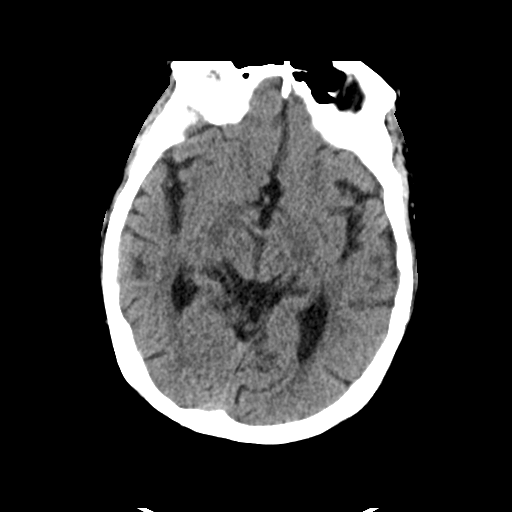
[im 18/34  brain]
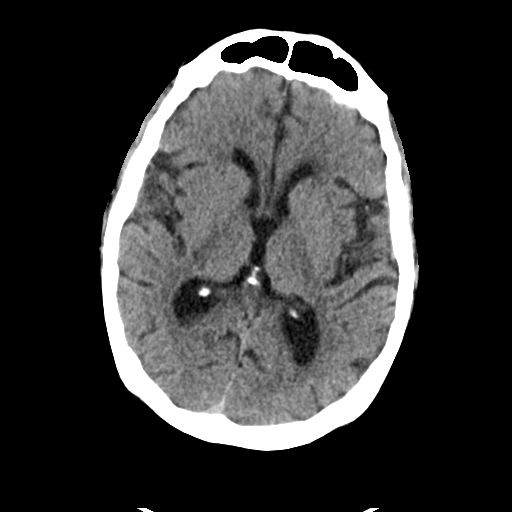
[im 21/34  brain]
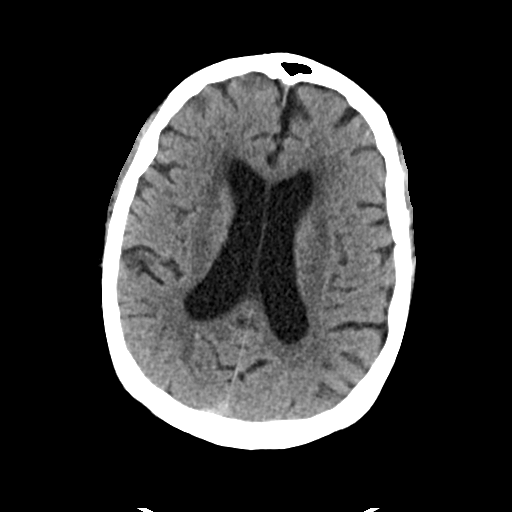
[im 23/34  brain]
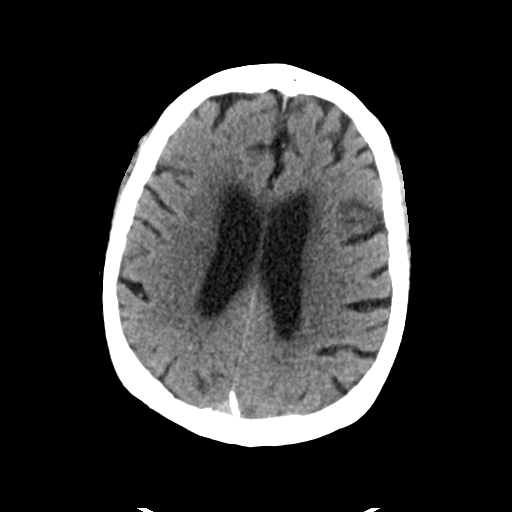
[im 23/34  bone]
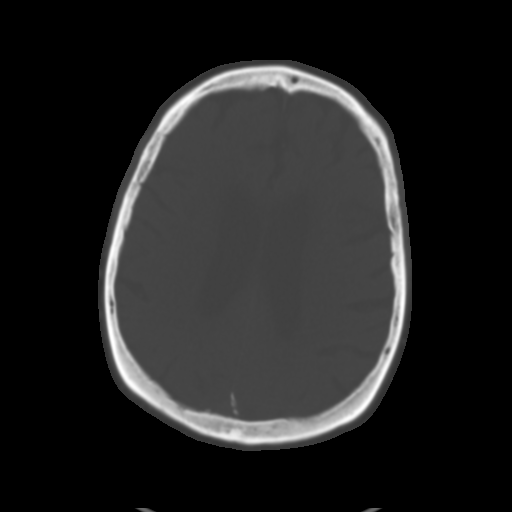
[im 26/34  brain]
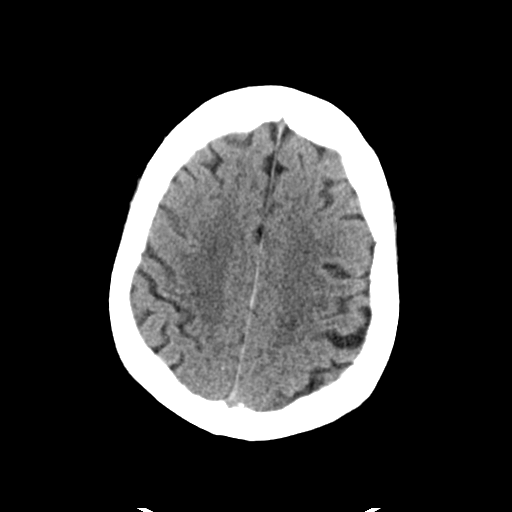
[im 29/34  brain]
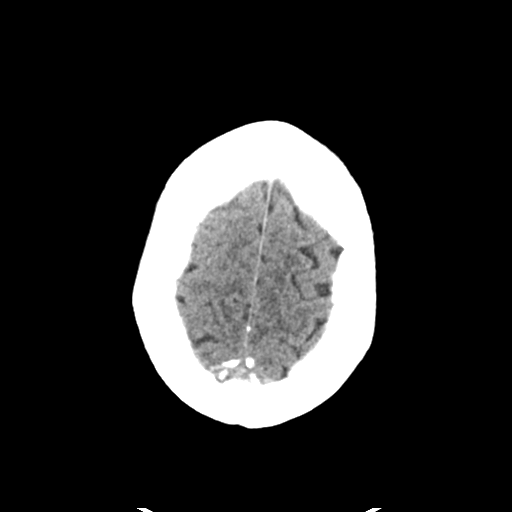
[im 32/34  brain]
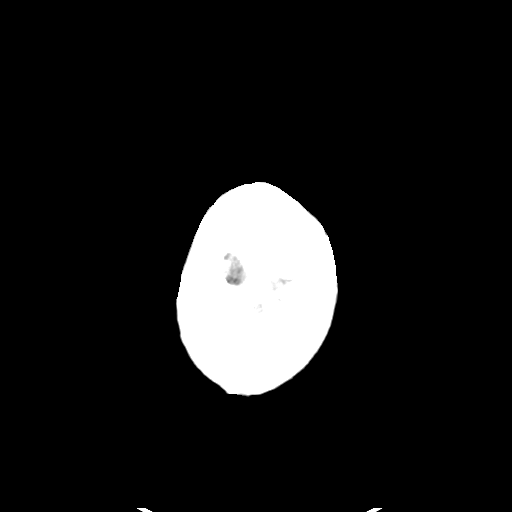

[Series 6: head 3.0 mpr · coronal · 0.32mm/px · 3 of 73 slices shown]
[im 25/73  brain]
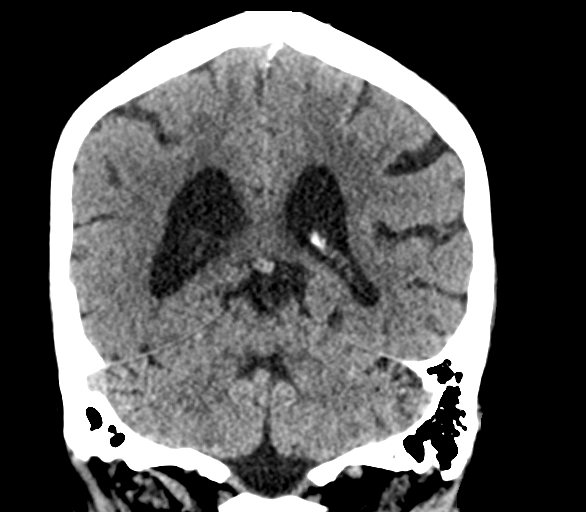
[im 37/73  brain]
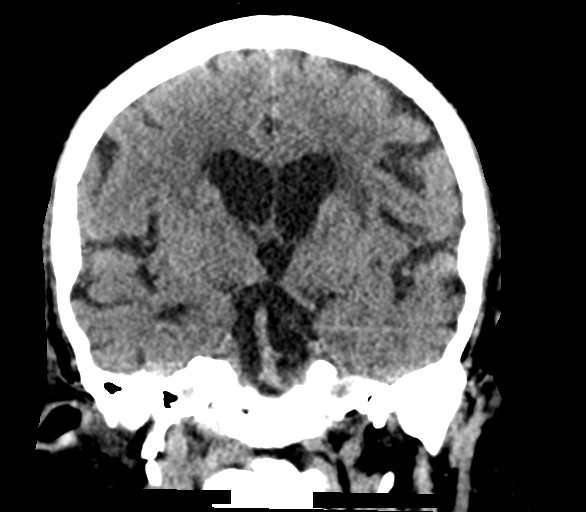
[im 49/73  brain]
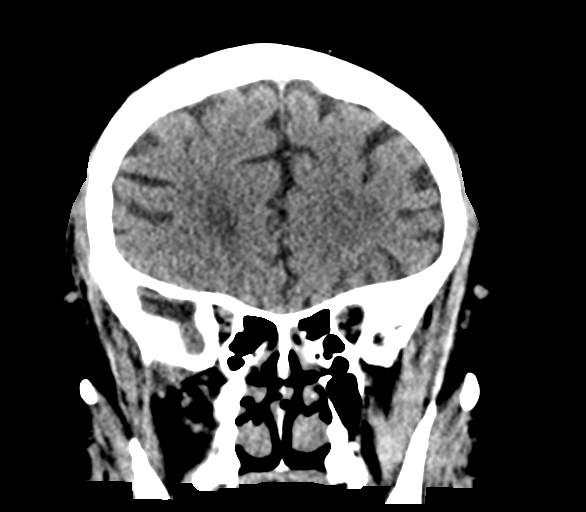

[15 of 47 positions shown; findings below may reference images not displayed]

FINDINGS: Moderate diffuse age-related atrophy. Moderate low attenuation
throughout the periventricular white matter. No focal attenuation
change to suggest mass or vascular territory infarct. No hemorrhage
or extra-axial fluid. No hydrocephalus. No skull fracture.
IMPRESSION: Age-related involutional change with no acute findings

## 2017-09-16 DIAGNOSIS — L989 Disorder of the skin and subcutaneous tissue, unspecified: Secondary | ICD-10-CM | POA: Diagnosis not present

## 2017-09-16 DIAGNOSIS — C4339 Malignant melanoma of other parts of face: Secondary | ICD-10-CM | POA: Diagnosis not present

## 2017-09-16 DIAGNOSIS — L905 Scar conditions and fibrosis of skin: Secondary | ICD-10-CM | POA: Diagnosis not present

## 2017-11-23 ENCOUNTER — Other Ambulatory Visit: Payer: Self-pay

## 2017-11-23 ENCOUNTER — Telehealth: Payer: Self-pay | Admitting: Cardiology

## 2017-11-23 ENCOUNTER — Ambulatory Visit (INDEPENDENT_AMBULATORY_CARE_PROVIDER_SITE_OTHER): Payer: Medicare Other | Admitting: *Deleted

## 2017-11-23 ENCOUNTER — Encounter (HOSPITAL_COMMUNITY): Payer: Self-pay | Admitting: Emergency Medicine

## 2017-11-23 ENCOUNTER — Emergency Department (HOSPITAL_COMMUNITY)
Admission: EM | Admit: 2017-11-23 | Discharge: 2017-11-24 | Disposition: A | Discharge status: Home / Self Care | Payer: Medicare Other | Attending: Emergency Medicine | Admitting: Emergency Medicine

## 2017-11-23 DIAGNOSIS — Z951 Presence of aortocoronary bypass graft: Secondary | ICD-10-CM | POA: Diagnosis not present

## 2017-11-23 DIAGNOSIS — I5022 Chronic systolic (congestive) heart failure: Secondary | ICD-10-CM

## 2017-11-23 DIAGNOSIS — Z79899 Other long term (current) drug therapy: Secondary | ICD-10-CM | POA: Diagnosis not present

## 2017-11-23 DIAGNOSIS — I110 Hypertensive heart disease with heart failure: Secondary | ICD-10-CM | POA: Diagnosis not present

## 2017-11-23 DIAGNOSIS — I255 Ischemic cardiomyopathy: Secondary | ICD-10-CM | POA: Diagnosis not present

## 2017-11-23 DIAGNOSIS — M7989 Other specified soft tissue disorders: Secondary | ICD-10-CM

## 2017-11-23 DIAGNOSIS — M79661 Pain in right lower leg: Secondary | ICD-10-CM

## 2017-11-23 DIAGNOSIS — Z7982 Long term (current) use of aspirin: Secondary | ICD-10-CM | POA: Insufficient documentation

## 2017-11-23 DIAGNOSIS — R52 Pain, unspecified: Secondary | ICD-10-CM

## 2017-11-23 DIAGNOSIS — Z95810 Presence of automatic (implantable) cardiac defibrillator: Secondary | ICD-10-CM | POA: Insufficient documentation

## 2017-11-23 DIAGNOSIS — I2510 Atherosclerotic heart disease of native coronary artery without angina pectoris: Secondary | ICD-10-CM | POA: Diagnosis not present

## 2017-11-23 MED ORDER — RIVAROXABAN 15 MG PO TABS
15.0000 mg | ORAL_TABLET | Freq: Once | ORAL | Status: AC
  Administered 2017-11-24: 15 mg via ORAL
  Filled 2017-11-23 (×2): qty 1

## 2017-11-23 NOTE — Progress Notes (Signed)
Remote ICD transmission.   

## 2017-11-23 NOTE — Telephone Encounter (Signed)
New Message     Pt c/o swelling: STAT is pt has developed SOB within 24 hours  1) How much weight have you gained and in what time span? -7.5lbs since the weekend  2) If swelling, where is the swelling located?  Ankles and right calf is very painful  3) Are you currently taking a fluid pill? no  4) Are you currently SOB?  no  5) Do you have a log of your daily weights (if so, list)? no  6) Have you gained 3 pounds in a day or 5 pounds in a week? no  7) Have you traveled recently? no

## 2017-11-23 NOTE — Telephone Encounter (Signed)
Spoke with pt, he has swelling and soreness in his right calf area. It is sore to the touch and warm. He worked out at [employer] and it did not really bother him. He is concerned about a clot. Will forward to dr hochrein to advise

## 2017-11-23 NOTE — ED Provider Notes (Signed)
[facility] EMERGENCY DEPARTMENT Provider Note   CSN: [id number] Arrival date & time: 11/23/17  1805     History   Chief Complaint Chief Complaint  Patient presents with  . Leg Pain    HPI Stephen Adams is a 72 y.o. male.  The history is provided by the patient.  He has history of ischemic cardiomyopathy with chronic systolic heart failure, coronary artery disease, hyperlipidemia, hypertension status post placement of implanted defibrillator and comes in with pain and swelling of his right lower leg which started yesterday and got worse today.  He denies any trauma.  He has not had any recent surgery nor has he had any recent travel.  Pain gets as severe as 5/10.  It is worse when he is up and walking.  It is better with rest.  He called his cardiologist who recommended that he come in to be evaluated for DVT.   Past Medical History:  Diagnosis Date  . CAD (coronary artery disease)    a. 1989 PTCA LAD; b. 1999 BMS to LAD & RCA; c. 2000 PTCA RCA 2/2 ISR; d. 10/1999 Inf MI/VF Arrest: 4 stents to RCA 2/2 ISR; e. 05/2001 Cath: ISR RCA, sev LAD dzs, EF 35-40%-->CABG (details unknown); f. 09/2013 MV: Inferior infarct, no ischemia.  . Chronic systolic CHF (congestive heart failure) ([place])    a. 02/2015 Echo: EF 25-30%.  . DJD (degenerative joint disease)   . Hyperlipidemia   . Hypertensive heart disease   . 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 10/2008 s/p MDT [employer] single lead AICD. Ser # [id number] H.  . Ischemic cardiomyopathy    a. 02/2015 Echo: EF 25-30%, inf, apical AK, mid-apical-anteroseptal and ant HK, Gr1 DD, mild AI, mod dil LA.    Patient Active Problem List   Diagnosis Date Noted  . Ventricular tachycardia ([place]) 05/05/2016  . Injury of foot, left 03/23/2016  . Hyponatremia   . Syncope 03/02/2016  . Chronic systolic CHF (congestive heart failure) ([place])   . ICD (implantable cardioverter-defibrillator) battery depletion   . Ischemic  cardiomyopathy   . Hyperlipidemia   . Hypertensive heart disease   . ICD (implantable cardioverter-defibrillator) in place 05/01/2015  . Cardiomyopathy, ischemic 02/28/2015  . CAD (coronary artery disease) 02/28/2015  . 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 CORONARY ARTERY BYPASS GRAFT     4 vessel [employer].  Dr. Burns Spain 06/2000  . EP IMPLANTABLE DEVICE N/A 05/05/2016   Procedure: ICD Generator Changeout;  Surgeon: Evans Lance, MD;  Location: [place] [facility];  Service: Cardiovascular;  Laterality: N/A;  . TOTAL KNEE ARTHROPLASTY Left   . TOTAL SHOULDER REPLACEMENT Bilateral        Home Medications    Prior to Admission medications   Medication Sig Start Date End Date Taking? Authorizing Provider  aspirin 81 MG tablet Take 81 mg by mouth daily.    [provider]  carvedilol (COREG) 6.25 MG tablet TAKE 1 TABLET BY MOUTH TWICE A DAY 06/03/17   Minus Breeding, MD  lovastatin (MEVACOR) 20 MG tablet TAKE 1 TABLET (20 MG TOTAL) BY MOUTH AT BEDTIME. 06/03/17   Minus Breeding, MD  pantoprazole (PROTONIX) 40 MG tablet Take 1 tablet (40 mg total) by mouth daily. 07/28/17   Minus Breeding, MD  sacubitril-valsartan (ENTRESTO) 97-103 MG Take 1 tablet 2 (two) times daily by mouth. 08/20/17   Erlene Quan, PA-C    Family History  Family History  Problem Relation Age of Onset  . Hypertension Father   . Heart Problems Father        bypass 1992  . CAD Mother   . Pancreatic cancer Mother   . Heart Problems Maternal Grandfather     Social History Social History   Tobacco Use  . Smoking status: Never Smoker  . Smokeless tobacco: Never Used  Substance Use Topics  . Alcohol use: Yes    Alcohol/week: 0.0 oz    Comment: occas  . Drug use: No     Allergies   Oxycodone-acetaminophen   Review of Systems Review of Systems  All other systems reviewed and are negative.    Physical Exam Updated Vital Signs BP (!) 155/87    Pulse (!) 58   Temp 97.7 F (36.5 C) (Oral)   Resp 17   Ht 5' 10.5" (1.791 m)   Wt 99.3 kg (219 lb)   SpO2 96%   BMI 30.98 kg/m   Physical Exam  Nursing note and vitals reviewed.  72 year old male, resting comfortably and in no acute distress. Vital signs are significant for elevated systolic blood pressure. Oxygen saturation is 96%, which is normal. Head is normocephalic and atraumatic. PERRLA, EOMI. Oropharynx is clear. Neck is nontender and supple without adenopathy or JVD. Back is nontender and there is no CVA tenderness. Lungs are clear without rales, wheezes, or rhonchi. Chest is nontender. Heart has regular rate and rhythm without murmur. Abdomen is soft, flat, nontender without masses or hepatosplenomegaly and peristalsis is normoactive. Extremities: Right foot has 2+ pedal edema.  Right calf circumference is 2 cm greater than left calf circumference.  There is no tenderness to palpation and no palpable cords.  Homans sign is negative.  There is no tenderness in the thigh or groin and there is no difference in thigh circumference.  Capillary refill is prompt and dorsalis pedis pulses strong. Skin is warm and dry without rash. Neurologic: Mental status is normal, cranial nerves are intact, there are no motor or sensory deficits.  ED Treatments / Results   Procedures Procedures (including critical care time)  Medications Ordered in ED Medications  Rivaroxaban (XARELTO) tablet 15 mg (not administered)     Initial Impression / Assessment and Plan / ED Course  I have reviewed the triage vital signs and the nursing notes.  Pain and swelling of the right lower leg concerning for DVT.  Old records are reviewed confirming outpatient management for ischemic cardiomyopathy and implanted defibrillator.  He is given a dose of rivaroxaban and will be brought back tomorrow for venous ultrasound.  Final Clinical Impressions(s) / ED Diagnoses   Final diagnoses:  Pain and swelling  of right lower leg    ED Discharge Orders    None       Delora Fuel, MD 37/04/88 2316

## 2017-11-23 NOTE — ED Triage Notes (Signed)
Patient sent by his Cardiologist for eval of possible DVT. R leg swollen and painful.

## 2017-11-23 NOTE — Telephone Encounter (Signed)
Spoke with pt, aware of dr hochrein's recommendations. ?

## 2017-11-23 NOTE — Telephone Encounter (Signed)
I would suggest that he go to an Urgent Care or ER tonight to evaluate this.

## 2017-11-23 NOTE — Discharge Instructions (Signed)
Return tomorrow for ultrasound tomorrow. If there is a blood clot, they will send you here to get a prescription for the blood thinner.

## 2017-11-24 ENCOUNTER — Encounter: Payer: Self-pay | Admitting: Cardiology

## 2017-11-24 ENCOUNTER — Ambulatory Visit (HOSPITAL_COMMUNITY)
Admission: RE | Admit: 2017-11-24 | Discharge: 2017-11-24 | Disposition: A | Discharge status: Home / Self Care | Payer: Medicare Other | Source: Ambulatory Visit | Attending: Emergency Medicine | Admitting: Emergency Medicine

## 2017-11-24 DIAGNOSIS — M7989 Other specified soft tissue disorders: Secondary | ICD-10-CM | POA: Insufficient documentation

## 2017-11-24 DIAGNOSIS — M79661 Pain in right lower leg: Secondary | ICD-10-CM | POA: Insufficient documentation

## 2017-11-24 DIAGNOSIS — R52 Pain, unspecified: Secondary | ICD-10-CM

## 2017-11-24 DIAGNOSIS — R600 Localized edema: Secondary | ICD-10-CM | POA: Diagnosis not present

## 2017-11-24 NOTE — ED Provider Notes (Signed)
Invasive venous study of leg negative for DVT..  I walked out to the waiting room to give patient result he was not there.  I gave results to the charge nurse who will attempt to contact patient at home and ensure follow-up if needed   Orlie Dakin, MD 11/24/17 1557

## 2017-11-24 NOTE — ED Notes (Signed)
EDP not able to locate pt to review [place] results. Attempted to contact pt at home and cell phone. Left voicemail on cell phone. Unit Secretary reported pt is in radiology. This RN spoke with pt and reviewed negative result per EDP verbal order. Pt verbalized understanding.

## 2017-11-25 ENCOUNTER — Other Ambulatory Visit: Payer: Self-pay | Admitting: Cardiology

## 2017-11-25 NOTE — Telephone Encounter (Signed)
REFILL 

## 2017-12-03 LAB — CUP PACEART REMOTE DEVICE CHECK
Battery Voltage: 3.01 V
Brady Statistic RV Percent Paced: 32.6 %
Date Time Interrogation Session: 20190212052203
HIGH POWER IMPEDANCE MEASURED VALUE: 42 Ohm
HighPow Impedance: 52 Ohm
Implantable Lead Implant Date: 20100104
Implantable Lead Location: 753860
Lead Channel Impedance Value: 304 Ohm
Lead Channel Pacing Threshold Amplitude: 1.5 V
Lead Channel Sensing Intrinsic Amplitude: 13.75 mV
Lead Channel Setting Sensing Sensitivity: 0.3 mV
MDC IDC MSMT BATTERY REMAINING LONGEVITY: 125 mo
MDC IDC MSMT LEADCHNL RV IMPEDANCE VALUE: 399 Ohm
MDC IDC MSMT LEADCHNL RV PACING THRESHOLD PULSEWIDTH: 0.4 ms
MDC IDC MSMT LEADCHNL RV SENSING INTR AMPL: 13.75 mV
MDC IDC PG IMPLANT DT: 20170725
MDC IDC SET LEADCHNL RV PACING AMPLITUDE: 3 V
MDC IDC SET LEADCHNL RV PACING PULSEWIDTH: 0.4 ms

## 2017-12-07 DIAGNOSIS — D225 Melanocytic nevi of trunk: Secondary | ICD-10-CM | POA: Diagnosis not present

## 2017-12-07 DIAGNOSIS — Z85820 Personal history of malignant melanoma of skin: Secondary | ICD-10-CM | POA: Diagnosis not present

## 2017-12-07 DIAGNOSIS — Z1283 Encounter for screening for malignant neoplasm of skin: Secondary | ICD-10-CM | POA: Diagnosis not present

## 2017-12-07 DIAGNOSIS — D485 Neoplasm of uncertain behavior of skin: Secondary | ICD-10-CM | POA: Diagnosis not present

## 2017-12-07 DIAGNOSIS — Z08 Encounter for follow-up examination after completed treatment for malignant neoplasm: Secondary | ICD-10-CM | POA: Diagnosis not present

## 2017-12-07 DIAGNOSIS — L814 Other melanin hyperpigmentation: Secondary | ICD-10-CM | POA: Diagnosis not present

## 2018-01-12 DIAGNOSIS — I10 Essential (primary) hypertension: Secondary | ICD-10-CM | POA: Diagnosis not present

## 2018-01-12 DIAGNOSIS — Z125 Encounter for screening for malignant neoplasm of prostate: Secondary | ICD-10-CM | POA: Diagnosis not present

## 2018-01-12 DIAGNOSIS — Z23 Encounter for immunization: Secondary | ICD-10-CM | POA: Diagnosis not present

## 2018-01-12 DIAGNOSIS — Z0000 Encounter for general adult medical examination without abnormal findings: Secondary | ICD-10-CM | POA: Diagnosis not present

## 2018-01-12 DIAGNOSIS — C439 Malignant melanoma of skin, unspecified: Secondary | ICD-10-CM | POA: Diagnosis not present

## 2018-01-12 DIAGNOSIS — E559 Vitamin D deficiency, unspecified: Secondary | ICD-10-CM | POA: Diagnosis not present

## 2018-01-12 DIAGNOSIS — I2510 Atherosclerotic heart disease of native coronary artery without angina pectoris: Secondary | ICD-10-CM | POA: Diagnosis not present

## 2018-01-12 DIAGNOSIS — R319 Hematuria, unspecified: Secondary | ICD-10-CM | POA: Diagnosis not present

## 2018-01-12 DIAGNOSIS — Z95810 Presence of automatic (implantable) cardiac defibrillator: Secondary | ICD-10-CM | POA: Diagnosis not present

## 2018-01-12 DIAGNOSIS — Z79899 Other long term (current) drug therapy: Secondary | ICD-10-CM | POA: Diagnosis not present

## 2018-01-12 DIAGNOSIS — E785 Hyperlipidemia, unspecified: Secondary | ICD-10-CM | POA: Diagnosis not present

## 2018-01-12 DIAGNOSIS — Z1389 Encounter for screening for other disorder: Secondary | ICD-10-CM | POA: Diagnosis not present

## 2018-01-12 DIAGNOSIS — I714 Abdominal aortic aneurysm, without rupture: Secondary | ICD-10-CM | POA: Diagnosis not present

## 2018-01-13 ENCOUNTER — Other Ambulatory Visit: Payer: Self-pay

## 2018-01-13 DIAGNOSIS — I714 Abdominal aortic aneurysm, without rupture, unspecified: Secondary | ICD-10-CM

## 2018-01-28 ENCOUNTER — Ambulatory Visit (INDEPENDENT_AMBULATORY_CARE_PROVIDER_SITE_OTHER): Payer: Medicare Other

## 2018-01-28 ENCOUNTER — Other Ambulatory Visit: Payer: Self-pay

## 2018-01-28 ENCOUNTER — Encounter (HOSPITAL_COMMUNITY): Payer: Self-pay

## 2018-01-28 ENCOUNTER — Ambulatory Visit (HOSPITAL_COMMUNITY)
Admission: EM | Admit: 2018-01-28 | Discharge: 2018-01-28 | Disposition: A | Discharge status: Home / Self Care | Payer: Medicare Other | Attending: Family Medicine | Admitting: Family Medicine

## 2018-01-28 DIAGNOSIS — J40 Bronchitis, not specified as acute or chronic: Secondary | ICD-10-CM

## 2018-01-28 DIAGNOSIS — R05 Cough: Secondary | ICD-10-CM | POA: Diagnosis not present

## 2018-01-28 MED ORDER — BENZONATATE 200 MG PO CAPS: 200.0000 mg | ORAL_CAPSULE | Freq: Three times a day (TID) | ORAL | 0 refills | Status: AC

## 2018-01-28 MED ORDER — DOXYCYCLINE HYCLATE 100 MG PO CAPS: 100.0000 mg | ORAL_CAPSULE | Freq: Two times a day (BID) | ORAL | 0 refills | Status: AC

## 2018-01-28 MED ORDER — IPRATROPIUM-ALBUTEROL 0.5-2.5 (3) MG/3ML IN SOLN
3.0000 mL | Freq: Once | RESPIRATORY_TRACT | Status: AC
Start: 2018-01-28 — End: 2018-01-28
  Administered 2018-01-28: 3 mL via RESPIRATORY_TRACT

## 2018-01-28 MED ORDER — ALBUTEROL SULFATE HFA 108 (90 BASE) MCG/ACT IN AERS: 1.0000 | INHALATION_SPRAY | Freq: Four times a day (QID) | RESPIRATORY_TRACT | 0 refills | Status: DC | PRN

## 2018-01-28 MED ORDER — IPRATROPIUM-ALBUTEROL 0.5-2.5 (3) MG/3ML IN SOLN
RESPIRATORY_TRACT | Status: AC
  Filled 2018-01-28: qty 3

## 2018-01-28 MED ORDER — PREDNISONE 50 MG PO TABS: 50.0000 mg | ORAL_TABLET | Freq: Every day | ORAL | 0 refills | Status: AC

## 2018-01-28 NOTE — Discharge Instructions (Signed)
Please begin doxycycline twice daily for the next 10 days.  Please use albuterol inhaler as needed for shortness of breath, wheezing and chest tightness.  Please take prednisone daily for the next 5 days.

## 2018-01-28 NOTE — ED Triage Notes (Signed)
Pt presents with complaints of productive cough x 5 days.

## 2018-01-29 NOTE — ED Provider Notes (Signed)
[place]    CSN: [id number] Arrival date & time: 01/28/18  1007     History   Chief Complaint Chief Complaint  Patient presents with  . Cough    HPI Stephen Adams is a 72 y.o. male history of CAD, chronic systolic CHF with ICD, hyperlipidemia presenting today with concern for a productive cough for 5 days.  States that he received his pneumonia vaccine approximately 1 week ago and since he has had a cough.  Denies any fevers.  Has been using over-the-counter Tessalon to help with his cough.  States that he has had minimal sore throat and congestion.  HPI  Past Medical History:  Diagnosis Date  . CAD (coronary artery disease)    a. 1989 PTCA LAD; b. 1999 BMS to LAD & RCA; c. 2000 PTCA RCA 2/2 ISR; d. 10/1999 Inf MI/VF Arrest: 4 stents to RCA 2/2 ISR; e. 05/2001 Cath: ISR RCA, sev LAD dzs, EF 35-40%-->CABG (details unknown); f. 09/2013 MV: Inferior infarct, no ischemia.  . Chronic systolic CHF (congestive heart failure) ([place])    a. 02/2015 Echo: EF 25-30%.  . DJD (degenerative joint disease)   . Hyperlipidemia   . Hypertensive heart disease   . 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 10/2008 s/p MDT [employer] single lead AICD. Ser # [id number] H.  . Ischemic cardiomyopathy    a. 02/2015 Echo: EF 25-30%, inf, apical AK, mid-apical-anteroseptal and ant HK, Gr1 DD, mild AI, mod dil LA.    Patient Active Problem List   Diagnosis Date Noted  . Ventricular tachycardia ([place]) 05/05/2016  . Injury of foot, left 03/23/2016  . Hyponatremia   . Syncope 03/02/2016  . Chronic systolic CHF (congestive heart failure) ([place])   . ICD (implantable cardioverter-defibrillator) battery depletion   . Ischemic cardiomyopathy   . Hyperlipidemia   . Hypertensive heart disease   . ICD (implantable cardioverter-defibrillator) in place 05/01/2015  . Cardiomyopathy, ischemic 02/28/2015  . CAD (coronary artery disease) 02/28/2015  . 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 CORONARY ARTERY BYPASS GRAFT     4 vessel [employer].  Dr. Burns Spain 06/2000  . EP IMPLANTABLE DEVICE N/A 05/05/2016   Procedure: ICD Generator Changeout;  Surgeon: Evans Lance, MD;  Location: [place] [facility];  Service: Cardiovascular;  Laterality: N/A;  . TOTAL KNEE ARTHROPLASTY Left   . TOTAL SHOULDER REPLACEMENT Bilateral        Home Medications    Prior to Admission medications   Medication Sig Start Date End Date Taking? Authorizing Provider  aspirin 81 MG tablet Take 81 mg by mouth daily.   Yes [provider]  carvedilol (COREG) 6.25 MG tablet TAKE 1 TABLET BY MOUTH TWICE A DAY 11/25/17  Yes Hochrein, Jeneen Rinks, MD  lovastatin (MEVACOR) 20 MG tablet TAKE 1 TABLET (20 MG TOTAL) BY MOUTH AT BEDTIME. 11/25/17  Yes Minus Breeding, MD  pantoprazole (PROTONIX) 40 MG tablet Take 1 tablet (40 mg total) by mouth daily. 07/28/17  Yes Hochrein, Jeneen Rinks, MD  sacubitril-valsartan (ENTRESTO) 97-103 MG Take 1 tablet 2 (two) times daily by mouth. 08/20/17  Yes Kilroy, Luke K, PA-C  albuterol (PROVENTIL HFA;VENTOLIN HFA) 108 (90 Base) MCG/ACT inhaler Inhale 1-2 puffs into the lungs every 6 (six) hours as needed for wheezing or shortness of breath. 01/28/18   Tziporah Knoke C, PA-C  benzonatate (TESSALON) 200 MG capsule Take 1 capsule (200 mg total) by mouth every 8 (  eight) hours for 5 days. 01/28/18 02/02/18  Isador Castille C, PA-C  doxycycline (VIBRAMYCIN) 100 MG capsule Take 1 capsule (100 mg total) by mouth 2 (two) times daily for 10 days. 01/28/18 02/07/18  Arcenia Scarbro C, PA-C  predniSONE (DELTASONE) 50 MG tablet Take 1 tablet (50 mg total) by mouth daily for 5 days. 01/28/18 02/02/18  Lin Hackmann, Elesa Hacker, PA-C    Family History Family History  Problem Relation Age of Onset  . Hypertension Father   . Heart Problems Father        bypass 1992  . CAD Mother   . Pancreatic cancer Mother   . Heart Problems Maternal Grandfather      Social History Social History   Tobacco Use  . Smoking status: Never Smoker  . Smokeless tobacco: Never Used  Substance Use Topics  . Alcohol use: Yes    Alcohol/week: 0.0 oz    Comment: occas  . Drug use: No     Allergies   Oxycodone-acetaminophen   Review of Systems Review of Systems  Constitutional: Positive for fatigue. Negative for activity change, appetite change and fever.  HENT: Positive for congestion. Negative for ear pain, postnasal drip, rhinorrhea, sinus pressure and sore throat.   Eyes: Negative for pain and itching.  Respiratory: Positive for cough and wheezing. Negative for shortness of breath.   Cardiovascular: Negative for chest pain.  Gastrointestinal: Negative for abdominal pain, diarrhea, nausea and vomiting.  Musculoskeletal: Negative for myalgias.  Skin: Negative for rash.  Neurological: Negative for dizziness, light-headedness and headaches.     Physical Exam Triage Vital Signs ED Triage Vitals  Enc Vitals Group     BP 01/28/18 1048 123/84     Pulse Rate 01/28/18 1048 (!) 55     Resp 01/28/18 1048 19     Temp 01/28/18 1048 98.2 F (36.8 C)     Temp src --      SpO2 01/28/18 1048 96 %     Weight 01/28/18 1049 214 lb (97.1 kg)     Height --      Head Circumference --      Peak Flow --      Pain Score 01/28/18 1049 6     Pain Loc --      Pain Edu? --      Excl. in [place]? --    No data found.  Updated Vital Signs BP 123/84   Pulse (!) 55   Temp 98.2 F (36.8 C)   Resp 19   Wt 214 lb (97.1 kg)   SpO2 96%   BMI 30.27 kg/m   Visual Acuity Right Eye Distance:   Left Eye Distance:   Bilateral Distance:    Right Eye Near:   Left Eye Near:    Bilateral Near:     Physical Exam  Constitutional: He appears well-developed and well-nourished.  Breathing comfortably at rest  HENT:  Head: Normocephalic and atraumatic.  Mouth/Throat: Oropharynx is clear and moist.  Eyes: Conjunctivae are normal.  Neck: Neck supple.   Cardiovascular: Regular rhythm.  No murmur heard. Irregular rate  Pulmonary/Chest: Effort normal and breath sounds normal. No respiratory distress.  Significant wheezing and rhonchi throughout bilateral lung fields, moderately improved after breathing treatment  Abdominal: Soft. There is no tenderness.  Musculoskeletal: He exhibits no edema.  Neurological: He is alert.  Skin: Skin is warm and dry.  Psychiatric: He has a normal mood and affect.  Nursing note and vitals reviewed.    UC Treatments /  Results  Labs (all labs ordered are listed, but only abnormal results are displayed) Labs Reviewed - No data to display  EKG None Radiology Dg Chest 2 View  Result Date: 01/28/2018 CLINICAL DATA:  Cough, wheezing EXAM: CHEST - 2 VIEW COMPARISON:  03/02/2016 FINDINGS: Left AICD remains in place, unchanged. Prior CABG. Cardiomegaly. Vascular congestion and interstitial prominence. Bibasilar opacities likely atelectasis. No visible significant effusions or acute bony abnormality. Prior bilateral shoulder replacements. IMPRESSION: Cardiomegaly with vascular congestion and interstitial prominence which may reflect mild interstitial edema. Bibasilar atelectasis. Electronically Signed   By: Rolm Baptise M.D.   On: 01/28/2018 11:37    Procedures Procedures (including critical care time)  Medications Ordered in UC Medications  ipratropium-albuterol (DUONEB) 0.5-2.5 (3) MG/3ML nebulizer solution 3 mL (3 mLs Nebulization Given 01/28/18 1141)     Initial Impression / Assessment and Plan / UC Course  I have reviewed the triage vital signs and the nursing notes.  Pertinent labs & imaging results that were available during my care of the patient were reviewed by me and considered in my medical decision making (see chart for details).     Patient with productive cough, chest x-ray showing mild congestion/interstitial edema.  Most likely bronchitis. Breathing treatment provided in clinic with some  improvement.  Will send home with albuterol inhaler, prednisone, doxycycline and Tessalon.  Patient's heart rate was slow and irregular, EKG obtained, did show possible first-degree block with occasional PVCs, T wave inversions, but this is not new compared to his old EKG.  Patient states that his heart rate is normally in the 40s.  Discussed strict return precautions. Patient verbalized understanding and is agreeable with plan.   Final Clinical Impressions(s) / UC Diagnoses   Final diagnoses:  Bronchitis    ED Discharge Orders        Ordered    albuterol (PROVENTIL HFA;VENTOLIN HFA) 108 (90 Base) MCG/ACT inhaler  Every 6 hours PRN     01/28/18 1149    predniSONE (DELTASONE) 50 MG tablet  Daily     01/28/18 1149    doxycycline (VIBRAMYCIN) 100 MG capsule  2 times daily     01/28/18 1149    benzonatate (TESSALON) 200 MG capsule  Every 8 hours     01/28/18 1150       Controlled Substance Prescriptions [place] Controlled Substance Registry consulted? Not Applicable   Janith Lima, [place] 01/29/18 1143

## 2018-02-16 ENCOUNTER — Other Ambulatory Visit: Payer: Self-pay | Admitting: Cardiology

## 2018-02-17 NOTE — Progress Notes (Signed)
Cardiology Office Note   barely been never been no Date:  02/18/2018   ID:  Stephen Adams, DOB 06/03/1946, MRN [id number]  PCP:  Josetta Huddle, MD  Cardiologist:   Minus Breeding, MD    Chief Complaint  Patient presents with  . Coronary Artery Disease      History of Present Illness: Stephen Adams is a 72 y.o. male who presents  for evaluation of ischemic cardiomyopathy and CAD.  The patient had a myocardial infarction several years ago at the time of left shoulder surgery. Because he was just post surgery he did not get angioplasty. He had a resultant ischemic cardiomyopathy. He did later get bypass and then an ICD.  He saw Dr. Lovena Le because he had an alarm on his ICD that  was falsely reporting atrial fib.  At the last visit I started him on Entresto.   Since I last saw him he was in the ED with calf pain.  However, he did not have a DVT.    He has had leg swelling since he drove [place].  It goes down when he goes to sleep at night but it comes back during the day.  He denies any shortness of breath, PND or orthopnea.  He is careful with his salt  He has not had any new  chest pain, neck or arm discomfort.  He does not notice palpitations had no presyncope or syncope.  His heart rate has been recorded as low but is not affected by this.  He sleeps with CPAP.  He walks up hills without any difficulty.  Past Medical History:  Diagnosis Date  . CAD (coronary artery disease)    a. 1989 PTCA LAD; b. 1999 BMS to LAD & RCA; c. 2000 PTCA RCA 2/2 ISR; d. 10/1999 Inf MI/VF Arrest: 4 stents to RCA 2/2 ISR; e. 05/2001 Cath: ISR RCA, sev LAD dzs, EF 35-40%-->CABG (details unknown); f. 09/2013 MV: Inferior infarct, no ischemia.  . Chronic systolic CHF (congestive heart failure) ([place])    a. 02/2015 Echo: EF 25-30%.  . DJD (degenerative joint disease)   . Hyperlipidemia   . Hypertensive heart disease   . 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 10/2008 s/p MDT [employer]  DR single lead AICD. Ser # [id number] H.  . Ischemic cardiomyopathy    a. 02/2015 Echo: EF 25-30%, inf, apical AK, mid-apical-anteroseptal and ant HK, Gr1 DD, mild AI, mod dil LA.    Past Surgical History:  Procedure Laterality Date  . CORONARY ARTERY BYPASS GRAFT     4 vessel [employer].  Dr. Burns Spain 06/2000  . EP IMPLANTABLE DEVICE N/A 05/05/2016   Procedure: ICD Generator Changeout;  Surgeon: Evans Lance, MD;  Location: [place] [facility];  Service: Cardiovascular;  Laterality: N/A;  . TOTAL KNEE ARTHROPLASTY Left   . TOTAL SHOULDER REPLACEMENT Bilateral      Current Outpatient Medications  Medication Sig Dispense Refill  . albuterol (PROVENTIL HFA;VENTOLIN HFA) 108 (90 Base) MCG/ACT inhaler Inhale 1-2 puffs into the lungs every 6 (six) hours as needed for wheezing or shortness of breath. 1 Inhaler 0  . aspirin 81 MG tablet Take 81 mg by mouth daily.    . carvedilol (COREG) 6.25 MG tablet TAKE 1 TABLET BY MOUTH TWICE A DAY 180 tablet 1  . ENTRESTO 97-103 MG TAKE 1 TABLET BY MOUTH 2 (TWO) TIMES DAILY 60 tablet 6  . pantoprazole (PROTONIX) 40 MG tablet Take 1 tablet (40 mg total) by  mouth daily. 90 tablet 3  . atorvastatin (LIPITOR) 20 MG tablet Take 1 tablet (20 mg total) by mouth daily. 90 tablet 3  . furosemide (LASIX) 20 MG tablet Take 1 tablet (20 mg total) by mouth daily. 30 tablet 0   No current facility-administered medications for this visit.     Allergies:   Oxycodone-acetaminophen    ROS:  Please see the history of present illness.   Otherwise, review of systems are positive for none.   All other systems are reviewed and negative.    PHYSICAL EXAM: VS:  BP 96/62   Pulse (!) 55   Ht 5\' 10"  (1.778 m)   Wt 208 lb 12.8 oz (94.7 kg)   BMI 29.96 kg/m  , BMI Body mass index is 29.96 kg/m.  GENERAL:  Well appearing NECK:  No jugular venous distention, waveform within normal limits, carotid upstroke brisk and symmetric, no bruits, no thyromegaly LUNGS:  Clear to  auscultation bilaterally CHEST:  Well healed ICD pocket and sternal scar HEART:  PMI not displaced or sustained,S1 and S2 within normal limits, no S3, no S4, no clicks, no rubs, no murmurs ABD:  Flat, positive bowel sounds normal in frequency in pitch, no bruits, no rebound, no guarding, no midline pulsatile mass, no hepatomegaly, no splenomegaly EXT:  2 plus pulses throughout, no edema, no cyanosis no clubbing    EKG:  EKG is ordered today. Ventricular pacing, premature ventricular contractions in a bigeminal pattern   Recent Labs: 08/27/2017: BUN 11; Creatinine, Ser 0.75; Potassium 4.3; Sodium 137    Lipid Panel    Wt Readings from Last 3 Encounters:  02/18/18 208 lb 12.8 oz (94.7 kg)  01/28/18 214 lb (97.1 kg)  11/23/17 219 lb (99.3 kg)      Other studies Reviewed: Additional studies/ records that were reviewed today include:  Labs, ED records Review of the above records demonstrates:       ASSESSMENT AND PLAN:   Ischemic cardiomyopathy He has an ischemic cardiomyopathy with class II symptoms.   He tolerates medications as listed but could not tolerate med titration as his blood pressure would not allow.  He will continue the meds as listed.  Hx of CABG The patient has no new sypmtoms.  He has had no new symptoms since stress testing in 2014.  He will continue with risk reduction.  No further testing is planned.  ICD (implantable cardioverter-defibrillator)  The patient is up to date with ICD follow up and I reviewed this report.  It is difficult on his EKG to be sure of the underlying rhythm.  However, I checked again with the device clinic and there has been no atrial fib.  He is going to have this checked again next week.  No change in therapy.    Hyperlipidemia LDL recently was 121.  I am going to switch him from Mevacor to Lipitor 20 mg daily and check a liver and lipid profile in 8 weeks.   Edema He is going to wear compression stockings and take Lasix 20 mg  daily x 2 days and keep his feet up.  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   He is scheduled to have follow up of this in July.     Current medicines are reviewed at length with the patient today.  The patient does not have concerns regarding medicines.  The following changes have been made:  As above  Labs/ tests ordered today include:    Orders Placed This Encounter  Procedures  .  Lipid panel  . Hepatic function panel  . EKG 12-Lead     Disposition:   FU with me in 6 months.     Signed, Minus Breeding, MD  02/18/2018 12:49 PM    [facility]

## 2018-02-18 ENCOUNTER — Ambulatory Visit (INDEPENDENT_AMBULATORY_CARE_PROVIDER_SITE_OTHER): Payer: Medicare Other | Admitting: Cardiology

## 2018-02-18 ENCOUNTER — Encounter: Payer: Self-pay | Admitting: Cardiology

## 2018-02-18 VITALS — BP 96/62 | HR 55 | Ht 70.0 in | Wt 208.8 lb

## 2018-02-18 DIAGNOSIS — M7989 Other specified soft tissue disorders: Secondary | ICD-10-CM | POA: Diagnosis not present

## 2018-02-18 DIAGNOSIS — Z79899 Other long term (current) drug therapy: Secondary | ICD-10-CM | POA: Diagnosis not present

## 2018-02-18 DIAGNOSIS — E785 Hyperlipidemia, unspecified: Secondary | ICD-10-CM

## 2018-02-18 DIAGNOSIS — I255 Ischemic cardiomyopathy: Secondary | ICD-10-CM

## 2018-02-18 MED ORDER — FUROSEMIDE 20 MG PO TABS: 20.0000 mg | ORAL_TABLET | Freq: Every day | ORAL | 0 refills | Status: DC

## 2018-02-18 MED ORDER — ATORVASTATIN CALCIUM 20 MG PO TABS: 20.0000 mg | ORAL_TABLET | Freq: Every day | ORAL | 3 refills | Status: DC

## 2018-02-18 NOTE — Patient Instructions (Signed)
Medication Instructions:  STOP- Lovastatin START- Atorvastatin  START- Lasix 20 mg take 1 tablet daily for 2 days  If you need a refill on your cardiac medications before your next appointment, please call your pharmacy.  Labwork: Fasting Lipid Liver in 8 weeks HERE IN OUR OFFICE AT [facility]  Take the provided lab slips with you to the lab for your blood draw.   You will need to fast. DO NOT EAT OR DRINK PAST MIDNIGHT.   Testing/Procedures: None Ordered  Follow-Up: Your physician wants you to follow-up in: 6 Months. You should receive a reminder letter in the mail two months in advance. If you do not receive a letter, please call our office [phone].    Thank you for choosing [facility] at [facility]!!

## 2018-02-22 ENCOUNTER — Ambulatory Visit (INDEPENDENT_AMBULATORY_CARE_PROVIDER_SITE_OTHER): Payer: Medicare Other | Admitting: *Deleted

## 2018-02-22 DIAGNOSIS — I255 Ischemic cardiomyopathy: Secondary | ICD-10-CM | POA: Diagnosis not present

## 2018-02-23 LAB — CUP PACEART REMOTE DEVICE CHECK
Battery Remaining Longevity: 123 mo
Battery Voltage: 3 V
HighPow Impedance: 39 Ohm
HighPow Impedance: 51 Ohm
Implantable Lead Location: 753860
Implantable Pulse Generator Implant Date: 20170725
Lead Channel Impedance Value: 399 Ohm
Lead Channel Setting Pacing Amplitude: 2.75 V
Lead Channel Setting Pacing Pulse Width: 0.4 ms
Lead Channel Setting Sensing Sensitivity: 0.3 mV
MDC IDC LEAD IMPLANT DT: 20100104
MDC IDC MSMT LEADCHNL RV IMPEDANCE VALUE: 285 Ohm
MDC IDC MSMT LEADCHNL RV PACING THRESHOLD AMPLITUDE: 1.375 V
MDC IDC MSMT LEADCHNL RV PACING THRESHOLD PULSEWIDTH: 0.4 ms
MDC IDC MSMT LEADCHNL RV SENSING INTR AMPL: 4.375 mV
MDC IDC MSMT LEADCHNL RV SENSING INTR AMPL: 4.375 mV
MDC IDC SESS DTM: 20190514145959
MDC IDC STAT BRADY RV PERCENT PACED: 36.56 %

## 2018-02-23 NOTE — Progress Notes (Signed)
Remote ICD transmission.   

## 2018-02-24 ENCOUNTER — Encounter: Payer: Self-pay | Admitting: Cardiology

## 2018-04-20 ENCOUNTER — Other Ambulatory Visit: Payer: Self-pay | Admitting: *Deleted

## 2018-04-20 ENCOUNTER — Other Ambulatory Visit: Payer: Self-pay | Admitting: Cardiology

## 2018-04-20 MED ORDER — CARVEDILOL 6.25 MG PO TABS: 6.2500 mg | ORAL_TABLET | Freq: Two times a day (BID) | ORAL | 1 refills | Status: DC

## 2018-04-25 ENCOUNTER — Telehealth: Payer: Self-pay | Admitting: Cardiology

## 2018-04-25 NOTE — Telephone Encounter (Signed)
Pt calling and want to know if he need to take amoxicillin before his teeth cleaning. Please call pt.

## 2018-04-25 NOTE — Telephone Encounter (Signed)
Spoke to patient. Per protocol, no  Heart valve or congential heart disease.  Patient  Does not need antibiotics for dental work.  patient aware.

## 2018-04-26 ENCOUNTER — Other Ambulatory Visit (HOSPITAL_COMMUNITY)
Admission: RE | Admit: 2018-04-26 | Discharge: 2018-04-26 | Disposition: A | Discharge status: Home / Self Care | Payer: Medicare Other | Source: Ambulatory Visit | Attending: Cardiology | Admitting: Cardiology

## 2018-04-26 DIAGNOSIS — Z08 Encounter for follow-up examination after completed treatment for malignant neoplasm: Secondary | ICD-10-CM | POA: Diagnosis not present

## 2018-04-26 DIAGNOSIS — Z1283 Encounter for screening for malignant neoplasm of skin: Secondary | ICD-10-CM | POA: Diagnosis not present

## 2018-04-26 DIAGNOSIS — E785 Hyperlipidemia, unspecified: Secondary | ICD-10-CM | POA: Insufficient documentation

## 2018-04-26 DIAGNOSIS — I255 Ischemic cardiomyopathy: Secondary | ICD-10-CM | POA: Insufficient documentation

## 2018-04-26 DIAGNOSIS — Z79899 Other long term (current) drug therapy: Secondary | ICD-10-CM | POA: Diagnosis not present

## 2018-04-26 DIAGNOSIS — Z85820 Personal history of malignant melanoma of skin: Secondary | ICD-10-CM | POA: Diagnosis not present

## 2018-04-26 LAB — LIPID PANEL
CHOL/HDL RATIO: 4.3 ratio
CHOLESTEROL: 119 mg/dL (ref 0–200)
HDL: 28 mg/dL — ABNORMAL LOW (ref >40)
LDL CALC: 81 mg/dL (ref 0–99)
Triglycerides: 52 mg/dL (ref <150)
VLDL: 10 mg/dL (ref 0–40)

## 2018-04-26 LAB — HEPATIC FUNCTION PANEL
ALBUMIN: 4 g/dL (ref 3.5–5.0)
ALT: 15 U/L (ref 0–44)
AST: 21 U/L (ref 15–41)
Alkaline Phosphatase: 69 U/L (ref 38–126)
BILIRUBIN DIRECT: 0.2 mg/dL (ref 0.0–0.2)
BILIRUBIN TOTAL: 1.6 mg/dL — AB (ref 0.3–1.2)
Indirect Bilirubin: 1.4 mg/dL — ABNORMAL HIGH (ref 0.3–0.9)
Total Protein: 6.5 g/dL (ref 6.5–8.1)

## 2018-04-27 ENCOUNTER — Ambulatory Visit (HOSPITAL_COMMUNITY)
Admission: RE | Admit: 2018-04-27 | Discharge: 2018-04-27 | Disposition: A | Discharge status: Home / Self Care | Payer: Medicare Other | Source: Ambulatory Visit | Attending: Vascular Surgery | Admitting: Vascular Surgery

## 2018-04-27 ENCOUNTER — Other Ambulatory Visit: Payer: Self-pay

## 2018-04-27 ENCOUNTER — Encounter: Payer: Self-pay | Admitting: Vascular Surgery

## 2018-04-27 ENCOUNTER — Ambulatory Visit (INDEPENDENT_AMBULATORY_CARE_PROVIDER_SITE_OTHER): Payer: Medicare Other | Admitting: Vascular Surgery

## 2018-04-27 DIAGNOSIS — E785 Hyperlipidemia, unspecified: Secondary | ICD-10-CM | POA: Insufficient documentation

## 2018-04-27 DIAGNOSIS — I714 Abdominal aortic aneurysm, without rupture, unspecified: Secondary | ICD-10-CM

## 2018-04-27 DIAGNOSIS — I2510 Atherosclerotic heart disease of native coronary artery without angina pectoris: Secondary | ICD-10-CM | POA: Insufficient documentation

## 2018-04-27 DIAGNOSIS — I255 Ischemic cardiomyopathy: Secondary | ICD-10-CM

## 2018-04-27 NOTE — Progress Notes (Signed)
Requested by: Josetta Huddle, MD [address] [place], [place] [address]  Reason for consultation: AAA   History of Present Illness   Stephen Adams is a 72 y.o. (06/14/1946) male who presents with chief complaint: incidentally found AAA.  Previous studies demonstrate an AAA on a screening study for abdominal pain.  The patient does not have back or abdominal pain.  The patient notes not history of embolic episodes from the AAA.  The patient's risk factors for AAA included: age and male sex.  The patient's father had a AAA and had to be repair once older.  The patient does not smoke cigarettes.  Past Medical History:  Diagnosis Date  . CAD (coronary artery disease)    a. 1989 PTCA LAD; b. 1999 BMS to LAD & RCA; c. 2000 PTCA RCA 2/2 ISR; d. 10/1999 Inf MI/VF Arrest: 4 stents to RCA 2/2 ISR; e. 05/2001 Cath: ISR RCA, sev LAD dzs, EF 35-40%-->CABG (details unknown); f. 09/2013 MV: Inferior infarct, no ischemia.  . Chronic systolic CHF (congestive heart failure) ([place])    a. 02/2015 Echo: EF 25-30%.  . DJD (degenerative joint disease)   . Hyperlipidemia   . Hypertensive heart disease   . 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 10/2008 s/p MDT [employer] [address] single lead AICD. Ser # [id number] H.  . Ischemic cardiomyopathy    a. 02/2015 Echo: EF 25-30%, inf, apical AK, mid-apical-anteroseptal and ant HK, Gr1 DD, mild AI, mod dil LA.    Past Surgical History:  Procedure Laterality Date  . CORONARY ARTERY BYPASS GRAFT     4 vessel [employer].  Dr. Burns Spain 06/2000  . EP IMPLANTABLE DEVICE N/A 05/05/2016   Procedure: ICD Generator Changeout;  Surgeon: Evans Lance, MD;  Location: [facility];  Service: Cardiovascular;  Laterality: N/A;  . TOTAL KNEE ARTHROPLASTY Left   . TOTAL SHOULDER REPLACEMENT Bilateral     Social History   Socioeconomic History  . Marital status: Married    Spouse name: Not on file  . Number of children: 4  . Years  of education: Not on file  . Highest education level: Not on file  Occupational History  . Occupation: [employer]  . Financial resource strain: Not on file  . Food insecurity:    Worry: Not on file    Inability: Not on file  . Transportation needs:    Medical: Not on file    Non-medical: Not on file  Tobacco Use  . Smoking status: Never Smoker  . Smokeless tobacco: Never Used  Substance and Sexual Activity  . Alcohol use: Yes    Alcohol/week: 0.0 oz    Comment: occas  . Drug use: No  . Sexual activity: Not on file  Lifestyle  . Physical activity:    Days per week: Not on file    Minutes per session: Not on file  . Stress: Not on file  Relationships  . Social connections:    Talks on phone: Not on file    Gets together: Not on file    Attends religious service: Not on file    Active member of club or organization: Not on file    Attends meetings of clubs or organizations: Not on file    Relationship status: Not on file  . Intimate partner violence:    Fear of current or ex partner: Not on file    Emotionally abused: Not on file  Physically abused: Not on file    Forced sexual activity: Not on file  Other Topics Concern  . 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      Family History  Problem Relation Age of Onset  . Hypertension Father   . Heart Problems Father        bypass 1992  . CAD Mother   . Pancreatic cancer Mother   . Heart Problems Maternal Grandfather     Current Outpatient Medications  Medication Sig Dispense Refill  . albuterol (PROVENTIL HFA;VENTOLIN HFA) 108 (90 Base) MCG/ACT inhaler Inhale 1-2 puffs into the lungs every 6 (six) hours as needed for wheezing or shortness of breath. 1 Inhaler 0  . aspirin 81 MG tablet Take 81 mg by mouth daily.    Marland Kitchen atorvastatin (LIPITOR) 20 MG tablet Take 1 tablet (20 mg total) by mouth daily. 90 tablet 3  . carvedilol (COREG) 6.25 MG tablet Take 1  tablet (6.25 mg total) by mouth 2 (two) times daily. 180 tablet 1  . ENTRESTO 97-103 MG TAKE 1 TABLET BY MOUTH 2 (TWO) TIMES DAILY 60 tablet 5  . furosemide (LASIX) 20 MG tablet Take 1 tablet (20 mg total) by mouth daily. 30 tablet 0  . pantoprazole (PROTONIX) 40 MG tablet Take 1 tablet (40 mg total) by mouth daily. 90 tablet 3   No current facility-administered medications for this visit.      Allergies  Allergen Reactions  . Oxycodone-Acetaminophen Nausea Only    REVIEW OF SYSTEMS (negative unless checked):   Cardiac:  []  Chest pain or chest pressure? []  Shortness of breath upon activity? []  Shortness of breath when lying flat? []  Irregular heart rhythm?  Vascular:  []  Pain in calf, thigh, or hip brought on by walking? []  Pain in feet at night that wakes you up from your sleep? []  Blood clot in your veins? [x]  Leg swelling?  Pulmonary:  []  Oxygen at home? []  Productive cough? []  Wheezing?  Neurologic:  []  Sudden weakness in arms or legs? []  Sudden numbness in arms or legs? []  Sudden onset of difficult speaking or slurred speech? []  Temporary loss of vision in one eye? []  Problems with dizziness?  Gastrointestinal:  []  Blood in stool? []  Vomited blood?  Genitourinary:  []  Burning when urinating? []  Blood in urine?  Psychiatric:  []  Major depression  Hematologic:  []  Bleeding problems? []  Problems with blood clotting?  Dermatologic:  []  Rashes or ulcers?  Constitutional:  []  Fever or chills?  Ear/Nose/Throat:  []  Change in hearing? []  Nose bleeds? []  Sore throat?  Musculoskeletal:  []  Back pain? []  Joint pain? []  Muscle pain?   For VQI Use Only   PRE-ADM LIVING Home  AMB STATUS Ambulatory  CAD Sx None  PRIOR CHF None  STRESS TEST No   Physical Examination     Vitals:   04/27/18 1031  BP: (!) 142/82  Pulse: 78  Resp: 20  Temp: (!) 97.3 F (36.3 C)  TempSrc: Oral  SpO2: 95%  Weight: 204 lb (92.5 kg)  Height: 5\' 10"  (1.778 m)    Body mass index is 29.27 kg/m.  General Alert, O x 3, WD, NAD  Head [place]/AT,    Ear/Nose/ Throat Hearing grossly intact, nares without erythema or drainage, oropharynx without Erythema or Exudate, Mallampati score: 3,   Eyes PERRLA, EOMI,    Neck Supple, mid-line trachea,    Pulmonary Sym exp, good B air  movt, CTA B  Cardiac RRR, Nl S1, S2, no Murmurs, No rubs, No S3,S4  Vascular Vessel Right Left  Radial Palpable Palpable  Brachial Palpable Palpable  Carotid Palpable, No Bruit Palpable, No Bruit  Aorta Not palpable N/A  Femoral Palpable Palpable  Popliteal Not palpable Not palpable  PT Not palpable Not palpable  DP Faintly palpable Faintly palpable    Gastro- intestinal soft, non-distended, non-tender to palpation, No guarding or rebound, no HSM, no masses, no CVAT B, No palpable prominent aortic pulse,    Musculo- skeletal M/S 5/5 throughout  , Extremities without ischemic changes  , Non-pitting edema present: 1-2+ B, Varicosities present: B small, No Lipodermatosclerosis present  Neurologic Cranial nerves 2-12 intact , Pain and light touch intact in extremities , Motor exam as listed above  Psychiatric Judgement intact, Mood & affect appropriate for pt's clinical situation  Dermatologic See M/S exam for extremity exam, No rashes otherwise noted  Lymphatic  Palpable lymph nodes: None    Non-Invasive Vascular Imaging   AAA Duplex (04/27/2018)  Distal 3.15 cm x 3.2 cm  L CIA: not seen  R CIA: 1.5 cm   Outside Studies/Documentation   10 pages of outside documents were reviewed including: outside PCP records.   Medical Decision Making   Stephen Adams is a 33 y.o. (1945/12/24) male who presents with: asx Small AAA, BLE CVI   Based on this patient's exam and diagnostic studies, he needs:  q2 year AAA duplex.  The threshold for repair is AAA size > 5.5 cm, growth > 1 cm/yr, and symptomatic status.  I emphasized the importance of maximal medical management including  strict control of blood pressure, blood glucose, and lipid levels, antiplatelet agents, obtaining regular exercise, and cessation of smoking.   In regards to BLE CVI, pt is relatively asx and is already using compression stockings.  Thank you for allowing [place] to participate in this patient's care.   Adele Barthel, MD, FACS [facility] Office: [phone] Pager: [phone]  04/27/2018, 11:17 AM

## 2018-05-25 ENCOUNTER — Telehealth: Payer: Self-pay | Admitting: Cardiology

## 2018-05-25 ENCOUNTER — Ambulatory Visit (INDEPENDENT_AMBULATORY_CARE_PROVIDER_SITE_OTHER): Payer: Medicare Other | Admitting: *Deleted

## 2018-05-25 DIAGNOSIS — H35033 Hypertensive retinopathy, bilateral: Secondary | ICD-10-CM | POA: Diagnosis not present

## 2018-05-25 DIAGNOSIS — H35032 Hypertensive retinopathy, left eye: Secondary | ICD-10-CM | POA: Diagnosis not present

## 2018-05-25 DIAGNOSIS — I255 Ischemic cardiomyopathy: Secondary | ICD-10-CM | POA: Diagnosis not present

## 2018-05-25 DIAGNOSIS — H25813 Combined forms of age-related cataract, bilateral: Secondary | ICD-10-CM | POA: Diagnosis not present

## 2018-05-25 DIAGNOSIS — H35031 Hypertensive retinopathy, right eye: Secondary | ICD-10-CM | POA: Diagnosis not present

## 2018-05-25 DIAGNOSIS — I5022 Chronic systolic (congestive) heart failure: Secondary | ICD-10-CM

## 2018-05-25 DIAGNOSIS — H43813 Vitreous degeneration, bilateral: Secondary | ICD-10-CM | POA: Diagnosis not present

## 2018-05-25 DIAGNOSIS — I10 Essential (primary) hypertension: Secondary | ICD-10-CM | POA: Diagnosis not present

## 2018-05-25 NOTE — Progress Notes (Signed)
Remote ICD transmission.   

## 2018-05-25 NOTE — Telephone Encounter (Signed)
Spoke with pt and reminded pt of remote transmission that is due today. Pt verbalized understanding.   

## 2018-05-28 DIAGNOSIS — M79642 Pain in left hand: Secondary | ICD-10-CM | POA: Diagnosis not present

## 2018-05-28 DIAGNOSIS — R2232 Localized swelling, mass and lump, left upper limb: Secondary | ICD-10-CM | POA: Diagnosis not present

## 2018-06-19 ENCOUNTER — Other Ambulatory Visit: Payer: Self-pay | Admitting: Cardiology

## 2018-06-19 LAB — CUP PACEART REMOTE DEVICE CHECK
Battery Remaining Longevity: 120 mo
Battery Voltage: 3.01 V
Date Time Interrogation Session: 20190814201235
HighPow Impedance: 39 Ohm
HighPow Impedance: 50 Ohm
Implantable Lead Location: 753860
Implantable Lead Model: 6947
Implantable Pulse Generator Implant Date: 20170725
Lead Channel Impedance Value: 399 Ohm
Lead Channel Setting Pacing Amplitude: 2.75 V
Lead Channel Setting Pacing Pulse Width: 0.4 ms
MDC IDC LEAD IMPLANT DT: 20100104
MDC IDC MSMT LEADCHNL RV IMPEDANCE VALUE: 304 Ohm
MDC IDC MSMT LEADCHNL RV PACING THRESHOLD AMPLITUDE: 1.375 V
MDC IDC MSMT LEADCHNL RV PACING THRESHOLD PULSEWIDTH: 0.4 ms
MDC IDC MSMT LEADCHNL RV SENSING INTR AMPL: 11.875 mV
MDC IDC MSMT LEADCHNL RV SENSING INTR AMPL: 11.875 mV
MDC IDC SET LEADCHNL RV SENSING SENSITIVITY: 0.3 mV
MDC IDC STAT BRADY RV PERCENT PACED: 32.64 %

## 2018-07-06 ENCOUNTER — Telehealth: Payer: Self-pay | Admitting: Cardiology

## 2018-07-06 NOTE — Telephone Encounter (Signed)
Spoke with pt. Pt sts that he has been traveling and recent returned home. He is normally pretty active. In the last 1-2 days he has noted that he is experiencing increased sob with exertion. No weights provided.  He wears his compression stockings daily as instructed by Dr.Hochrein. He has Lasix 20mg  on hand but does not take it daily and has not taken any recently.  He denies orthopnea, or worsening swelling. Adv pt to go ahead and take a 20mg  Lasix now.  appt scheduled with Jory Sims D-NP for tomorrow @ [place] pt that if his symptoms worsen over night he should go to the ED for eval. Pt verbalized understanding.

## 2018-07-06 NOTE — Telephone Encounter (Signed)
New Message:    Pt c/o Shortness Of Breath: STAT if SOB developed within the last 24 hours or pt is noticeably SOB on the phone  1. Are you currently SOB (can you hear that pt is SOB on the phone)?  No   2. How long have you been experiencing SOB? 3 Weeks  3. Are you SOB when sitting or when up moving around? Moving Around   4. Are you currently experiencing any other symptoms? NO

## 2018-07-07 ENCOUNTER — Ambulatory Visit (HOSPITAL_COMMUNITY): Payer: Medicare Other | Attending: Cardiovascular Disease

## 2018-07-07 ENCOUNTER — Encounter: Payer: Self-pay | Admitting: Adult Health

## 2018-07-07 ENCOUNTER — Other Ambulatory Visit: Payer: Self-pay

## 2018-07-07 ENCOUNTER — Ambulatory Visit (INDEPENDENT_AMBULATORY_CARE_PROVIDER_SITE_OTHER): Payer: Medicare Other | Admitting: Adult Health

## 2018-07-07 ENCOUNTER — Telehealth (HOSPITAL_COMMUNITY): Payer: Self-pay

## 2018-07-07 VITALS — BP 122/74 | HR 51 | Ht 71.0 in | Wt 213.0 lb

## 2018-07-07 DIAGNOSIS — R609 Edema, unspecified: Secondary | ICD-10-CM | POA: Insufficient documentation

## 2018-07-07 DIAGNOSIS — I255 Ischemic cardiomyopathy: Secondary | ICD-10-CM

## 2018-07-07 DIAGNOSIS — I509 Heart failure, unspecified: Secondary | ICD-10-CM | POA: Insufficient documentation

## 2018-07-07 DIAGNOSIS — Z95810 Presence of automatic (implantable) cardiac defibrillator: Secondary | ICD-10-CM | POA: Insufficient documentation

## 2018-07-07 DIAGNOSIS — R0602 Shortness of breath: Secondary | ICD-10-CM | POA: Insufficient documentation

## 2018-07-07 DIAGNOSIS — Z951 Presence of aortocoronary bypass graft: Secondary | ICD-10-CM | POA: Diagnosis not present

## 2018-07-07 DIAGNOSIS — R55 Syncope and collapse: Secondary | ICD-10-CM | POA: Insufficient documentation

## 2018-07-07 DIAGNOSIS — E785 Hyperlipidemia, unspecified: Secondary | ICD-10-CM | POA: Insufficient documentation

## 2018-07-07 DIAGNOSIS — Z79899 Other long term (current) drug therapy: Secondary | ICD-10-CM | POA: Insufficient documentation

## 2018-07-07 DIAGNOSIS — I080 Rheumatic disorders of both mitral and aortic valves: Secondary | ICD-10-CM | POA: Diagnosis not present

## 2018-07-07 DIAGNOSIS — I110 Hypertensive heart disease with heart failure: Secondary | ICD-10-CM | POA: Insufficient documentation

## 2018-07-07 DIAGNOSIS — Z8249 Family history of ischemic heart disease and other diseases of the circulatory system: Secondary | ICD-10-CM | POA: Diagnosis not present

## 2018-07-07 DIAGNOSIS — I2510 Atherosclerotic heart disease of native coronary artery without angina pectoris: Secondary | ICD-10-CM | POA: Diagnosis not present

## 2018-07-07 DIAGNOSIS — I43 Cardiomyopathy in diseases classified elsewhere: Secondary | ICD-10-CM

## 2018-07-07 LAB — ECHOCARDIOGRAM COMPLETE
Height: 71 in
Weight: 3408 oz

## 2018-07-07 LAB — SPECIMEN STATUS REPORT

## 2018-07-07 MED ORDER — POTASSIUM CHLORIDE CRYS ER 20 MEQ PO TBCR: 20.0000 meq | EXTENDED_RELEASE_TABLET | Freq: Every day | ORAL | 3 refills | Status: DC

## 2018-07-07 MED ORDER — FUROSEMIDE 20 MG PO TABS: 20.0000 mg | ORAL_TABLET | Freq: Every day | ORAL | 0 refills | Status: DC

## 2018-07-07 NOTE — Telephone Encounter (Signed)
Encounter complete. 

## 2018-07-07 NOTE — Progress Notes (Signed)
Cardiology Office Note   Date:  07/07/2018   ID:  Stephen Adams, DOB 1946/07/05, MRN [id number]  PCP:  Stephen Huddle, MD  Cardiologist: [facility]   Chief Complaint  Patient presents with  . Coronary Artery Disease  . Shortness of Breath  . Congestive Heart Failure     History of Present Illness: Stephen Adams is a 72 y.o. male who presents for ongoing assessment and management of CAD. CABG, ICM most recent echo demonstrating EF of 25%-30% with Grade I diastolic dysfunction, moderately calcified AoV leaflets . ICD in situ, hyperlipidemia, and hypertension. He has chronic LEE. He was advised to wear compression stockings.   He called our office on 07/06/2018 to report worsening DOE. He had been traveling and when he got home the breathing status was worse. He is on lasix but has not been taking it consistently. He was advised to take lasix and be seen today.   He states he has noticed progressive fatigue and DOE over the last month. He has not been taking lasix at all for a few months as he has felt well. He has been hiking and walking over the summer in [place].   He denies chest pain but has been noticing 5-6 lbs weight gain and LEE. He states that after taking the lasix yesterday, that he had a good urine output, but no changes in his breathing status.   Past Medical History:  Diagnosis Date  . CAD (coronary artery disease)    a. 1989 PTCA LAD; b. 1999 BMS to LAD & RCA; c. 2000 PTCA RCA 2/2 ISR; d. 10/1999 Inf MI/VF Arrest: 4 stents to RCA 2/2 ISR; e. 05/2001 Cath: ISR RCA, sev LAD dzs, EF 35-40%-->CABG (details unknown); f. 09/2013 MV: Inferior infarct, no ischemia.  . Chronic systolic CHF (congestive heart failure) ([place])    a. 02/2015 Echo: EF 25-30%.  . DJD (degenerative joint disease)   . Hyperlipidemia   . Hypertensive heart disease   . 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 10/2008 s/p MDT [employer] single lead AICD. Ser # [id number] H.  .  Ischemic cardiomyopathy    a. 02/2015 Echo: EF 25-30%, inf, apical AK, mid-apical-anteroseptal and ant HK, Gr1 DD, mild AI, mod dil LA.    Past Surgical History:  Procedure Laterality Date  . CORONARY ARTERY BYPASS GRAFT     4 vessel [employer].  Dr. Burns Spain 06/2000  . EP IMPLANTABLE DEVICE N/A 05/05/2016   Procedure: ICD Generator Changeout;  Surgeon: Evans Lance, MD;  Location: [facility];  Service: Cardiovascular;  Laterality: N/A;  . TOTAL KNEE ARTHROPLASTY Left   . TOTAL SHOULDER REPLACEMENT Bilateral      Current Outpatient Medications  Medication Sig Dispense Refill  . albuterol (PROVENTIL HFA;VENTOLIN HFA) 108 (90 Base) MCG/ACT inhaler Inhale 1-2 puffs into the lungs every 6 (six) hours as needed for wheezing or shortness of breath. 1 Inhaler 0  . aspirin 81 MG tablet Take 81 mg by mouth daily.    Marland Kitchen atorvastatin (LIPITOR) 20 MG tablet TAKE 1 TABLET BY MOUTH EVERY DAY 30 tablet 6  . carvedilol (COREG) 6.25 MG tablet Take 1 tablet (6.25 mg total) by mouth 2 (two) times daily. 180 tablet 1  . ENTRESTO 97-103 MG TAKE 1 TABLET BY MOUTH 2 (TWO) TIMES DAILY 60 tablet 5  . pantoprazole (PROTONIX) 40 MG tablet TAKE 1 TABLET BY MOUTH EVERY DAY 30 tablet 6  . furosemide (LASIX) 20 MG tablet Take 1  tablet (20 mg total) by mouth daily. 20MG  QD X1WK THEN EOD 22 tablet 0  . potassium chloride SA (KLOR-CON M20) 20 MEQ tablet Take 1 tablet (20 mEq total) by mouth daily. 90 tablet 3   No current facility-administered medications for this visit.     Allergies:   Oxycodone-acetaminophen    Social History:  The patient  reports that he has never smoked. He has never used smokeless tobacco. He reports that he drinks alcohol. He reports that he does not use drugs.   Family History:  The patient's family history includes CAD in his mother; Heart Problems in his father and maternal grandfather; Hypertension in his father; Pancreatic cancer in his mother.    ROS: All other systems are  reviewed and negative. Unless otherwise mentioned in H&P    PHYSICAL EXAM: VS:  BP 122/74 (BP Location: Left Arm, Patient Position: Sitting, Cuff Size: Normal)   Pulse (!) 51   Ht 5\' 11"  (1.803 m)   Wt 213 lb (96.6 kg)   BMI 29.71 kg/m  , BMI Body mass index is 29.71 kg/m. GEN: Well nourished, well developed, in no acute distress HEENT: normal Neck: no JVD, carotid bruits, or masses Cardiac: IRRR; 2/6 systolic murmur, rubs, or gallops,2+ bilateral  pretibial edema  Respiratory:  Clear to auscultation bilaterally, normal work of breathing GI: soft, nontender, nondistended, + BS MS: no deformity or atrophy Skin: warm and dry, no rash Neuro:  Strength and sensation are intact Psych: euthymic mood, full affect   EKG:  V-paced with frequent PVC's.   Recent Labs: 08/27/2017: BUN 11; Creatinine, Ser 0.75; Potassium 4.3; Sodium 137 04/26/2018: ALT 15    Lipid Panel    Component Value Date/Time   CHOL 119 04/26/2018 0919   TRIG 52 04/26/2018 0919   HDL 28 (L) 04/26/2018 0919   CHOLHDL 4.3 04/26/2018 0919   VLDL 10 04/26/2018 0919   LDLCALC 81 04/26/2018 0919      Wt Readings from Last 3 Encounters:  07/07/18 213 lb (96.6 kg)  04/27/18 204 lb (92.5 kg)  02/18/18 208 lb 12.8 oz (94.7 kg)      Other studies Reviewed: Echocardiogram 03/27/2016 Left ventricle: The cavity size was moderately dilated. Wall   thickness was increased in a pattern of mild LVH. Systolic   function was severely reduced. The estimated ejection fraction   was in the range of 25% to 30%. Doppler parameters are consistent   with abnormal left ventricular relaxation (grade 1 diastolic   dysfunction). - Aortic valve: Mildly calcified annulus. Moderately thickened,   moderately calcified leaflets. - Left atrium: The atrium was severely dilated. - Right ventricle: The cavity size was mildly dilated. Systolic   function was moderately reduced    ASSESSMENT AND PLAN:  1.  CAD: Hx of a. 1989 PTCA  LAD; b. 1999 BMS to LAD & RCA; c. 2000 PTCA RCA 2/2 ISR; d. 10/1999 Inf MI/VF Arrest: 4 stents to RCA 2/2 ISR; e. 05/2001 Cath: ISR RCA, sev LAD dzs, EF 35-40%-->CABG . 09/2013 MV: Inferior infarct, no ischemia.  He is becoming symptomatic concerning fatigue and dyspnea with fluid retention. He has not had any ischemic testing in over 5 years. Will plan NM stress test with moderate to high probability for progression of CAD. He is to continue his current regimen.   2. ICM with HRrEF: Last echo demonstrated EF of 25% in 2017. He has a significant AoV murmur. I will check echo for changes in AoV and ascertain  current EF status for medical management. He is to take lasix 20 mg daily now and also be prescribed potassium 20 mEq daily with frequent ventricular ectopy seen on EKG. BMET and will be drawn today. After one week, he will take lasix QOD with potassium.  3. ICD in situ: Last checked 3 weeks ago with face to face with Dr. Lovena Le in November.   4. Hypercholesterolemia: Continue statin therapy. Goal of LDL < 70.  Current medicines are reviewed at length with the patient today.    Labs/ tests ordered today include: BMET, BNP, CBC. NM stress test.  Phill Myron. West Pugh, ANP, AACC   07/07/2018 12:22 PM    [facility] [place] [address] Office [phone] Fax [phone]

## 2018-07-07 NOTE — Patient Instructions (Signed)
Medication Instructions:  TAKE LASIX 20MG  DAILY FOR 1 WEEK THEN EVERY-OTHER-DAY WITH POTASSIUM  TAKE 2mEq ON DAYS THAT YOU TAKE LASIX  If you need a refill on your cardiac medications before your next appointment, please call your pharmacy.  Labwork: BMET,BNP AND CBC TODAY HERE IN OUR OFFICE AT [facility]  Take the provided lab slips with you to the lab for your blood draw.   Testing/Procedures: Echocardiogram(ASAP) - Your physician has requested that you have an echocardiogram. Echocardiography is a painless test that uses sound waves to create images of your heart. It provides your doctor with information about the size and shape of your heart and how well your heart's chambers and valves are working. This procedure takes approximately one hour. There are no restrictions for this procedure. This will be performed at our [facility] location - [address].  Your physician has requested that you have an Exercise Myoview. A cardiac stress test is a cardiological test that measures the heart's ability to respond to external stress in a controlled clinical environment. The stress response is induced by exercise (exercise-treadmill). For further information please visit [email or web address]. If you have questions or concerns about your appointment, you can call the Nuclear Lab at [phone].     Special Instructions: LETTER FOR JURY DUTY PROVIDED  Follow-Up: Your physician wants you to follow-up in: [place]   Thank you for choosing [facility]!!

## 2018-07-08 ENCOUNTER — Ambulatory Visit (HOSPITAL_COMMUNITY)
Admission: RE | Admit: 2018-07-08 | Discharge: 2018-07-08 | Disposition: A | Discharge status: Home / Self Care | Payer: Medicare Other | Source: Ambulatory Visit | Attending: Cardiology | Admitting: Cardiology

## 2018-07-08 DIAGNOSIS — Z79899 Other long term (current) drug therapy: Secondary | ICD-10-CM | POA: Insufficient documentation

## 2018-07-08 DIAGNOSIS — I2510 Atherosclerotic heart disease of native coronary artery without angina pectoris: Secondary | ICD-10-CM | POA: Insufficient documentation

## 2018-07-08 DIAGNOSIS — R0602 Shortness of breath: Secondary | ICD-10-CM | POA: Diagnosis not present

## 2018-07-08 LAB — CBC WITH DIFFERENTIAL/PLATELET
BASOS: 1 %
Basophils Absolute: 0 10*3/uL (ref 0.0–0.2)
EOS (ABSOLUTE): 0.2 10*3/uL (ref 0.0–0.4)
Eos: 5 %
Hematocrit: 42.9 % (ref 37.5–51.0)
Hemoglobin: 14.1 g/dL (ref 13.0–17.7)
IMMATURE GRANS (ABS): 0 10*3/uL (ref 0.0–0.1)
IMMATURE GRANULOCYTES: 1 %
LYMPHS: 27 %
Lymphocytes Absolute: 1.1 10*3/uL (ref 0.7–3.1)
MCH: 30.9 pg (ref 26.6–33.0)
MCHC: 32.9 g/dL (ref 31.5–35.7)
MCV: 94 fL (ref 79–97)
MONOS ABS: 0.5 10*3/uL (ref 0.1–0.9)
Monocytes: 12 %
NEUTROS PCT: 54 %
Neutrophils Absolute: 2.2 10*3/uL (ref 1.4–7.0)
Platelets: 177 10*3/uL (ref 150–450)
RBC: 4.57 x10E6/uL (ref 4.14–5.80)
RDW: 13.9 % (ref 12.3–15.4)
WBC: 4 10*3/uL (ref 3.4–10.8)

## 2018-07-08 LAB — BASIC METABOLIC PANEL
BUN/Creatinine Ratio: 12 (ref 10–24)
BUN: 11 mg/dL (ref 8–27)
CALCIUM: 9.2 mg/dL (ref 8.6–10.2)
CHLORIDE: 101 mmol/L (ref 96–106)
CO2: 23 mmol/L (ref 20–29)
Creatinine, Ser: 0.9 mg/dL (ref 0.76–1.27)
GFR calc non Af Amer: 85 mL/min/{1.73_m2} (ref >59)
GFR, EST AFRICAN AMERICAN: 98 mL/min/{1.73_m2} (ref >59)
Glucose: 102 mg/dL — ABNORMAL HIGH (ref 65–99)
POTASSIUM: 4.7 mmol/L (ref 3.5–5.2)
Sodium: 141 mmol/L (ref 134–144)

## 2018-07-08 LAB — BRAIN NATRIURETIC PEPTIDE: BNP: 3214.6 pg/mL — AB (ref 0.0–100.0)

## 2018-07-08 LAB — MYOCARDIAL PERFUSION IMAGING
CHL CUP NUCLEAR SDS: 4
CHL CUP NUCLEAR SSS: 29
CSEPPHR: 60 {beats}/min
NUC STRESS TID: 1.05
Rest HR: 56 {beats}/min
SRS: 25

## 2018-07-08 MED ORDER — TECHNETIUM TC 99M TETROFOSMIN IV KIT
9.9000 | PACK | Freq: Once | INTRAVENOUS | Status: AC | PRN
  Administered 2018-07-08: 9.9 via INTRAVENOUS
  Filled 2018-07-08: qty 10

## 2018-07-08 MED ORDER — TECHNETIUM TC 99M TETROFOSMIN IV KIT
31.9000 | PACK | Freq: Once | INTRAVENOUS | Status: AC | PRN
  Administered 2018-07-08: 31.9 via INTRAVENOUS
  Filled 2018-07-08: qty 32

## 2018-07-08 MED ORDER — REGADENOSON 0.4 MG/5ML IV SOLN
0.4000 mg | Freq: Once | INTRAVENOUS | Status: AC
  Administered 2018-07-08: 0.4 mg via INTRAVENOUS

## 2018-07-12 ENCOUNTER — Telehealth: Payer: Self-pay | Admitting: Adult Health

## 2018-07-12 NOTE — Telephone Encounter (Signed)
Pt called to report that he had received his echo results and is very anxious because he is going out of town 10/18-10/29... He needs to see Dr. Percival Spanish to discuss the results and his plan.. Advised him that I will talk with Dr. Cherlyn Cushing nurse and will see if we can get him in prior to his trip.

## 2018-07-12 NOTE — Telephone Encounter (Signed)
Pt agrees to see Dr. Percival Spanish on 10/11/9... To go over his echo results and talk about his plan prior to leaving town on his cruise 07/29/18.

## 2018-07-12 NOTE — Telephone Encounter (Signed)
New Message:      Pt is calling and would like to speak with Purcell Nails pertaining to some test he had performed on yesterday.

## 2018-07-21 NOTE — Progress Notes (Signed)
Cardiology Office Note   barely been never been no Date:  07/22/2018   ID:  Stephen Adams, DOB October 24, 1945, MRN [id number]  PCP:  Josetta Huddle, MD  Cardiologist:   Minus Breeding, MD    Chief Complaint  Patient presents with  . Shortness of Breath      History of Present Illness: Stephen Adams is a 72 y.o. male who presents  for evaluation of ischemic cardiomyopathy and CAD.  The patient had a myocardial infarction several years ago at the time of left shoulder surgery. Because he was just post surgery he did not get angioplasty. He had a resultant ischemic cardiomyopathy. He did later get bypass and then an ICD.  He saw Dr. Lovena Le because he had an alarm on his ICD that  was falsely reporting atrial fib.   At the last visit he was having fatigue and dyspnea.   Echo demonstrated an EF that was unchanged at 25%.  He had a [employer].  There is a large defect of severe severity present in the basal anterior, basal anteroseptal, basal inferoseptal, basal inferior, mid anterior, mid anteroseptal, mid inferoseptal, mid inferior, apical anterior, apical septal, apical inferior and apex location.  This was consistent with infarct not ischemia.  He also appeared to have moderate aortic stenosis.  Since he was started on diuretic he is lost about 15 pounds.  He feels much better than he did prior to starting this.  He is having less shortness of breath, PND or orthopnea.  He is not having palpitations, presyncope or syncope.  He is having much less edema than he did.  He is able to do walking and feels much more back to his baseline.  Past Medical History:  Diagnosis Date  . CAD (coronary artery disease)    a. 1989 PTCA LAD; b. 1999 BMS to LAD & RCA; c. 2000 PTCA RCA 2/2 ISR; d. 10/1999 Inf MI/VF Arrest: 4 stents to RCA 2/2 ISR; e. 05/2001 Cath: ISR RCA, sev LAD dzs, EF 35-40%-->CABG (details unknown); f. 09/2013 MV: Inferior infarct, no ischemia.  . Chronic systolic CHF (congestive heart  failure) ([place])    a. 02/2015 Echo: EF 25-30%.  . DJD (degenerative joint disease)   . Hyperlipidemia   . Hypertensive heart disease   . 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 10/2008 s/p MDT [employer] DR single lead AICD. Ser # [id number] H.  . Ischemic cardiomyopathy    a. 02/2015 Echo: EF 25-30%, inf, apical AK, mid-apical-anteroseptal and ant HK, Gr1 DD, mild AI, mod dil LA.  . Subluxation of interphalangeal joint of lesser toe   . Subluxation of metatarsophalangeal joint of lesser toe     Past Surgical History:  Procedure Laterality Date  . CORONARY ARTERY BYPASS GRAFT     4 vessel [employer].  Dr. Burns Spain 06/2000  . EP IMPLANTABLE DEVICE N/A 05/05/2016   Procedure: ICD Generator Changeout;  Surgeon: Evans Lance, MD;  Location: [facility];  Service: Cardiovascular;  Laterality: N/A;  . TOTAL KNEE ARTHROPLASTY Left   . TOTAL SHOULDER REPLACEMENT Bilateral      Current Outpatient Medications  Medication Sig Dispense Refill  . albuterol (PROVENTIL HFA;VENTOLIN HFA) 108 (90 Base) MCG/ACT inhaler Inhale 1-2 puffs into the lungs every 6 (six) hours as needed for wheezing or shortness of breath. 1 Inhaler 0  . aspirin 81 MG tablet Take 81 mg by mouth daily.    Marland Kitchen atorvastatin (LIPITOR) 20 MG tablet TAKE 1  TABLET BY MOUTH EVERY DAY 30 tablet 6  . carvedilol (COREG) 6.25 MG tablet Take 1 tablet (6.25 mg total) by mouth 2 (two) times daily. 180 tablet 1  . ENTRESTO 97-103 MG TAKE 1 TABLET BY MOUTH 2 (TWO) TIMES DAILY 60 tablet 5  . furosemide (LASIX) 20 MG tablet Take 1 tablet (20 mg total) by mouth daily. 90 tablet 3  . pantoprazole (PROTONIX) 40 MG tablet TAKE 1 TABLET BY MOUTH EVERY DAY 30 tablet 6  . potassium chloride SA (KLOR-CON M20) 20 MEQ tablet Take 1 tablet (20 mEq total) by mouth daily. 90 tablet 3   No current facility-administered medications for this visit.     Allergies:   Oxycodone-acetaminophen    ROS:  Please see the history of  present illness.   Otherwise, review of systems are positive for none.   All other systems are reviewed and negative.    PHYSICAL EXAM: VS:  BP 130/64   Pulse (!) 57   Ht 5\' 11"  (1.803 m)   Wt 200 lb (90.7 kg)   SpO2 97%   BMI 27.89 kg/m  , BMI Body mass index is 27.89 kg/m.  GENERAL:  Well appearing NECK:  No jugular venous distention, waveform within normal limits, carotid upstroke brisk and symmetric, no bruits, no thyromegaly LUNGS:  Clear to auscultation bilaterally CHEST:  Well healed sternotomy scar and ICD pocket.  HEART:  PMI not displaced or sustained,S1 and S2 within normal limits, no S3, no S4, no clicks, no rubs, 3 out of 6 systolic murmur radiating slightly at the aortic outflow tract, no diastolic murmurs ABD:  Flat, positive bowel sounds normal in frequency in pitch, no bruits, no rebound, no guarding, no midline pulsatile mass, no hepatomegaly, no splenomegaly EXT:  2 plus pulses throughout, trace edema edema, no cyanosis no clubbing     EKG:  EKG is not ordered today.    Recent Labs: 04/26/2018: ALT 15 07/07/2018: BNP 3,214.6; BUN 11; Creatinine, Ser 0.90; Hemoglobin 14.1; Platelets 177; Potassium 4.7; Sodium 141    Lipid Panel    Wt Readings from Last 3 Encounters:  07/22/18 200 lb (90.7 kg)  07/08/18 213 lb (96.6 kg)  07/07/18 213 lb (96.6 kg)      Other studies Reviewed: Additional studies/ records that were reviewed today include:  Echo Review of the above records demonstrates:       ASSESSMENT AND PLAN:   Ischemic cardiomyopathy EF was 25 - 30% on echo last month.   This was unchanged from previous.  He does appear to have more aortic stenosis and was previously suggested in a murmur that is more prominent.  I will follow-up with an echo in 6 months.  However, at this point with diuretics he is not symptomatic or at least is back to his baseline.  We talked about this at length.  I will check a basic metabolic profile today but he is going to  continue on current meds including the diuretic.  We talked about salt and fluid restriction.  AS This was moderate on recent echo.  I will follow this clinically.  And as above.  Hx of CABG Perfusion study demonstrated a pervious infarct.  He is not having any angina.  If he has progressive symptoms despite the diuretic I would start with a right and left heart catheterization however as it would be difficult to interpret other obstructive coronary disease in the face of his old apical infarct.   ICD (implantable cardioverter-defibrillator)  The patient is up to date with ICD follow up.  I reviewed these records for this visit.    Hyperlipidemia LDL recently was 81.   He will remain on meds although if he is not at a goal of 70 or lower at the next lab draw I will increase the statin.   AAA 3.2 cm.  **   X 3.1.   He is scheduled to have follow up of this in July.     Current medicines are reviewed at length with the patient today.  The patient does not have concerns regarding medicines.  The following changes have been made:  None  Labs/ tests ordered today include:     Orders Placed This Encounter  Procedures  . Basic Metabolic Panel (BMET)     Disposition:   FU with me in one month.     Signed, Minus Breeding, MD  07/22/2018 12:54 PM    [facility]

## 2018-07-22 ENCOUNTER — Ambulatory Visit (INDEPENDENT_AMBULATORY_CARE_PROVIDER_SITE_OTHER): Payer: Medicare Other | Admitting: Cardiology

## 2018-07-22 ENCOUNTER — Encounter: Payer: Self-pay | Admitting: Cardiology

## 2018-07-22 VITALS — BP 130/64 | HR 57 | Ht 71.0 in | Wt 200.0 lb

## 2018-07-22 DIAGNOSIS — I2510 Atherosclerotic heart disease of native coronary artery without angina pectoris: Secondary | ICD-10-CM | POA: Diagnosis not present

## 2018-07-22 DIAGNOSIS — I255 Ischemic cardiomyopathy: Secondary | ICD-10-CM

## 2018-07-22 DIAGNOSIS — Z0001 Encounter for general adult medical examination with abnormal findings: Secondary | ICD-10-CM | POA: Insufficient documentation

## 2018-07-22 DIAGNOSIS — M1990 Unspecified osteoarthritis, unspecified site: Secondary | ICD-10-CM | POA: Insufficient documentation

## 2018-07-22 DIAGNOSIS — I10 Essential (primary) hypertension: Secondary | ICD-10-CM | POA: Insufficient documentation

## 2018-07-22 DIAGNOSIS — I714 Abdominal aortic aneurysm, without rupture, unspecified: Secondary | ICD-10-CM

## 2018-07-22 DIAGNOSIS — I350 Nonrheumatic aortic (valve) stenosis: Secondary | ICD-10-CM

## 2018-07-22 DIAGNOSIS — Z79899 Other long term (current) drug therapy: Secondary | ICD-10-CM | POA: Diagnosis not present

## 2018-07-22 DIAGNOSIS — E785 Hyperlipidemia, unspecified: Secondary | ICD-10-CM | POA: Insufficient documentation

## 2018-07-22 DIAGNOSIS — J189 Pneumonia, unspecified organism: Secondary | ICD-10-CM | POA: Insufficient documentation

## 2018-07-22 DIAGNOSIS — E7800 Pure hypercholesterolemia, unspecified: Secondary | ICD-10-CM | POA: Insufficient documentation

## 2018-07-22 DIAGNOSIS — R899 Unspecified abnormal finding in specimens from other organs, systems and tissues: Secondary | ICD-10-CM | POA: Insufficient documentation

## 2018-07-22 DIAGNOSIS — Z0000 Encounter for general adult medical examination without abnormal findings: Secondary | ICD-10-CM | POA: Insufficient documentation

## 2018-07-22 DIAGNOSIS — E559 Vitamin D deficiency, unspecified: Secondary | ICD-10-CM | POA: Insufficient documentation

## 2018-07-22 DIAGNOSIS — I5023 Acute on chronic systolic (congestive) heart failure: Secondary | ICD-10-CM | POA: Diagnosis not present

## 2018-07-22 MED ORDER — POTASSIUM CHLORIDE CRYS ER 20 MEQ PO TBCR: 20.0000 meq | EXTENDED_RELEASE_TABLET | Freq: Every day | ORAL | 3 refills | Status: DC

## 2018-07-22 MED ORDER — FUROSEMIDE 20 MG PO TABS: 20.0000 mg | ORAL_TABLET | Freq: Every day | ORAL | 3 refills | Status: DC

## 2018-07-22 NOTE — Patient Instructions (Signed)
Medication Instructions:  Continue current medications  If you need a refill on your cardiac medications before your next appointment, please call your pharmacy.  Labwork: BMP Today HERE IN OUR OFFICE AT [facility]  Take the provided lab slips with you to the lab for your blood draw.    You will NOT need to fast   If you have labs (blood work) drawn today and your tests are completely normal, you will receive your results only by: Marland Kitchen MyChart Message (if you have MyChart) OR . A paper copy in the mail If you have any lab test that is abnormal or we need to change your treatment, we will call you to review the results.  Testing/Procedures: None Ordered  Follow-Up: You will need a follow up appointment in 1 Month with Jory Sims.   You may see  DR Percival Spanish or one of the following Advanced Practice Providers on your designated Care Team:   . Rosaria Ferries, PA-C . Jory Sims, DNP, ANP  At [facility], you and your health needs are our priority.  As part of our continuing mission to provide you with exceptional heart care, we have created designated Provider Care Teams.  These Care Teams include your primary Cardiologist (physician) and Advanced Practice Providers (APPs -  Physician Assistants and Nurse Practitioners) who all work together to provide you with the care you need, when you need it.   Thank you for choosing [facility] at [facility]!!

## 2018-07-23 LAB — BASIC METABOLIC PANEL
BUN/Creatinine Ratio: 16 (ref 10–24)
BUN: 14 mg/dL (ref 8–27)
CALCIUM: 9.2 mg/dL (ref 8.6–10.2)
CHLORIDE: 98 mmol/L (ref 96–106)
CO2: 25 mmol/L (ref 20–29)
Creatinine, Ser: 0.87 mg/dL (ref 0.76–1.27)
GFR calc non Af Amer: 86 mL/min/{1.73_m2} (ref >59)
GFR, EST AFRICAN AMERICAN: 100 mL/min/{1.73_m2} (ref >59)
Glucose: 92 mg/dL (ref 65–99)
Potassium: 5.1 mmol/L (ref 3.5–5.2)
Sodium: 138 mmol/L (ref 134–144)

## 2018-07-26 DIAGNOSIS — Z08 Encounter for follow-up examination after completed treatment for malignant neoplasm: Secondary | ICD-10-CM | POA: Diagnosis not present

## 2018-07-26 DIAGNOSIS — Z85820 Personal history of malignant melanoma of skin: Secondary | ICD-10-CM | POA: Diagnosis not present

## 2018-07-26 DIAGNOSIS — D225 Melanocytic nevi of trunk: Secondary | ICD-10-CM | POA: Diagnosis not present

## 2018-07-26 DIAGNOSIS — Z1283 Encounter for screening for malignant neoplasm of skin: Secondary | ICD-10-CM | POA: Diagnosis not present

## 2018-07-30 ENCOUNTER — Other Ambulatory Visit: Payer: Self-pay | Admitting: Adult Health

## 2018-08-14 ENCOUNTER — Other Ambulatory Visit: Payer: Self-pay | Admitting: Cardiology

## 2018-08-14 NOTE — Progress Notes (Signed)
Cardiology Office Note Date:  08/17/2018  Patient ID:  Stephen, Adams 19-Oct-1945, MRN [id number] PCP:  Josetta Huddle, MD  Cardiologist:  Dr. Percival Spanish Electrophysiologist: Dr. Lovena Le   Chief Complaint: annual EP visit  History of Present Illness: Stephen Adams is a 72 y.o. male with history of CAD, (CABG), ICM w/ICD, HTN, HLD, AAA and chronic venous insufficiency followed by Dr. Bridgett Larsson.  He comes today to be seen for Dr. Jannet Askew, last seen by him Sept 2018.  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  Mentioned if progressed conduction disease, would need to stop his coreg (given single chamber device). Most recently saw Dr. Percival Spanish last month, doing well, no changes were made to his tx.  He feels well.  Reports himself as quite active without exertional incapacities, back at baseline since being on the lasix.  Able to walk hills again comfortably, no symptoms of PND. No CP, palpitations, no dizziness, near syncope or syncope.  No shocks.  Device information: MDT single chamber ICD, implanted 2010, gen change 05/05/16.  Past Medical History:  Diagnosis Date  . CAD (coronary artery disease)    a. 1989 PTCA LAD; b. 1999 BMS to LAD & RCA; c. 2000 PTCA RCA 2/2 ISR; d. 10/1999 Inf MI/VF Arrest: 4 stents to RCA 2/2 ISR; e. 05/2001 Cath: ISR RCA, sev LAD dzs, EF 35-40%-->CABG (details unknown); f. 09/2013 MV: Inferior infarct, no ischemia.  . Chronic systolic CHF (congestive heart failure) ([place])    a. 02/2015 Echo: EF 25-30%.  . DJD (degenerative joint disease)   . Hyperlipidemia   . Hypertensive heart disease   . 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 10/2008 s/p MDT [employer] single lead AICD. Ser # [id number] H.  . Ischemic cardiomyopathy    a. 02/2015 Echo: EF 25-30%, inf, apical AK, mid-apical-anteroseptal and ant HK, Gr1 DD, mild AI, mod  dil LA.  . Subluxation of interphalangeal joint of lesser toe   . Subluxation of metatarsophalangeal joint of lesser toe     Past Surgical History:  Procedure Laterality Date  . CORONARY ARTERY BYPASS GRAFT     4 vessel [employer] [facility].  Dr. Burns Spain 06/2000  . EP IMPLANTABLE DEVICE N/A 05/05/2016   Procedure: ICD Generator Changeout;  Surgeon: Evans Lance, MD;  Location: [facility];  Service: Cardiovascular;  Laterality: N/A;  . TOTAL KNEE ARTHROPLASTY Left   . TOTAL SHOULDER REPLACEMENT Bilateral     Current Outpatient Medications  Medication Sig Dispense Refill  . aspirin 81 MG tablet Take 81 mg by mouth daily.    Marland Kitchen atorvastatin (LIPITOR) 20 MG tablet TAKE 1 TABLET BY MOUTH EVERY DAY 30 tablet 10  . carvedilol (COREG) 6.25 MG tablet Take 1 tablet (6.25 mg total) by mouth 2 (two) times daily. 180 tablet 1  . ENTRESTO 97-103 MG TAKE 1 TABLET BY MOUTH 2 (TWO) TIMES DAILY 60 tablet 8  . furosemide (LASIX) 20 MG tablet TAKE ONE TABLT DAILY FOR 1 WEEK THEN EVERY OTHER DAY 15 tablet 6  . potassium chloride SA (KLOR-CON M20) 20 MEQ tablet Take 1 tablet (20 mEq total) by mouth daily. 90 tablet 3   No current facility-administered medications for this visit.     Allergies:   Oxycodone-acetaminophen   Social History:  The patient  reports that he has never smoked. He has never used smokeless tobacco. He reports that  he drinks alcohol. He reports that he does not use drugs.   Family History:  The patient's family history includes CAD in his mother; Heart Problems in his father and maternal grandfather; Hypertension in his father; Pancreatic cancer in his mother.  ROS:  Please see the history of present illness. Otherwise, review of systems is positive for   All other systems are reviewed and otherwise negative.   PHYSICAL EXAM:  VS:  BP 110/74   Pulse (!) 104   Ht 5\' 11"  (1.803 m)   Wt 205 lb (93 kg)   BMI 28.59 kg/m  BMI: Body mass index is 28.59 kg/m. Well nourished, well  developed, in no acute distress  HEENT: normocephalic, atraumatic  Neck: no JVD, carotid bruits or masses Cardiac:  RRR, extrasystoles are appreciated; no significant murmurs, no rubs, or gallops Lungs:  CTA b/l, no wheezing, rhonchi or rales  Abd: soft, nontender MS: no deformity or atrophy Ext: trace edema, wearing support stockings Skin: warm and dry, no rash Neuro:  No gross deficits appreciated Psych: euthymic mood, full affect  ICD site is stable, no tethering or discomfort   EKG:  Done 07/07/18, reviewed by myself:  V paced beats, intrinsic beats vs PVCs  01/28/18 SR, 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 Systolic   function was severely reduced. The estimated ejection fraction   was in the range of 25% to 30%. Severe hypokinesis and scarring   of the entireinferolateral, inferior, and inferoseptal   myocardium; consistent with infarction in the distribution of the   right coronary or left circumflex coronary artery. The study is   not technically sufficient to allow evaluation of LV diastolic   function due to incessant ventricular bigeminy.. No evidence of   thrombus. - Aortic valve: Right coronary and noncoronary cusp mobility was   moderately restricted. Transvalvular velocity was increased less   than expected, due to low cardiac output. There was moderate   stenosis. There was trivial regurgitation. Valve area (VTI): 1.35   cm^2. Valve area (Vmax): 1.02 cm^2. Valve area (Vmean): 1.22   cm^2. - Mitral valve: Calcified annulus. Mildly thickened leaflets .   There was mild regurgitation. - Left atrium: The atrium was severely dilated. - Right ventricle: The cavity size was moderately to severely   dilated. Systolic function was moderately  reduced. - Right atrium: The atrium was severely dilated.  07/08/18: stress myoview  There was no ST segment deviation noted during stress.  Defect 1: There is a large defect of severe severity present in the basal anterior, basal anteroseptal, basal inferoseptal, basal inferior, mid anterior, mid anteroseptal, mid inferoseptal, mid inferior, apical anterior, apical septal, apical inferior and apex location.  Findings consistent with prior myocardial infarction of the apex, anteroapical , entire inferior walls.  This is an intermediate risk study based on the LV dysfunction. There is no evidence of ischemia.  The study was not able to be gated. LV function information is not available    Recent Labs: 04/26/2018: ALT 15 07/07/2018: BNP 3,214.6; Hemoglobin 14.1; Platelets 177 07/22/2018: BUN 14; Creatinine, Ser 0.87; Potassium 5.1; Sodium 138  04/26/2018: Cholesterol 119; HDL 28; LDL Cholesterol 81; Total CHOL/HDL Ratio 4.3; Triglycerides 52; VLDL 10   CrCl cannot be calculated (Patient's most recent lab  result is older than the maximum 21 days allowed.).   Wt Readings from Last 3 Encounters:  08/17/18 205 lb (93 kg)  07/22/18 200 lb (90.7 kg)  07/08/18 213 lb (96.6 kg)     Other studies reviewed: Additional studies/records reviewed today include: summarized above  ASSESSMENT AND PLAN:  1. ICD      Intact function  2. Conduction system disease     His pacing % is up further     EKG done recently V pacing without clear regular sinus input   RV pacing % is increasing, seems w/worsening conduction system disease. Will reduce is coreg to 3.125mg  BID in effort to reduce need for RV pacing.  He has known PVCs also, hopefully these won't increase I will discuss with Dr. Lovena Le Current device is brand new with 9 years of battery life, would like to avoid another procedure (adding an A lead) if possible, might need to stop coreg completely.  3. ICM, chronic CHF (systolic)     No  symptoms or exam findings to suggest fluid OL, OptiVol looks good     OnBB, Entresto, diuretic  4. CAD     No anginal sounding symptoms     On ASA, BB, statin tx  5. HTN     Looks good     Disposition: F/u with Arnold Long later this month as scheduled. Suggest  an EKG at that visit to see if any improvement in his conduction.  We will keep Q 3 month remotes, and EP in a year, sooner if needed.   Current medicines are reviewed at length with the patient today.  The patient did not have any concerns regarding medicines.  Venetia Night, PA-C 08/17/2018 1:49 PM     [place] [place] [address] [phone] (office)  [phone] (fax)

## 2018-08-15 ENCOUNTER — Ambulatory Visit: Payer: Self-pay | Admitting: Cardiology

## 2018-08-17 ENCOUNTER — Ambulatory Visit (INDEPENDENT_AMBULATORY_CARE_PROVIDER_SITE_OTHER): Payer: Medicare Other | Admitting: Physician Assistant

## 2018-08-17 VITALS — BP 110/74 | HR 104 | Ht 71.0 in | Wt 205.0 lb

## 2018-08-17 DIAGNOSIS — I5022 Chronic systolic (congestive) heart failure: Secondary | ICD-10-CM

## 2018-08-17 DIAGNOSIS — I447 Left bundle-branch block, unspecified: Secondary | ICD-10-CM

## 2018-08-17 DIAGNOSIS — Z95810 Presence of automatic (implantable) cardiac defibrillator: Secondary | ICD-10-CM

## 2018-08-17 DIAGNOSIS — I440 Atrioventricular block, first degree: Secondary | ICD-10-CM

## 2018-08-17 DIAGNOSIS — I255 Ischemic cardiomyopathy: Secondary | ICD-10-CM | POA: Diagnosis not present

## 2018-08-17 MED ORDER — CARVEDILOL 6.25 MG PO TABS: 3.1250 mg | ORAL_TABLET | Freq: Two times a day (BID) | ORAL | 11 refills | Status: DC

## 2018-08-17 NOTE — Patient Instructions (Addendum)
Medication Instructions:   START TAKING  3.125 HALF OF 6.25 MG TWICE A DAY    If you need a refill on your cardiac medications before your next appointment, please call your pharmacy.   Labwork: NONE ORDERED  TODAY     Testing/Procedures: NONE ORDERED  TODAY     Follow-Up: DR [facility]  NEXT AVAILABLE    Your physician wants you to follow-up in: [place] will receive a reminder letter in the mail two months in advance. If you don't receive a letter, please call our office to schedule the follow-up appointment.  Remote monitoring is used to monitor your Pacemaker of ICD from home. This monitoring reduces the number of office visits required to check your device to one time per year. It allows [place] to keep an eye on the functioning of your device to ensure it is working properly. You are scheduled for a device check from home on . 08-24-18 You may send your transmission at any time that day. If you have a wireless device, the transmission will be sent automatically. After your physician reviews your transmission, you will receive a postcard with your next transmission date.     Any Other Special Instructions Will Be Listed Below (If Applicable).

## 2018-08-24 ENCOUNTER — Ambulatory Visit (INDEPENDENT_AMBULATORY_CARE_PROVIDER_SITE_OTHER): Payer: Medicare Other | Admitting: *Deleted

## 2018-08-24 DIAGNOSIS — I5022 Chronic systolic (congestive) heart failure: Secondary | ICD-10-CM

## 2018-08-24 DIAGNOSIS — I255 Ischemic cardiomyopathy: Secondary | ICD-10-CM | POA: Diagnosis not present

## 2018-08-24 NOTE — Progress Notes (Signed)
Remote ICD transmission.   

## 2018-08-24 NOTE — Progress Notes (Signed)
Cardiology Office Note   Date:  08/25/2018   ID:  Stephen Adams, DOB 1946-09-11, MRN [id number]  PCP:  Josetta Huddle, MD  Cardiologist: [facility]   Chief Complaint  Patient presents with  . Hypertension  . Cardiomyopathy  . Coronary Artery Disease     History of Present Illness: Stephen Adams is a 72 y.o. male who presents for ongoing assessment and management of CAD (hx of CABG), ischemic cardiomyopathy with ICD in situ, hypertension, HLD, AAA, and chronic renal insufficiency.   He was seen by Jennings Books, PA on 08/17/2018, ICD was interrogated with function intact, RV pacing was found to be increased with worsening conduction system disease. Coreg was reduced to 3.125 mg BID, in effort to reduce need for RV pacing.OpitVol was normal without evidence of fluid overload. He was continued on Entresto and diuretic.     He comes today without new complaints. He is taking lasix daily now as this is keeping his weight and breathing status stable. He is traveling with his wife to visit grandchildren in [place] state. He denies any worsening dyspnea, ICD therapy, dizziness or chest discomfort.   Past Medical History:  Diagnosis Date  . CAD (coronary artery disease)    a. 1989 PTCA LAD; b. 1999 BMS to LAD & RCA; c. 2000 PTCA RCA 2/2 ISR; d. 10/1999 Inf MI/VF Arrest: 4 stents to RCA 2/2 ISR; e. 05/2001 Cath: ISR RCA, sev LAD dzs, EF 35-40%-->CABG (details unknown); f. 09/2013 MV: Inferior infarct, no ischemia.  . Chronic systolic CHF (congestive heart failure) ([place])    a. 02/2015 Echo: EF 25-30%.  . DJD (degenerative joint disease)   . Hyperlipidemia   . Hypertensive heart disease   . 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 10/2008 s/p MDT [employer] DR single lead AICD. Ser # [id number] H.  . Ischemic cardiomyopathy    a. 02/2015 Echo: EF 25-30%, inf, apical AK, mid-apical-anteroseptal and ant HK, Gr1 DD, mild AI, mod dil LA.  . Subluxation of interphalangeal joint of  lesser toe   . Subluxation of metatarsophalangeal joint of lesser toe     Past Surgical History:  Procedure Laterality Date  . CORONARY ARTERY BYPASS GRAFT     4 vessel [employer].  Dr. Burns Spain 06/2000  . EP IMPLANTABLE DEVICE N/A 05/05/2016   Procedure: ICD Generator Changeout;  Surgeon: Evans Lance, MD;  Location: [place] [facility];  Service: Cardiovascular;  Laterality: N/A;  . TOTAL KNEE ARTHROPLASTY Left   . TOTAL SHOULDER REPLACEMENT Bilateral      Current Outpatient Medications  Medication Sig Dispense Refill  . aspirin 81 MG tablet Take 81 mg by mouth daily.    Marland Kitchen atorvastatin (LIPITOR) 20 MG tablet TAKE 1 TABLET BY MOUTH EVERY DAY 30 tablet 10  . carvedilol (COREG) 6.25 MG tablet Take 0.5 tablets (3.125 mg total) by mouth 2 (two) times daily. 60 tablet 11  . ENTRESTO 97-103 MG TAKE 1 TABLET BY MOUTH 2 (TWO) TIMES DAILY 60 tablet 8  . furosemide (LASIX) 20 MG tablet Take 1 tablet (20 mg total) by mouth daily. [place] for #90 days if insurance is better covered 30 tablet 12  . potassium chloride SA (KLOR-CON M20) 20 MEQ tablet Take 1 tablet (20 mEq total) by mouth daily. 90 tablet 3   No current facility-administered medications for this visit.     Allergies:   Oxycodone-acetaminophen    Social History:  The patient  reports that he has never smoked. He has  never used smokeless tobacco. He reports that he drinks alcohol. He reports that he does not use drugs.   Family History:  The patient's family history includes CAD in his mother; Heart Problems in his father and maternal grandfather; Hypertension in his father; Pancreatic cancer in his mother.    ROS: All other systems are reviewed and negative. Unless otherwise mentioned in H&P    PHYSICAL EXAM: VS:  BP 122/72   Pulse (!) 50   Ht 5\' 11"  (1.803 m)   Wt 203 lb 9.6 oz (92.4 kg)   BMI 28.40 kg/m  , BMI Body mass index is 28.4 kg/m. GEN: Well nourished, well developed, in no acute distress HEENT: normal Neck: no  JVD, carotid bruits, or masses Cardiac: IRRR; frequent extra systole, 1/6  murmur, rubs, or gallops,no edema  Respiratory:  Clear to auscultation bilaterally, normal work of breathing GI: soft, nontender, nondistended, + BS MS: no deformity or atrophy Skin: warm and dry, no rash Neuro:  Strength and sensation are intact Psych: euthymic mood, full affect   EKG:  SR with LBBB with ventricular pacing. Rate of 50 bpm   Recent Labs: 04/26/2018: ALT 15 07/23/2018: BNP 3,214.6; Hemoglobin 14.1; Platelets 177 07/22/2018: BUN 14; Creatinine, Ser 0.87; Potassium 5.1; Sodium 138    Lipid Panel    Component Value Date/Time   CHOL 119 04/26/2018 0919   TRIG 52 04/26/2018 0919   HDL 28 (L) 04/26/2018 0919   CHOLHDL 4.3 04/26/2018 0919   VLDL 10 04/26/2018 0919   LDLCALC 81 04/26/2018 0919      Wt Readings from Last 3 Encounters:  08/25/18 203 lb 9.6 oz (92.4 kg)  08/17/18 205 lb (93 kg)  07/22/18 200 lb (90.7 kg)    Other studies Reviewed: Echocardiogram 23-Jul-2018  Left ventricle: The cavity size was mildly dilated. Systolic   function was severely reduced. The estimated ejection fraction   was in the range of 25% to 30%. Severe hypokinesis and scarring   of the entireinferolateral, inferior, and inferoseptal   myocardium; consistent with infarction in the distribution of the   right coronary or left circumflex coronary artery. The study is   not technically sufficient to allow evaluation of LV diastolic   function due to incessant ventricular bigeminy.. No evidence of   thrombus. - Aortic valve: Right coronary and noncoronary cusp mobility was   moderately restricted. Transvalvular velocity was increased less   than expected, due to low cardiac output. There was moderate   stenosis. There was trivial regurgitation. Valve area (VTI): 1.35   cm^2. Valve area (Vmax): 1.02 cm^2. Valve area (Vmean): 1.22   cm^2. - Mitral valve: Calcified annulus. Mildly thickened leaflets .   There  was mild regurgitation. - Left atrium: The atrium was severely dilated. - Right ventricle: The cavity size was moderately to severely   dilated. Systolic function was moderately reduced. - Right atrium: The atrium was severely dilated.   ASSESSMENT AND PLAN:  1. CAD: Hx of CABG. He is without cardiac complaints concerning chest pain. He is active, traveling and denies fatigue. No changes in his cardiac medications or need for ischemic testing at this time  2, ICM: Most recent echocardiogram EF of 25%-30% 07/23/18. He remains on Entresto, lasix daily now, and lower dose of carvedilol per EP to prevent RV pacing burden. He feels well and has no dyspnea. I will continue same regimen. He will need follow up BMET in 3 months.   3. ICD in situ: He has  seen EP within the last week. Coreg was reduced.    Current medicines are reviewed at length with the patient today.    Labs/ tests ordered today include: None  Phill Myron. West Pugh, ANP, [facility]   08/25/2018 10:52 AM    [place] [address] Office [phone] Fax [phone]

## 2018-08-25 ENCOUNTER — Encounter: Payer: Self-pay | Admitting: Adult Health

## 2018-08-25 ENCOUNTER — Ambulatory Visit (INDEPENDENT_AMBULATORY_CARE_PROVIDER_SITE_OTHER): Payer: Medicare Other | Admitting: Adult Health

## 2018-08-25 VITALS — BP 122/72 | HR 50 | Ht 71.0 in | Wt 203.6 lb

## 2018-08-25 DIAGNOSIS — I255 Ischemic cardiomyopathy: Secondary | ICD-10-CM | POA: Diagnosis not present

## 2018-08-25 DIAGNOSIS — I43 Cardiomyopathy in diseases classified elsewhere: Secondary | ICD-10-CM

## 2018-08-25 DIAGNOSIS — I447 Left bundle-branch block, unspecified: Secondary | ICD-10-CM | POA: Diagnosis not present

## 2018-08-25 DIAGNOSIS — I2510 Atherosclerotic heart disease of native coronary artery without angina pectoris: Secondary | ICD-10-CM

## 2018-08-25 DIAGNOSIS — I10 Essential (primary) hypertension: Secondary | ICD-10-CM

## 2018-08-25 MED ORDER — FUROSEMIDE 20 MG PO TABS: 20.0000 mg | ORAL_TABLET | Freq: Every day | ORAL | 12 refills | Status: DC

## 2018-08-25 NOTE — Patient Instructions (Signed)
Follow-Up: You will need a follow up appointment in 6 MONTHS.  Please call our office 2 months in advance(MAR 2020) to schedule the (MAY 2020) appointment.  You may see  DR Hans Eden, DNP, ANP -or- one of the following Advanced Practice Providers on your designated Care Team:    . Rosaria Ferries, PA-C . Jory Sims, DNP, ANP  Medication Instructions:  MAKE SURE TO TAKE LASIX DAILY  If you need a refill on your cardiac medications before your next appointment, please call your pharmacy.  Labwork: If you have labs (blood work) drawn today and your tests are completely normal, you will receive your results ONLY by: . MyChart Message (if you have MyChart) -OR- . A paper copy in the mail  At [facility], you and your health needs are our priority.  As part of our continuing mission to provide you with exceptional heart care, we have created designated Provider Care Teams.  These Care Teams include your primary Cardiologist (physician) and Advanced Practice Providers (APPs -  Physician Assistants and Nurse Practitioners) who all work together to provide you with the care you need, when you need it.  Thank you for choosing [facility]!!

## 2018-10-23 LAB — CUP PACEART REMOTE DEVICE CHECK
Battery Voltage: 3 V
Brady Statistic RV Percent Paced: 30.57 %
Date Time Interrogation Session: 20191113051605
HighPow Impedance: 39 Ohm
HighPow Impedance: 49 Ohm
Implantable Lead Implant Date: 20100104
Implantable Lead Location: 753860
Implantable Lead Model: 6947
Lead Channel Impedance Value: 304 Ohm
Lead Channel Impedance Value: 361 Ohm
Lead Channel Pacing Threshold Amplitude: 1.25 V
Lead Channel Pacing Threshold Pulse Width: 0.4 ms
Lead Channel Sensing Intrinsic Amplitude: 9.875 mV
Lead Channel Setting Pacing Amplitude: 2.5 V
Lead Channel Setting Sensing Sensitivity: 0.3 mV
MDC IDC MSMT BATTERY REMAINING LONGEVITY: 120 mo
MDC IDC MSMT LEADCHNL RV SENSING INTR AMPL: 9.875 mV
MDC IDC PG IMPLANT DT: 20170725
MDC IDC SET LEADCHNL RV PACING PULSEWIDTH: 0.4 ms

## 2018-10-26 DIAGNOSIS — D225 Melanocytic nevi of trunk: Secondary | ICD-10-CM | POA: Diagnosis not present

## 2018-10-26 DIAGNOSIS — Z1283 Encounter for screening for malignant neoplasm of skin: Secondary | ICD-10-CM | POA: Diagnosis not present

## 2018-10-26 DIAGNOSIS — Z08 Encounter for follow-up examination after completed treatment for malignant neoplasm: Secondary | ICD-10-CM | POA: Diagnosis not present

## 2018-10-26 DIAGNOSIS — Z85820 Personal history of malignant melanoma of skin: Secondary | ICD-10-CM | POA: Diagnosis not present

## 2018-11-22 ENCOUNTER — Inpatient Hospital Stay (HOSPITAL_COMMUNITY)
Admission: EM | Admit: 2018-11-22 | Discharge: 2018-11-26 | DRG: 246 | Disposition: A | Discharge status: Home / Self Care | Payer: Medicare Other | Attending: Cardiology | Admitting: Cardiology

## 2018-11-22 ENCOUNTER — Other Ambulatory Visit: Payer: Self-pay

## 2018-11-22 ENCOUNTER — Emergency Department (HOSPITAL_COMMUNITY): Payer: Medicare Other

## 2018-11-22 ENCOUNTER — Encounter (HOSPITAL_COMMUNITY): Payer: Self-pay | Admitting: Emergency Medicine

## 2018-11-22 DIAGNOSIS — Z96611 Presence of right artificial shoulder joint: Secondary | ICD-10-CM | POA: Diagnosis present

## 2018-11-22 DIAGNOSIS — I214 Non-ST elevation (NSTEMI) myocardial infarction: Secondary | ICD-10-CM | POA: Diagnosis present

## 2018-11-22 DIAGNOSIS — E785 Hyperlipidemia, unspecified: Secondary | ICD-10-CM | POA: Diagnosis not present

## 2018-11-22 DIAGNOSIS — Z950 Presence of cardiac pacemaker: Secondary | ICD-10-CM

## 2018-11-22 DIAGNOSIS — I5023 Acute on chronic systolic (congestive) heart failure: Secondary | ICD-10-CM | POA: Diagnosis present

## 2018-11-22 DIAGNOSIS — I714 Abdominal aortic aneurysm, without rupture: Secondary | ICD-10-CM | POA: Diagnosis present

## 2018-11-22 DIAGNOSIS — Z96612 Presence of left artificial shoulder joint: Secondary | ICD-10-CM | POA: Diagnosis present

## 2018-11-22 DIAGNOSIS — Z886 Allergy status to analgesic agent status: Secondary | ICD-10-CM | POA: Diagnosis not present

## 2018-11-22 DIAGNOSIS — Z95810 Presence of automatic (implantable) cardiac defibrillator: Secondary | ICD-10-CM | POA: Diagnosis present

## 2018-11-22 DIAGNOSIS — R609 Edema, unspecified: Secondary | ICD-10-CM

## 2018-11-22 DIAGNOSIS — I340 Nonrheumatic mitral (valve) insufficiency: Secondary | ICD-10-CM | POA: Diagnosis present

## 2018-11-22 DIAGNOSIS — R079 Chest pain, unspecified: Secondary | ICD-10-CM | POA: Diagnosis not present

## 2018-11-22 DIAGNOSIS — E7800 Pure hypercholesterolemia, unspecified: Secondary | ICD-10-CM | POA: Diagnosis present

## 2018-11-22 DIAGNOSIS — I255 Ischemic cardiomyopathy: Secondary | ICD-10-CM | POA: Diagnosis present

## 2018-11-22 DIAGNOSIS — Z951 Presence of aortocoronary bypass graft: Secondary | ICD-10-CM

## 2018-11-22 DIAGNOSIS — I5022 Chronic systolic (congestive) heart failure: Secondary | ICD-10-CM | POA: Diagnosis present

## 2018-11-22 DIAGNOSIS — I25810 Atherosclerosis of coronary artery bypass graft(s) without angina pectoris: Secondary | ICD-10-CM | POA: Diagnosis not present

## 2018-11-22 DIAGNOSIS — Z96652 Presence of left artificial knee joint: Secondary | ICD-10-CM | POA: Diagnosis present

## 2018-11-22 DIAGNOSIS — I4891 Unspecified atrial fibrillation: Secondary | ICD-10-CM

## 2018-11-22 DIAGNOSIS — I472 Ventricular tachycardia, unspecified: Secondary | ICD-10-CM

## 2018-11-22 DIAGNOSIS — Z8674 Personal history of sudden cardiac arrest: Secondary | ICD-10-CM

## 2018-11-22 DIAGNOSIS — Z800 Family history of malignant neoplasm of digestive organs: Secondary | ICD-10-CM

## 2018-11-22 DIAGNOSIS — I2510 Atherosclerotic heart disease of native coronary artery without angina pectoris: Secondary | ICD-10-CM | POA: Diagnosis not present

## 2018-11-22 DIAGNOSIS — I25118 Atherosclerotic heart disease of native coronary artery with other forms of angina pectoris: Secondary | ICD-10-CM | POA: Diagnosis not present

## 2018-11-22 DIAGNOSIS — I110 Hypertensive heart disease with heart failure: Secondary | ICD-10-CM | POA: Diagnosis present

## 2018-11-22 DIAGNOSIS — Z8249 Family history of ischemic heart disease and other diseases of the circulatory system: Secondary | ICD-10-CM

## 2018-11-22 DIAGNOSIS — I272 Pulmonary hypertension, unspecified: Secondary | ICD-10-CM | POA: Diagnosis present

## 2018-11-22 DIAGNOSIS — Z7982 Long term (current) use of aspirin: Secondary | ICD-10-CM | POA: Diagnosis not present

## 2018-11-22 DIAGNOSIS — R0989 Other specified symptoms and signs involving the circulatory and respiratory systems: Secondary | ICD-10-CM | POA: Diagnosis not present

## 2018-11-22 DIAGNOSIS — M7989 Other specified soft tissue disorders: Secondary | ICD-10-CM | POA: Diagnosis not present

## 2018-11-22 DIAGNOSIS — I447 Left bundle-branch block, unspecified: Secondary | ICD-10-CM | POA: Diagnosis present

## 2018-11-22 DIAGNOSIS — M1990 Unspecified osteoarthritis, unspecified site: Secondary | ICD-10-CM | POA: Diagnosis present

## 2018-11-22 DIAGNOSIS — Z955 Presence of coronary angioplasty implant and graft: Secondary | ICD-10-CM | POA: Diagnosis not present

## 2018-11-22 LAB — HEPATIC FUNCTION PANEL
ALT: 19 U/L (ref 0–44)
AST: 25 U/L (ref 15–41)
Albumin: 4 g/dL (ref 3.5–5.0)
Alkaline Phosphatase: 61 U/L (ref 38–126)
BILIRUBIN DIRECT: 0.3 mg/dL — AB (ref 0.0–0.2)
Indirect Bilirubin: 1.1 mg/dL — ABNORMAL HIGH (ref 0.3–0.9)
Total Bilirubin: 1.4 mg/dL — ABNORMAL HIGH (ref 0.3–1.2)
Total Protein: 6.7 g/dL (ref 6.5–8.1)

## 2018-11-22 LAB — HEPARIN LEVEL (UNFRACTIONATED): HEPARIN UNFRACTIONATED: 0.33 [IU]/mL (ref 0.30–0.70)

## 2018-11-22 LAB — BASIC METABOLIC PANEL
Anion gap: 13 (ref 5–15)
BUN: 12 mg/dL (ref 8–23)
CALCIUM: 9.1 mg/dL (ref 8.9–10.3)
CO2: 24 mmol/L (ref 22–32)
Chloride: 103 mmol/L (ref 98–111)
Creatinine, Ser: 1.13 mg/dL (ref 0.61–1.24)
GFR calc Af Amer: >60 mL/min (ref >60)
GFR calc non Af Amer: >60 mL/min (ref >60)
Glucose, Bld: 121 mg/dL — ABNORMAL HIGH (ref 70–99)
Potassium: 4.6 mmol/L (ref 3.5–5.1)
Sodium: 140 mmol/L (ref 135–145)

## 2018-11-22 LAB — MAGNESIUM: Magnesium: 1.7 mg/dL (ref 1.7–2.4)

## 2018-11-22 LAB — TROPONIN I
TROPONIN I: 9.23 ng/mL — AB (ref <0.03)
Troponin I: 2.54 ng/mL (ref <0.03)

## 2018-11-22 LAB — CBC
HCT: 45.9 % (ref 39.0–52.0)
Hemoglobin: 14.8 g/dL (ref 13.0–17.0)
MCH: 30.8 pg (ref 26.0–34.0)
MCHC: 32.2 g/dL (ref 30.0–36.0)
MCV: 95.6 fL (ref 80.0–100.0)
Platelets: 169 10*3/uL (ref 150–400)
RBC: 4.8 MIL/uL (ref 4.22–5.81)
RDW: 13.8 % (ref 11.5–15.5)
WBC: 4.4 10*3/uL (ref 4.0–10.5)
nRBC: 0 % (ref 0.0–0.2)

## 2018-11-22 LAB — I-STAT TROPONIN, ED: Troponin i, poc: 0 ng/mL (ref 0.00–0.08)

## 2018-11-22 LAB — MRSA PCR SCREENING: MRSA by PCR: NEGATIVE

## 2018-11-22 MED ORDER — SODIUM CHLORIDE 0.9% FLUSH: 3.0000 mL | Freq: Two times a day (BID) | INTRAVENOUS | Status: DC

## 2018-11-22 MED ORDER — SODIUM CHLORIDE 0.9% FLUSH: 3.0000 mL | INTRAVENOUS | Status: DC | PRN

## 2018-11-22 MED ORDER — KETAMINE HCL 50 MG/5ML IJ SOSY
PREFILLED_SYRINGE | INTRAMUSCULAR | Status: AC
  Filled 2018-11-22: qty 5

## 2018-11-22 MED ORDER — LIDOCAINE IN D5W 4-5 MG/ML-% IV SOLN
1.0000 mg/min | INTRAVENOUS | Status: DC
  Administered 2018-11-22 – 2018-11-25 (×4): 1 mg/min via INTRAVENOUS
  Filled 2018-11-22 (×4): qty 500

## 2018-11-22 MED ORDER — KETAMINE HCL 50 MG/5ML IJ SOSY: 1.0000 mg/kg | PREFILLED_SYRINGE | Freq: Once | INTRAMUSCULAR | Status: DC

## 2018-11-22 MED ORDER — SODIUM CHLORIDE 0.9 % IV SOLN
INTRAVENOUS | Status: DC
  Administered 2018-11-22 – 2018-11-23 (×2): via INTRAVENOUS

## 2018-11-22 MED ORDER — LIDOCAINE HCL (CARDIAC) PF 100 MG/5ML IV SOSY
1.5000 mg/kg | PREFILLED_SYRINGE | Freq: Once | INTRAVENOUS | Status: AC
  Administered 2018-11-22: 138 mg via INTRAVENOUS
  Filled 2018-11-22: qty 10

## 2018-11-22 MED ORDER — HEPARIN SODIUM (PORCINE) 5000 UNIT/ML IJ SOLN
INTRAMUSCULAR | Status: AC
  Filled 2018-11-22: qty 1

## 2018-11-22 MED ORDER — PROPOFOL 10 MG/ML IV BOLUS
INTRAVENOUS | Status: AC
  Filled 2018-11-22: qty 20

## 2018-11-22 MED ORDER — ASPIRIN 81 MG PO CHEW: 324.0000 mg | CHEWABLE_TABLET | Freq: Once | ORAL | Status: DC

## 2018-11-22 MED ORDER — CARVEDILOL 3.125 MG PO TABS
3.1250 mg | ORAL_TABLET | Freq: Two times a day (BID) | ORAL | Status: DC
  Filled 2018-11-22: qty 1

## 2018-11-22 MED ORDER — AMIODARONE HCL IN DEXTROSE 360-4.14 MG/200ML-% IV SOLN
60.0000 mg/h | INTRAVENOUS | Status: AC
  Administered 2018-11-22 (×2): 60 mg/h via INTRAVENOUS
  Filled 2018-11-22: qty 200

## 2018-11-22 MED ORDER — SODIUM CHLORIDE 0.9% FLUSH
3.0000 mL | Freq: Once | INTRAVENOUS | Status: AC
  Administered 2018-11-22: 3 mL via INTRAVENOUS

## 2018-11-22 MED ORDER — HEPARIN (PORCINE) 25000 UT/250ML-% IV SOLN
1400.0000 [IU]/h | INTRAVENOUS | Status: DC
  Administered 2018-11-22 (×2): 1300 [IU]/h via INTRAVENOUS
  Administered 2018-11-23: 1400 [IU]/h via INTRAVENOUS
  Filled 2018-11-22 (×2): qty 250

## 2018-11-22 MED ORDER — AMIODARONE HCL IN DEXTROSE 360-4.14 MG/200ML-% IV SOLN
30.0000 mg/h | INTRAVENOUS | Status: DC
  Administered 2018-11-22 – 2018-11-25 (×6): 30 mg/h via INTRAVENOUS
  Filled 2018-11-22 (×8): qty 200

## 2018-11-22 MED ORDER — HEPARIN BOLUS VIA INFUSION
4000.0000 [IU] | Freq: Once | INTRAVENOUS | Status: AC
  Administered 2018-11-22: 4000 [IU] via INTRAVENOUS
  Filled 2018-11-22: qty 4000

## 2018-11-22 MED ORDER — LIDOCAINE BOLUS VIA INFUSION
1.5000 mg/kg | Freq: Once | INTRAVENOUS | Status: DC
  Filled 2018-11-22: qty 140

## 2018-11-22 MED ORDER — ASPIRIN 81 MG PO CHEW
81.0000 mg | CHEWABLE_TABLET | Freq: Every day | ORAL | Status: DC
  Administered 2018-11-24 – 2018-11-26 (×3): 81 mg via ORAL
  Filled 2018-11-22 (×4): qty 1

## 2018-11-22 MED ORDER — ATORVASTATIN CALCIUM 10 MG PO TABS
20.0000 mg | ORAL_TABLET | Freq: Every day | ORAL | Status: DC
  Administered 2018-11-22: 20 mg via ORAL
  Filled 2018-11-22: qty 2

## 2018-11-22 MED ORDER — SODIUM CHLORIDE 0.9 % IV SOLN: INTRAVENOUS | Status: DC

## 2018-11-22 MED ORDER — ASPIRIN 81 MG PO CHEW
81.0000 mg | CHEWABLE_TABLET | ORAL | Status: AC
  Administered 2018-11-23: 81 mg via ORAL
  Filled 2018-11-22: qty 1

## 2018-11-22 MED ORDER — AMIODARONE HCL IN DEXTROSE 360-4.14 MG/200ML-% IV SOLN
INTRAVENOUS | Status: AC
  Filled 2018-11-22: qty 200

## 2018-11-22 MED ORDER — AMIODARONE LOAD VIA INFUSION
150.0000 mg | Freq: Once | INTRAVENOUS | Status: AC
  Administered 2018-11-22: 150 mg via INTRAVENOUS
  Filled 2018-11-22: qty 83.34

## 2018-11-22 MED ORDER — MAGNESIUM SULFATE IN D5W 1-5 GM/100ML-% IV SOLN
1.0000 g | Freq: Once | INTRAVENOUS | Status: AC
  Administered 2018-11-22: 1 g via INTRAVENOUS
  Filled 2018-11-22: qty 100

## 2018-11-22 MED ORDER — METOPROLOL TARTRATE 5 MG/5ML IV SOLN
5.0000 mg | Freq: Once | INTRAVENOUS | Status: DC
  Filled 2018-11-22: qty 5

## 2018-11-22 MED ORDER — SACUBITRIL-VALSARTAN 97-103 MG PO TABS
1.0000 | ORAL_TABLET | Freq: Two times a day (BID) | ORAL | Status: DC
  Administered 2018-11-22 – 2018-11-26 (×6): 1 via ORAL
  Filled 2018-11-22 (×8): qty 1

## 2018-11-22 MED ORDER — DILTIAZEM HCL 25 MG/5ML IV SOLN
10.0000 mg | Freq: Once | INTRAVENOUS | Status: AC
  Administered 2018-11-22: 10 mg via INTRAVENOUS
  Filled 2018-11-22: qty 5

## 2018-11-22 NOTE — Progress Notes (Signed)
ANTICOAGULATION CONSULT NOTE - Initial Consult  Pharmacy Consult for heparin Indication: atrial fibrillation  Patient Measurements: Heparin Dosing Weight: 92 kg  Vital Signs: BP: 92/73 (02/11 0920) Pulse Rate: 140 (02/11 0920)  Labs: No results for input(s): HGB, HCT, PLT, APTT, LABPROT, INR, HEPARINUNFRC, HEPRLOWMOCWT, CREATININE, CKTOTAL, CKMB, TROPONINI in the last 72 hours.   Assessment: Admitted with chest pain. New onset AFib. He has a defibrillator - has not gone off. Patient is not on anticoagulation prior to admission. H/h wnl, SCr 1.1.  Goal of Therapy:  Heparin level 0.3-0.7 units/ml Monitor platelets by anticoagulation protocol: Yes    Plan:  -Heparin bolus 4000 units x1 then infuse at 1300 units/hr -Daily HL, CBC -Check level in 6 hours   Stephen Adams 11/22/2018,9:24 AM

## 2018-11-22 NOTE — ED Notes (Signed)
Report given to christine RN

## 2018-11-22 NOTE — ED Notes (Signed)
cardiology at bedside

## 2018-11-22 NOTE — ED Notes (Signed)
Pharmacy working on Lidocaine drip. Is not loaded in ED pyxis.

## 2018-11-22 NOTE — ED Notes (Signed)
Consent signed- room prepared for cardioversion. Time out done.

## 2018-11-22 NOTE — ED Notes (Signed)
Pt given 80 mg propofol and synchronized cardioverter at 120J- pt now with HR 40.

## 2018-11-22 NOTE — H&P (Addendum)
Cardiology Admission History and Physical:   Patient ID: Stephen Adams MRN: [id number]; DOB: 11-21-1945   Admission date: 11/22/2018  Primary Care Provider: Josetta Huddle, MD Primary Cardiologist: Dr. Percival Spanish  Primary Electrophysiologist:  Dr. Lovena Le  Chief Complaint:  Feeling unwell  Patient Profile:   Stephen Adams is a 73 y.o. male with ischemic cardiomyopathy, EF 25-30% 06/2018 s/p single chamber medtronic ICD, CAD (first MI in his early 36s) s/p CABG, HTN, HLD, AAA who was seen in the ER at the request of Dr. Maryan Rued for arrhythmia, found to be in VT  History of Present Illness:   [name] was in his usual state of health until he awoke around 7 AM this morning. He felt unwell, but got up, took care of his horses, but still felt off. Felt a tightness in his chest, like a pounding, not like his prior MI. It was constant and unchanged. As it did not go away, he thought he should get checked out.  Denies fevers, chills, recent illnesses. Weight has been stable. ICD has not fired. No LE edema, PND, orthopnea. No changes to meds.   In the ER, he was found to be in a wide complex rhythm at around 140 bpm. Medtronic remote interrogation reviewed. There is note of afib, but as he only has a single chamber device, this is based on algorithms and not atrial sensing. His VT zone is set >180, and his backup rate is at 40 bpm.  In the ER, he was given diltiazem initially, which dropped his blood pressure. I started amiodarone and heparin. Initial troponin (several hours after onset of symptoms) surprisingly negative, though this does not exclude ACS. After amiodarone bolus and drip, he was still hemodynamically stable, though his BP was drifting slightly lower. Lidocaine bolus and drip also started, but he began having intermittent hypotension. Decision was made with Dr. Maryan Rued to cardiovert. He received propofol and was cardioverted x1 with 120 J. He was v paced at 40 after shock, with  occasional native complexes conducting. He reported that his chest discomfort was gone post shock.  Past Medical History:  Diagnosis Date  . CAD (coronary artery disease)    a. 1989 PTCA LAD; b. 1999 BMS to LAD & RCA; c. 2000 PTCA RCA 2/2 ISR; d. 10/1999 Inf MI/VF Arrest: 4 stents to RCA 2/2 ISR; e. 05/2001 Cath: ISR RCA, sev LAD dzs, EF 35-40%-->CABG (details unknown); f. 09/2013 MV: Inferior infarct, no ischemia.  . Chronic systolic CHF (congestive heart failure) ([place])    a. 02/2015 Echo: EF 25-30%.  . DJD (degenerative joint disease)   . Hyperlipidemia   . Hypertensive heart disease   . 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 10/2008 s/p MDT [employer] DR single lead AICD. Ser # [id number] H.  . Ischemic cardiomyopathy    a. 02/2015 Echo: EF 25-30%, inf, apical AK, mid-apical-anteroseptal and ant HK, Gr1 DD, mild AI, mod dil LA.  . Subluxation of interphalangeal joint of lesser toe   . Subluxation of metatarsophalangeal joint of lesser toe     Past Surgical History:  Procedure Laterality Date  . CORONARY ARTERY BYPASS GRAFT     4 vessel [employer].  Dr. Burns Spain 06/2000  . EP IMPLANTABLE DEVICE N/A 05/05/2016   Procedure: ICD Generator Changeout;  Surgeon: Evans Lance, MD;  Location: [place] [facility];  Service: Cardiovascular;  Laterality: N/A;  . TOTAL KNEE ARTHROPLASTY Left   . TOTAL SHOULDER REPLACEMENT Bilateral      Medications  Prior to Admission: Prior to Admission medications   Medication Sig Start Date End Date Taking? Authorizing Provider  aspirin 81 MG tablet Take 81 mg by mouth daily.   Yes [provider]  atorvastatin (LIPITOR) 20 MG tablet TAKE 1 TABLET BY MOUTH EVERY DAY Patient taking differently: Take 20 mg by mouth daily at 6 PM.  08/15/18  Yes Minus Breeding, MD  carvedilol (COREG) 6.25 MG tablet Take 0.5 tablets (3.125 mg total) by mouth 2 (two) times daily. 08/17/18  Yes Baldwin Jamaica, PA-C  ENTRESTO 97-103 MG TAKE 1  TABLET BY MOUTH 2 (TWO) TIMES DAILY Patient taking differently: Take 1 tablet by mouth 2 (two) times daily.  08/15/18  Yes Minus Breeding, MD  furosemide (LASIX) 20 MG tablet Take 1 tablet (20 mg total) by mouth daily. [place] for #90 days if insurance is better covered Patient taking differently: Take 20 mg by mouth daily.  08/25/18  Yes Lendon Colonel, NP  potassium chloride SA (KLOR-CON M20) 20 MEQ tablet Take 1 tablet (20 mEq total) by mouth daily. Patient taking differently: Take 20 mEq by mouth every evening.  07/22/18 11/22/18 Yes Minus Breeding, MD     Allergies:    Allergies  Allergen Reactions  . Oxycodone-Acetaminophen Nausea Only    Social History:   Social History   Socioeconomic History  . Marital status: Married    Spouse name: Not on file  . Number of children: 4  . Years of education: Not on file  . Highest education level: Not on file  Occupational History  . Occupation: [employer]  . Financial resource strain: Not on file  . Food insecurity:    Worry: Not on file    Inability: Not on file  . Transportation needs:    Medical: Not on file    Non-medical: Not on file  Tobacco Use  . Smoking status: Never Smoker  . Smokeless tobacco: Never Used  Substance and Sexual Activity  . Alcohol use: Yes    Alcohol/week: 0.0 standard drinks    Comment: occas  . Drug use: No  . Sexual activity: Not on file  Lifestyle  . Physical activity:    Days per week: Not on file    Minutes per session: Not on file  . Stress: Not on file  Relationships  . Social connections:    Talks on phone: Not on file    Gets together: Not on file    Attends religious service: Not on file    Active member of club or organization: Not on file    Attends meetings of clubs or organizations: Not on file    Relationship status: Not on file  . Intimate partner violence:    Fear of current or ex partner: Not on file    Emotionally abused: Not on file    Physically  abused: Not on file    Forced sexual activity: Not on file  Other Topics Concern  . 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      Family History:   The patient's family history includes CAD in his mother; Heart Problems in his father and maternal grandfather; Hypertension in his father; Pancreatic cancer in his mother.    ROS:  Please see the history of present illness.  All other ROS reviewed and negative.     Physical Exam/Data:   Vitals:   11/22/18 0945 11/22/18 1015  11/22/18 1030 11/22/18 1040  BP: 98/72 97/67 (!) 82/68   Pulse: (!) 139 (!) 44 (!) 45 (!) 139  Resp: 18 19 (!) 22 14  SpO2: 97% 97% 97% 100%   No intake or output data in the 24 hours ending 11/22/18 1048 Last 3 Weights 08/25/2018 08/17/2018 07/22/2018  Weight (lbs) 203 lb 9.6 oz 205 lb 200 lb  Weight (kg) 92.352 kg 92.987 kg 90.719 kg     There is no height or weight on file to calculate BMI.  General:  Well nourished, well developed, in no acute distress HEENT: normal Lymph: no adenopathy Neck: no JVD Endocrine:  No thryomegaly Vascular: No carotid bruits; RA pulses 2+ bilaterally Cardiac:  (prior to shock, tachycardic. After shock, bradycardic) normal S1, S2; RRR; no murmur Lungs:  clear to auscultation bilaterally, no wheezing, rhonchi or rales  Abd: soft, nontender, no hepatomegaly  Ext: no edema Musculoskeletal:  No deformities, BUE and BLE strength normal and equal Skin: warm and dry  Neuro:  CNs 2-12 intact, no focal abnormalities noted Psych:  Normal affect    EKG:  The ECG that was done was personally reviewed and demonstrates VT at approximately 140 bpm  Relevant CV Studies: Prior studies reviewed  Laboratory Data:  Chemistry Recent Labs  Lab 11/22/18 0856  NA 140  K 4.6  CL 103  CO2 24  GLUCOSE 121*  BUN 12  CREATININE 1.13  CALCIUM 9.1  GFRNONAA >60  GFRAA >60  ANIONGAP 13    Recent Labs  Lab 11/22/18 0922  PROT 6.7  ALBUMIN  4.0  AST 25  ALT 19  ALKPHOS 61  BILITOT 1.4*   Hematology Recent Labs  Lab 11/22/18 0856  WBC 4.4  RBC 4.80  HGB 14.8  HCT 45.9  MCV 95.6  MCH 30.8  MCHC 32.2  RDW 13.8  PLT 169   Cardiac EnzymesNo results for input(s): TROPONINI in the last 168 hours.  Recent Labs  Lab 11/22/18 0852  TROPIPOC 0.00    BNPNo results for input(s): BNP, PROBNP in the last 168 hours.  DDimer No results for input(s): DDIMER in the last 168 hours.  Radiology/Studies:  Dg Chest Port 1 View  Result Date: 11/22/2018 CLINICAL DATA:  Chest pain since 0700 today. Hx of ischemic cardiomyopathy, hypertensive heart disease, chronic systolic CHF, coronary artery disease, CABG x4(2001), defibrillator(2017). Nonsmoker. EXAM: PORTABLE CHEST 1 VIEW COMPARISON:  01/28/2018 FINDINGS: Patient has LEFT-sided AICD with lead to the RIGHT ventricle. Median sternotomy and CABG. The heart is enlarged. There is pulmonary vascular congestion and mild interstitial pulmonary edema. No consolidations. Previous bilateral shoulder arthroplasty. IMPRESSION: Cardiomegaly and mild interstitial edema. Electronically Signed   By: Nolon Nations M.D.   On: 11/22/2018 09:49    Assessment and Plan:   1. Ventricular tachycardia, sustained: did not respond to amiodarone and lidocaine, and when his blood pressure began to drop, decision was made and confirmed by patient to proceed with cardioversion of his VT. This successfully converted him to a V paced rhythm -unclear trigger, but concern is for ischemia given his history. No recent illnesses, electrolytes normal, no recent med changes. -will continue heparin, amiodarone, and lidocaine for now. Continue beta blocker as long as BP tolerates. -once he is stable, will pursue cath to evaluate for reversible ischemia Risks and benefits of cardiac catheterization have been discussed with the patient.  These include bleeding, infection, kidney damage, stroke, heart attack, death.  The  patient understands these risks and is willing to  proceed.  -appreciate the input of the EP team via personal communication -he is warm on exam, but concern if this is progression of his heart failure. If VT recurs, may need to consider temporary mechanical support -has not been seen by advanced heart failure in the past, may need to be evaluated this admission depending on workup  CAD, s/p CABG: concern for ischemia as cause -continue aspirin, atorvastatin -on heparin given concern for ischemia as cause, trend troponins (though with cardioversion, may expect some elevation)  Ischemic cardiomyopathy with chronic systolic heart failure -continue carvedilol, entresto  Severity of Illness: The appropriate patient status for this patient is INPATIENT. Inpatient status is judged to be reasonable and necessary in order to provide the required intensity of service to ensure the patient's safety. The patient's presenting symptoms, physical exam findings, and initial radiographic and laboratory data in the context of their chronic comorbidities is felt to place them at high risk for further clinical deterioration. Furthermore, it is not anticipated that the patient will be medically stable for discharge from the hospital within 2 midnights of admission. The following factors support the patient status of inpatient.   " The patient's presenting symptoms include sustained ventricular tachycardia. " The worrisome physical exam findings include tachycardia. " The initial radiographic and laboratory data are worrisome because of known chronic systolic heart failure. " The chronic co-morbidities include heart failure, CAD.   * I certify that at the point of admission it is my clinical judgment that the patient will require inpatient hospital care spanning beyond 2 midnights from the point of admission due to high intensity of service, high risk for further deterioration and high frequency of surveillance  required.*   For questions or updates, please contact [facility] Please consult [email or web address] for contact info under   CRITICAL CARE Patient is critically ill with multiple organ systems affected and requires high complexity decision making. Total critical care time: >90 minutes. This time includes gathering of history, evaluation of patient's response to treatment, examination of patient, review of laboratory and imaging studies, and coordination with consultants.    Signed, Buford Dresser, MD  11/22/2018 10:48 AM

## 2018-11-22 NOTE — ED Provider Notes (Addendum)
[facility] [place] EMERGENCY DEPARTMENT Provider Note   CSN: [id number] Arrival date & time: 11/22/18  5784     History   Chief Complaint Chief Complaint  Patient presents with  . Chest Pain    HPI Malone Vanblarcom is a 73 y.o. male.  Patient is a 73 year old male with known cardiac disease, cardiomyopathy and EF of 25 to 30% presenting today stating he just does not feel well.  Started having chest pain this morning after he went to feed his horses.  Patient states yesterday was a totally normal day.  He felt fine and had been very busy but was his normal self.  He has not had any recent medication changes and did take all of his medicines this morning.  His defibrillator has not gone off and he was scheduled to have an interrogation tomorrow.  He takes 81 mg of aspirin daily but is not on anticoagulation.  No prior history of dysrhythmia but prior EKGs that showed left bundle branch block.  He denies recent illness with cough, congestion, weight increase, fever or shortness of breath.  He has had no abdominal pain nausea or vomiting.  The history is provided by the patient.  Chest Pain  Pain location:  Substernal area Pain quality: aching   Pain radiates to:  Does not radiate Pain severity:  Moderate Onset quality:  Gradual Duration:  3 hours Timing:  Constant Progression:  Worsening Chronicity:  New Context comment:  Woke up this morning not feeling quite right and it just continued to get worse as the morning progressed Relieved by:  Nothing Worsened by:  Nothing Ineffective treatments:  None tried Associated symptoms: weakness   Associated symptoms: no abdominal pain, no altered mental status, no back pain, no cough, no fever, no lower extremity edema and no nausea   Risk factors comment:  History of coronary artery disease status post CABG approximately 20 years ago.  CHF with an EF of 25 to 30%.  ICD in place.   Past Medical History:  Diagnosis Date  . CAD  (coronary artery disease)    a. 1989 PTCA LAD; b. 1999 BMS to LAD & RCA; c. 2000 PTCA RCA 2/2 ISR; d. 10/1999 Inf MI/VF Arrest: 4 stents to RCA 2/2 ISR; e. 05/2001 Cath: ISR RCA, sev LAD dzs, EF 35-40%-->CABG (details unknown); f. 09/2013 MV: Inferior infarct, no ischemia.  . Chronic systolic CHF (congestive heart failure) ([place])    a. 02/2015 Echo: EF 25-30%.  . DJD (degenerative joint disease)   . Hyperlipidemia   . Hypertensive heart disease   . 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 10/2008 s/p MDT [employer] DR single lead AICD. Ser # [id number].  . Ischemic cardiomyopathy    a. 02/2015 Echo: EF 25-30%, inf, apical AK, mid-apical-anteroseptal and ant HK, Gr1 DD, mild AI, mod dil LA.  . Subluxation of interphalangeal joint of lesser toe   . Subluxation of metatarsophalangeal joint of lesser toe     Patient Active Problem List   Diagnosis Date Noted  . Abnormal laboratory test 07/22/2018  . Degenerative joint disease 07/22/2018  . Dyslipidemia 07/22/2018  . Long term current use of therapeutic drug 07/22/2018  . Pneumonia 07/22/2018  . Encounter for general adult medical examination without abnormal findings 07/22/2018  . Encounter for general adult medical examination with abnormal findings 07/22/2018  . Vitamin D deficiency 07/22/2018  . Pure hypercholesterolemia 07/22/2018  . Hypertension 07/22/2018  . Abdominal aortic aneurysm (AAA) without rupture ([place])  04/16/2017  . Arthritis of carpometacarpal (CMC) joint of both thumbs 02/16/2017  . Bilateral carpal tunnel syndrome 02/16/2017  . Ventricular tachycardia ([place]) 05/05/2016  . Injury of foot, left 03/23/2016  . Hyponatremia   . Syncope 03/02/2016  . Contracture of tendon 02/04/2016  . Chronic systolic CHF (congestive heart failure) ([place])   . ICD (implantable cardioverter-defibrillator) battery depletion   . Ischemic cardiomyopathy   . Hyperlipidemia   . Hypertensive heart disease   . Subluxation  01/27/2016  . Contracture of left Achilles tendon 01/27/2016  . Osteoarthritis of subtalar joint, left 01/27/2016  . Pes planovalgus, acquired, left 01/27/2016  . Hallux valgus of left foot 01/27/2016  . Primary osteoarthritis of foot 01/27/2016  . Cardiomyopathy, ischemic 02/28/2015  . CAD (coronary artery disease) 02/28/2015  . Hx of CABG 02/28/2015  . URI (upper respiratory infection) 03/27/2014  . Preoperative testing 09/15/2013  . PTTD (posterior tibial tendon dysfunction) 01/10/2013  . Skin infection 12/08/2012  . Atherosclerotic heart disease of native coronary artery without angina pectoris 12/08/2012  . Hypercholesteremia 12/08/2012  . History of dysplastic nevus 05/17/2012  . Knee pain 04/07/2011  . Presence of automatic (implantable) cardiac defibrillator 03/17/2011  . OSA (obstructive sleep apnea) 03/17/2011  . ED (erectile dysfunction) 08/06/2010  . Urinary tract infection, site not specified 08/06/2010    Past Surgical History:  Procedure Laterality Date  . CORONARY ARTERY BYPASS GRAFT     4 vessel [employer].  Dr. Burns Spain 06/2000  . EP IMPLANTABLE DEVICE N/A 05/05/2016   Procedure: ICD Generator Changeout;  Surgeon: Evans Lance, MD;  Location: [facility];  Service: Cardiovascular;  Laterality: N/A;  . TOTAL KNEE ARTHROPLASTY Left   . TOTAL SHOULDER REPLACEMENT Bilateral         Home Medications    Prior to Admission medications   Medication Sig Start Date End Date Taking? Authorizing Provider  aspirin 81 MG tablet Take 81 mg by mouth daily.    [provider]  atorvastatin (LIPITOR) 20 MG tablet TAKE 1 TABLET BY MOUTH EVERY DAY 08/15/18   Minus Breeding, MD  carvedilol (COREG) 6.25 MG tablet Take 0.5 tablets (3.125 mg total) by mouth 2 (two) times daily. 08/17/18   Baldwin Jamaica, PA-C  ENTRESTO 97-103 MG TAKE 1 TABLET BY MOUTH 2 (TWO) TIMES DAILY 08/15/18   Minus Breeding, MD  furosemide (LASIX) 20 MG tablet Take 1 tablet (20 mg  total) by mouth daily. [place] for #90 days if insurance is better covered 08/25/18   Lendon Colonel, NP  potassium chloride SA (KLOR-CON M20) 20 MEQ tablet Take 1 tablet (20 mEq total) by mouth daily. 07/22/18 10/20/18  Minus Breeding, MD    Family History Family History  Problem Relation Age of Onset  . Hypertension Father   . Heart Problems Father        bypass 1992  . CAD Mother   . Pancreatic cancer Mother   . Heart Problems Maternal Grandfather     Social History Social History   Tobacco Use  . Smoking status: Never Smoker  . Smokeless tobacco: Never Used  Substance Use Topics  . Alcohol use: Yes    Alcohol/week: 0.0 standard drinks    Comment: occas  . Drug use: No     Allergies   Oxycodone-acetaminophen   Review of Systems Review of Systems  Constitutional: Negative for fever.  Respiratory: Negative for cough.   Cardiovascular: Positive for chest pain.  Gastrointestinal: Negative for abdominal pain and  nausea.  Musculoskeletal: Negative for back pain.  Neurological: Positive for weakness.  All other systems reviewed and are negative.    Physical Exam Updated Vital Signs BP 98/72   Pulse (!) 139   Resp 18   SpO2 97%   Physical Exam Vitals signs and nursing note reviewed.  Constitutional:      Appearance: He is well-developed. He is ill-appearing.  HENT:     Head: Normocephalic and atraumatic.  Eyes:     Conjunctiva/sclera: Conjunctivae normal.     Pupils: Pupils are equal, round, and reactive to light.  Neck:     Musculoskeletal: Normal range of motion and neck supple.  Cardiovascular:     Rate and Rhythm: Tachycardia present. Rhythm irregular.     Pulses: Normal pulses.     Heart sounds: No murmur.  Pulmonary:     Effort: Pulmonary effort is normal. No respiratory distress.     Breath sounds: Normal breath sounds. No wheezing or rales.  Abdominal:     General: There is no distension.     Palpations: Abdomen is soft.     Tenderness: There  is no abdominal tenderness. There is no guarding or rebound.  Musculoskeletal: Normal range of motion.        General: No tenderness.  Skin:    General: Skin is warm and dry.     Capillary Refill: Capillary refill takes 2 to 3 seconds.     Findings: No erythema or rash.  Neurological:     General: No focal deficit present.     Mental Status: He is alert and oriented to person, place, and time. Mental status is at baseline.  Psychiatric:        Mood and Affect: Mood normal.        Behavior: Behavior normal.      ED Treatments / Results  Labs (all labs ordered are listed, but only abnormal results are displayed) Labs Reviewed  BASIC METABOLIC PANEL - Abnormal; Notable for the following components:      Result Value   Glucose, Bld 121 (*)    All other components within normal limits  CBC  HEPATIC FUNCTION PANEL  HEPARIN LEVEL (UNFRACTIONATED)  I-STAT TROPONIN, ED    EKG EKG Interpretation  Date/Time:  Tuesday November 22 2018 08:45:20 EST Ventricular Rate:  142 PR Interval:    QRS Duration: 180 QT Interval:  397 QTC Calculation: 611 R Axis:   -162 Text Interpretation:  new  Atrial fibrillation Left atrial enlargement Right bundle branch block ST depr, consider ischemia, anterolateral lds Confirmed by Blanchie Dessert ([id number]) on 11/22/2018 9:19:56 AM   Radiology Dg Chest Port 1 View  Result Date: 11/22/2018 CLINICAL DATA:  Chest pain since 0700 today. Hx of ischemic cardiomyopathy, hypertensive heart disease, chronic systolic CHF, coronary artery disease, CABG x4(2001), defibrillator(2017). Nonsmoker. EXAM: PORTABLE CHEST 1 VIEW COMPARISON:  01/28/2018 FINDINGS: Patient has LEFT-sided AICD with lead to the RIGHT ventricle. Median sternotomy and CABG. The heart is enlarged. There is pulmonary vascular congestion and mild interstitial pulmonary edema. No consolidations. Previous bilateral shoulder arthroplasty. IMPRESSION: Cardiomegaly and mild interstitial edema.  Electronically Signed   By: Nolon Nations M.D.   On: 11/22/2018 09:49    Procedures .Sedation Date/Time: 11/22/2018 3:19 PM Performed by: Blanchie Dessert, MD Authorized by: Blanchie Dessert, MD   Consent:    Consent obtained:  Verbal   Consent given by:  Patient   Risks discussed:  Allergic reaction, dysrhythmia, inadequate sedation, nausea, prolonged hypoxia resulting in  organ damage, prolonged sedation necessitating reversal, respiratory compromise necessitating ventilatory assistance and intubation and vomiting   Alternatives discussed:  Analgesia without sedation, anxiolysis and regional anesthesia Universal protocol:    Procedure explained and questions answered to patient or proxy's satisfaction: yes     Relevant documents present and verified: yes     Test results available and properly labeled: yes     Imaging studies available: yes     Required blood products, implants, devices, and special equipment available: yes     Site/side marked: yes     Immediately prior to procedure a time out was called: yes     Patient identity confirmation method:  Verbally with patient, arm band, anonymous protocol, patient vented/unresponsive and provided demographic data Indications:    Procedure performed:  Cardioversion   Procedure necessitating sedation performed by:  Physician performing sedation Pre-sedation assessment:    Time since last food or drink:  >6 hours   ASA classification: class 3 - patient with severe systemic disease     Neck mobility: normal     Mouth opening:  3 or more finger widths   Thyromental distance:  4 finger widths   Mallampati score:  I - soft palate, uvula, fauces, pillars visible   Pre-sedation assessments completed and reviewed: airway patency, cardiovascular function, hydration status, mental status, nausea/vomiting, pain level, respiratory function and temperature     Pre-sedation assessment completed:  11/22/2018 12:21 PM Immediate pre-procedure  details:    Reassessment: Patient reassessed immediately prior to procedure     Reviewed: vital signs, relevant labs/tests and NPO status     Verified: bag valve mask available, emergency equipment available, intubation equipment available, IV patency confirmed, oxygen available and suction available   Procedure details (see MAR for exact dosages):    Preoxygenation:  Nasal cannula   Sedation:  Propofol   Intra-procedure monitoring:  Blood pressure monitoring, cardiac monitor, continuous pulse oximetry, frequent LOC assessments, frequent vital sign checks and continuous capnometry   Intra-procedure events: hypoxia     Intra-procedure management:  BVM ventilation and airway repositioning   Total Provider sedation time (minutes):  10 Post-procedure details:    Post-sedation assessment completed:  11/22/2018 1:21 PM   Attendance: Constant attendance by certified staff until patient recovered     Recovery: Patient returned to pre-procedure baseline     Post-sedation assessments completed and reviewed: airway patency, cardiovascular function, hydration status, mental status, nausea/vomiting, pain level, respiratory function and temperature     Patient is stable for discharge or admission: yes     Patient tolerance:  Tolerated well, no immediate complications .Cardioversion Date/Time: 11/22/2018 3:22 PM Performed by: Blanchie Dessert, MD Authorized by: Blanchie Dessert, MD   Consent:    Consent obtained:  Verbal   Consent given by:  Patient and spouse   Risks discussed:  Death, induced arrhythmia and pain   Alternatives discussed:  Alternative treatment Pre-procedure details:    Cardioversion basis:  Emergent   Rhythm:  Ventricular tachycardia   Electrode placement:  Anterior-posterior Patient sedated: Yes. Refer to sedation procedure documentation for details of sedation.  Attempt one:    Cardioversion mode:  Synchronous   Waveform:  Biphasic   Shock (Joules):  120   Shock outcome:   Conversion to normal sinus rhythm Post-procedure details:    Patient status:  Awake   Patient tolerance of procedure:  Tolerated well, no immediate complications   (including critical care time)  Medications Ordered in ED Medications  metoprolol tartrate (LOPRESSOR)  injection 5 mg (5 mg Intravenous Not Given 11/22/18 0915)  amiodarone (NEXTERONE) 1.8 mg/mL load via infusion 150 mg (150 mg Intravenous Bolus from Bag 11/22/18 0915)  amiodarone (NEXTERONE PREMIX) 360-4.14 MG/200ML-% (1.8 mg/mL) IV infusion (has no administration in time range)  amiodarone (NEXTERONE PREMIX) 360-4.14 MG/200ML-% (1.8 mg/mL) IV infusion (60 mg/hr Intravenous New Bag/Given 11/22/18 0915)    Followed by  amiodarone (NEXTERONE PREMIX) 360-4.14 MG/200ML-% (1.8 mg/mL) IV infusion (has no administration in time range)  heparin 5000 UNIT/ML injection (has no administration in time range)  sodium chloride flush (NS) 0.9 % injection 3 mL (3 mLs Intravenous Given 11/22/18 0904)  diltiazem (CARDIZEM) injection 10 mg (10 mg Intravenous Given 11/22/18 0900)  heparin bolus via infusion 4,000 Units (4,000 Units Intravenous Bolus from Bag 11/22/18 0921)     Initial Impression / Assessment and Plan / ED Course  I have reviewed the triage vital signs and the nursing notes.  Pertinent labs & imaging results that were available during my care of the patient were reviewed by me and considered in my medical decision making (see chart for details).     Elderly male with cardiac history presenting with a wide-complex tachycardia.  Initial EKG was starting for V. tach.  However repeat EKG looked more like atrial fibrillation with left bundle branch block.  Patient is complaining of chest pain however concerned that the chest pain may be a result of his abnormal rate.  Lower suspicion for ACS.  Patient has no symptoms suggestive of PE, pulmonary infection, electrolyte abnormality as the cause of the atrial fibrillation.  This would be new  onset and spontaneous.  Discussed with [employer] and he did have an episode yesterday as well but everything today has been atrial in nature without any evidence of ventricular rhythm.  The defibrillator has not gone off.  Discussed with cardiology.  Patient had 2 boluses of Cardizem without improvement in his rate.  It did make him hypotensive into the 80s.  He was then started on an amiodarone bolus and drip as well as heparin bolus and drip.  Cardiology will come and evaluate the patient.  After amiodarone was started patient states he starting to feel little better but rates are still in the 140s.  9:57 AM Labs without acute findings.  Will admit for further care. CHA2DS2/VAS Stroke Risk Points      N/A >= 2 Points: High Risk  1 - 1.99 Points: Medium Risk  0 Points: Low Risk    A final score could not be computed because of missing components.:   Last Change: N/A     This score determines the patient's risk of having a stroke if the  patient has atrial fibrillation.      This score is not applicable to this patient. Components are not  calculated.      CRITICAL CARE Performed by: Demetrice Combes Total critical care time: 40 minutes Critical care time was exclusive of separately billable procedures and treating other patients. Critical care was necessary to treat or prevent imminent or life-threatening deterioration. Critical care was time spent personally by me on the following activities: development of treatment plan with patient and/or surrogate as well as nursing, discussions with consultants, evaluation of patient's response to treatment, examination of patient, obtaining history from patient or surrogate, ordering and performing treatments and interventions, ordering and review of laboratory studies, ordering and review of radiographic studies, pulse oximetry and re-evaluation of patient's condition.  After seen by cardiology they wanted  to do a lidocaine bolus and drip.  Patient  however remained tachycardic with a wide rhythm with borderline blood pressures.  Despite [employer] reading it was then thought he may be in V. tach.  Blood pressure did improve to 104/70.  The decision was made to use propofol and cardiovert.  Procedure as documented above.  Patient's heart rate then improved to the 40s and was paced by his defibrillator.  Final Clinical Impressions(s) / ED Diagnoses   Final diagnoses:  Atrial fibrillation, rapid ([place])  V-tach [facility])    ED Discharge Orders    None       Blanchie Dessert, MD 11/22/18 0335    Blanchie Dessert, MD 11/22/18 1529

## 2018-11-22 NOTE — ED Triage Notes (Addendum)
Pt here for eval of centralized chest pain that started at 0700. Denies associated symptoms. HR 280 in triage.

## 2018-11-22 NOTE — Progress Notes (Signed)
ANTICOAGULATION CONSULT NOTE  Pharmacy Consult: Heparin Indication: atrial fibrillation  Patient Measurements: Heparin Dosing Weight: 92 kg  Vital Signs: Temp: 97.7 F (36.5 C) (02/11 1252) Temp Source: Oral (02/11 1252) BP: 100/73 (02/11 1252) Pulse Rate: 40 (02/11 1252)  Labs: Recent Labs    11/22/18 0856 11/22/18 1534  HGB 14.8  --   HCT 45.9  --   PLT 169  --   HEPARINUNFRC  --  0.33  CREATININE 1.13  --      Assessment: 72 YOM presented with chest pain and found to have new-onset Afib.  He has a defibrillator - has not gone off.  Pharmacy consulted to dose IV heparin.  Noted he is s/p cardioversion today 11/22/18.  Heparin level is therapeutic and toward the low end of normal.  No bleeding reported.  Goal of Therapy:  Heparin level 0.3-0.7 units/ml Monitor platelets by anticoagulation protocol: Yes    Plan:  Increase heparin gtt to 1400 units/hr Check confirmatory heparin level given s/p cardioversion   Abrian Hanover D. Mina Marble, PharmD, BCPS, [place] 11/22/2018, 4:44 PM

## 2018-11-22 NOTE — Progress Notes (Signed)
RT called to be on standby for conscious sedation for cardioversion.  After patient cardioverted patient sats began to drop to 86% and did not improve after increasing nasal cannula to 6L.  Began to bask-mask assist patient.  Sats improved to 100%.  Continued to help assist with ventilation until patient woke.  Patient now awake and tolerating well.

## 2018-11-23 ENCOUNTER — Encounter (HOSPITAL_COMMUNITY): Payer: Self-pay | Admitting: Cardiology

## 2018-11-23 ENCOUNTER — Encounter (HOSPITAL_COMMUNITY)
Admission: EM | Disposition: A | Discharge status: Home / Self Care | Payer: Self-pay | Source: Home / Self Care | Attending: Cardiology

## 2018-11-23 ENCOUNTER — Inpatient Hospital Stay (HOSPITAL_COMMUNITY): Payer: Medicare Other

## 2018-11-23 DIAGNOSIS — I255 Ischemic cardiomyopathy: Secondary | ICD-10-CM

## 2018-11-23 DIAGNOSIS — I340 Nonrheumatic mitral (valve) insufficiency: Secondary | ICD-10-CM

## 2018-11-23 DIAGNOSIS — I214 Non-ST elevation (NSTEMI) myocardial infarction: Secondary | ICD-10-CM

## 2018-11-23 DIAGNOSIS — I25118 Atherosclerotic heart disease of native coronary artery with other forms of angina pectoris: Secondary | ICD-10-CM

## 2018-11-23 DIAGNOSIS — I25810 Atherosclerosis of coronary artery bypass graft(s) without angina pectoris: Secondary | ICD-10-CM

## 2018-11-23 HISTORY — PX: RIGHT/LEFT HEART CATH AND CORONARY/GRAFT ANGIOGRAPHY: CATH118267

## 2018-11-23 LAB — MAGNESIUM: Magnesium: 2 mg/dL (ref 1.7–2.4)

## 2018-11-23 LAB — POCT I-STAT EG7
ACID-BASE DEFICIT: 1 mmol/L (ref 0.0–2.0)
Bicarbonate: 24 mmol/L (ref 20.0–28.0)
CALCIUM ION: 1.17 mmol/L (ref 1.15–1.40)
HCT: 41 % (ref 39.0–52.0)
Hemoglobin: 13.9 g/dL (ref 13.0–17.0)
O2 Saturation: 68 %
Potassium: 4 mmol/L (ref 3.5–5.1)
Sodium: 136 mmol/L (ref 135–145)
TCO2: 25 mmol/L (ref 22–32)
pCO2, Ven: 41.4 mmHg — ABNORMAL LOW (ref 44.0–60.0)
pH, Ven: 7.372 (ref 7.250–7.430)
pO2, Ven: 37 mmHg (ref 32.0–45.0)

## 2018-11-23 LAB — CBC
HEMATOCRIT: 38.2 % — AB (ref 39.0–52.0)
Hemoglobin: 13 g/dL (ref 13.0–17.0)
MCH: 31.6 pg (ref 26.0–34.0)
MCHC: 34 g/dL (ref 30.0–36.0)
MCV: 92.7 fL (ref 80.0–100.0)
Platelets: 142 10*3/uL — ABNORMAL LOW (ref 150–400)
RBC: 4.12 MIL/uL — ABNORMAL LOW (ref 4.22–5.81)
RDW: 14.1 % (ref 11.5–15.5)
WBC: 5.1 10*3/uL (ref 4.0–10.5)
nRBC: 0 % (ref 0.0–0.2)

## 2018-11-23 LAB — ECHOCARDIOGRAM COMPLETE
Height: 71 in
Weight: 3245.17 oz

## 2018-11-23 LAB — BASIC METABOLIC PANEL
Anion gap: 9 (ref 5–15)
BUN: 13 mg/dL (ref 8–23)
CALCIUM: 8.6 mg/dL — AB (ref 8.9–10.3)
CHLORIDE: 102 mmol/L (ref 98–111)
CO2: 23 mmol/L (ref 22–32)
CREATININE: 0.87 mg/dL (ref 0.61–1.24)
GFR calc Af Amer: >60 mL/min (ref >60)
GFR calc non Af Amer: >60 mL/min (ref >60)
Glucose, Bld: 95 mg/dL (ref 70–99)
Potassium: 4.1 mmol/L (ref 3.5–5.1)
Sodium: 134 mmol/L — ABNORMAL LOW (ref 135–145)

## 2018-11-23 LAB — POCT I-STAT 7, (LYTES, BLD GAS, ICA,H+H)
Acid-base deficit: 2 mmol/L (ref 0.0–2.0)
Bicarbonate: 23.1 mmol/L (ref 20.0–28.0)
Calcium, Ion: 1.14 mmol/L — ABNORMAL LOW (ref 1.15–1.40)
HCT: 41 % (ref 39.0–52.0)
Hemoglobin: 13.9 g/dL (ref 13.0–17.0)
O2 Saturation: 95 %
Potassium: 3.9 mmol/L (ref 3.5–5.1)
Sodium: 136 mmol/L (ref 135–145)
TCO2: 24 mmol/L (ref 22–32)
pCO2 arterial: 39 mmHg (ref 32.0–48.0)
pH, Arterial: 7.381 (ref 7.350–7.450)
pO2, Arterial: 76 mmHg — ABNORMAL LOW (ref 83.0–108.0)

## 2018-11-23 LAB — POCT ACTIVATED CLOTTING TIME: ACTIVATED CLOTTING TIME: 125 s

## 2018-11-23 LAB — TROPONIN I: Troponin I: 7.36 ng/mL (ref <0.03)

## 2018-11-23 LAB — HEPARIN LEVEL (UNFRACTIONATED)
Heparin Unfractionated: 0.55 IU/mL (ref 0.30–0.70)
Heparin Unfractionated: 0.56 IU/mL (ref 0.30–0.70)

## 2018-11-23 SURGERY — RIGHT/LEFT HEART CATH AND CORONARY/GRAFT ANGIOGRAPHY
Anesthesia: LOCAL

## 2018-11-23 MED ORDER — CLOPIDOGREL BISULFATE 300 MG PO TABS
600.0000 mg | ORAL_TABLET | Freq: Once | ORAL | Status: AC
  Administered 2018-11-23: 600 mg via ORAL
  Filled 2018-11-23: qty 2

## 2018-11-23 MED ORDER — HEPARIN (PORCINE) IN NACL 1000-0.9 UT/500ML-% IV SOLN
INTRAVENOUS | Status: DC | PRN
  Administered 2018-11-23: 500 mL

## 2018-11-23 MED ORDER — SODIUM CHLORIDE 0.9 % IV SOLN: 250.0000 mL | INTRAVENOUS | Status: DC | PRN

## 2018-11-23 MED ORDER — SODIUM CHLORIDE 0.9% FLUSH: 3.0000 mL | INTRAVENOUS | Status: DC | PRN

## 2018-11-23 MED ORDER — HEPARIN (PORCINE) IN NACL 1000-0.9 UT/500ML-% IV SOLN
INTRAVENOUS | Status: AC
  Filled 2018-11-23: qty 500

## 2018-11-23 MED ORDER — HEPARIN (PORCINE) 25000 UT/250ML-% IV SOLN
1500.0000 [IU]/h | INTRAVENOUS | Status: DC
  Administered 2018-11-24: 1500 [IU]/h via INTRAVENOUS
  Administered 2018-11-24: 1400 [IU]/h via INTRAVENOUS
  Filled 2018-11-23 (×2): qty 250

## 2018-11-23 MED ORDER — FENTANYL CITRATE (PF) 100 MCG/2ML IJ SOLN
INTRAMUSCULAR | Status: DC | PRN
  Administered 2018-11-23 (×2): 25 ug via INTRAVENOUS

## 2018-11-23 MED ORDER — HEPARIN (PORCINE) IN NACL 1000-0.9 UT/500ML-% IV SOLN
INTRAVENOUS | Status: DC | PRN
  Administered 2018-11-23 (×2): 500 mL

## 2018-11-23 MED ORDER — HEPARIN (PORCINE) IN NACL 1000-0.9 UT/500ML-% IV SOLN
INTRAVENOUS | Status: AC
  Filled 2018-11-23: qty 1000

## 2018-11-23 MED ORDER — CLOPIDOGREL BISULFATE 75 MG PO TABS
75.0000 mg | ORAL_TABLET | Freq: Every day | ORAL | Status: DC
  Administered 2018-11-24 – 2018-11-26 (×3): 75 mg via ORAL
  Filled 2018-11-23 (×3): qty 1

## 2018-11-23 MED ORDER — MIDAZOLAM HCL 2 MG/2ML IJ SOLN
INTRAMUSCULAR | Status: AC
  Filled 2018-11-23: qty 2

## 2018-11-23 MED ORDER — LIDOCAINE HCL (PF) 1 % IJ SOLN
INTRAMUSCULAR | Status: DC | PRN
  Administered 2018-11-23: 17 mL via INTRADERMAL

## 2018-11-23 MED ORDER — IOHEXOL 350 MG/ML SOLN
INTRAVENOUS | Status: DC | PRN
  Administered 2018-11-23: 180 mL via INTRA_ARTERIAL

## 2018-11-23 MED ORDER — ATORVASTATIN CALCIUM 80 MG PO TABS
80.0000 mg | ORAL_TABLET | Freq: Every day | ORAL | Status: DC
  Administered 2018-11-23 – 2018-11-25 (×3): 80 mg via ORAL
  Filled 2018-11-23 (×3): qty 1

## 2018-11-23 MED ORDER — FENTANYL CITRATE (PF) 100 MCG/2ML IJ SOLN
INTRAMUSCULAR | Status: AC
  Filled 2018-11-23: qty 2

## 2018-11-23 MED ORDER — LIDOCAINE HCL (PF) 1 % IJ SOLN
INTRAMUSCULAR | Status: AC
  Filled 2018-11-23: qty 30

## 2018-11-23 MED ORDER — SODIUM CHLORIDE 0.9% FLUSH
3.0000 mL | Freq: Two times a day (BID) | INTRAVENOUS | Status: DC
  Administered 2018-11-24: 3 mL via INTRAVENOUS

## 2018-11-23 MED ORDER — MIDAZOLAM HCL 2 MG/2ML IJ SOLN
INTRAMUSCULAR | Status: DC | PRN
  Administered 2018-11-23 (×2): 1 mg via INTRAVENOUS

## 2018-11-23 SURGICAL SUPPLY — 16 items
CATH EXPO 5F MPA-1 (CATHETERS) ×2 IMPLANT
CATH INFINITI 5 FR IM (CATHETERS) ×2 IMPLANT
CATH INFINITI 5 FR RCB (CATHETERS) ×2 IMPLANT
CATH INFINITI 5FR MULTPACK ANG (CATHETERS) ×2 IMPLANT
CATH SWAN GANZ 7F STRAIGHT (CATHETERS) ×2 IMPLANT
KIT HEART LEFT (KITS) ×2 IMPLANT
PACK CARDIAC CATHETERIZATION (CUSTOM PROCEDURE TRAY) ×2 IMPLANT
SHEATH PINNACLE 5F 10CM (SHEATH) ×2 IMPLANT
SHEATH PINNACLE 7F 10CM (SHEATH) ×2 IMPLANT
SHEATH PROBE COVER 6X72 (BAG) ×2 IMPLANT
SYR MEDRAD MARK 7 150ML (SYRINGE) ×2 IMPLANT
TRANSDUCER W/STOPCOCK (MISCELLANEOUS) ×2 IMPLANT
TUBING CIL FLEX 10 FLL-RA (TUBING) ×2 IMPLANT
WIRE EMERALD 3MM-J .035X150CM (WIRE) ×2 IMPLANT
WIRE EMERALD 3MM-J .035X260CM (WIRE) ×2 IMPLANT
WIRE EMERALD ST .035X150CM (WIRE) ×2 IMPLANT

## 2018-11-23 NOTE — Consult Note (Addendum)
Advanced Heart Failure Team Consult Note   Primary Physician: Josetta Huddle, MD PCP-Cardiologist:  Dr Percival Spanish   Reason for Consultation: Heart Failure   HPI:    Stephen Adams is seen today for evaluation of heart failure at the request of heart failure.    Stephen Adams is a 73 year old with a history of ICM, chronic systolic heart failure, single chamber ICD (medtronic), CAD, 1st MI in his 41s, S/P CABG , HTN, hyperlipidemia, and AAA. Admitted with VT.   Prior to admit he was followed by Dr Percival Spanish and Stephen Adams and has been stable. He has been very active and on Sunday he was able to walk 2 miles without difficulty. Yesterday he woke up and had chest tightness and mild dyspnea.   Presented to [facility] ED on 2/11 with chest tightness and found to be in WCT at 140 bpm.  VT zone on ICD was set at >180.   ED course: Given diltiazem resulting in hypotension. Cardiology consulted and he was started on amiodarone and heparin drip. Also given lidocaine bolus and drip. BP continued to drop so he was sedated and cardioverted with 120 joules. Troponin trend 2.54>9.23>7.36. Pertinent labs included BNP 3214, creatinine 1.1, K 4., mag 1.7.   CXR concerning for mild edema. Admitted and set up for cath. Cath today showed patent grafts, severe stenosis in LCx, and severely reduced EF.   Echotoday- EF 20-25% RV normal  ECHO 06/2018 EF 25-30% RV severely dilated and moderately reduced.   LHC/RHC  11/23/18  1. Severe 3 vessel obstructive CAD 2. Patent LIMA to the LAD 3. Patent SVG to a large second diagonal. There is 40% stenosis at the graft anastomosis 4. Patent SVG to a ramus intermediate branch. There is an eccentric 50% stenosis in the proximal SVG 5. Patent SVG to the RCA, this graft is has diffuse mild disease and is ectatic. 6. The LCx has a severe stenosis in the proximal to mid vessel supplying an OM. There is an acute angulation of the LCx from the left main 7. Mildly elevated LV filling  pressures with large V wave. 8. Mild to moderate pulmonary HTN 9. Severe LV dysfunction. EF is estimated at 15% and LV is severely dilated. 10. Mild Aortic stenosis by cath. Gradient is only 5 mm Hg. This may underestimate his valve severity due to low EF.   RA 8 PCWP 22 PA 52/20 (31)  PAPi 4 PVR 1.5 Fick CO/CI 5.9/2.7   Review of Systems: [y] = yes, [ ]  = no   . General: Weight gain [ ] ; Weight loss [ ] ; Anorexia [ ] ; Fatigue [Y ]; Fever [ ] ; Chills [ ] ; Weakness [ ]   . Cardiac: Chest pain/pressure [Y ]; Resting SOB [ ] ; Exertional SOB [Y ]; Orthopnea [ ] ; Pedal Edema [ ] ; Palpitations [ ] ; Syncope [ ] ; Presyncope [ ] ; Paroxysmal nocturnal dyspnea[ ]   . Pulmonary: Cough [ ] ; Wheezing[ ] ; Hemoptysis[ ] ; Sputum [ ] ; Snoring [ ]   . GI: Vomiting[ ] ; Dysphagia[ ] ; Melena[ ] ; Hematochezia [ ] ; Heartburn[ ] ; Abdominal pain [ ] ; Constipation [ ] ; Diarrhea [ ] ; BRBPR [ ]   . GU: Hematuria[ ] ; Dysuria [ ] ; Nocturia[ ]   . Vascular: Pain in legs with walking [ ] ; Pain in feet with lying flat [ ] ; Non-healing sores [ ] ; Stroke [ ] ; TIA [ ] ; Slurred speech [ ] ;  . Neuro: Headaches[ ] ; Vertigo[ ] ; Seizures[ ] ; Paresthesias[ ] ;Blurred vision [ ] ; Diplopia [ ] ;  Vision changes [ ]   . Ortho/Skin: Arthritis [ ] ; Joint pain [Y ]; Muscle pain [ ] ; Joint swelling [ ] ; Back Pain [Y ]; Rash [ ]   . Psych: Depression[ ] ; Anxiety[ ]   . Heme: Bleeding problems [ ] ; Clotting disorders [ ] ; Anemia [ ]   . Endocrine: Diabetes [ ] ; Thyroid dysfunction[ ]   Home Medications Prior to Admission medications   Medication Sig Start Date End Date Taking? Authorizing Provider  aspirin 81 MG tablet Take 81 mg by mouth daily.   Yes [provider]  atorvastatin (LIPITOR) 20 MG tablet TAKE 1 TABLET BY MOUTH EVERY DAY Patient taking differently: Take 20 mg by mouth daily at 6 PM.  08/15/18  Yes Minus Breeding, MD  carvedilol (COREG) 6.25 MG tablet Take 0.5 tablets (3.125 mg total) by mouth 2 (two) times daily. 08/17/18   Yes Baldwin Jamaica, PA-C  ENTRESTO 97-103 MG TAKE 1 TABLET BY MOUTH 2 (TWO) TIMES DAILY Patient taking differently: Take 1 tablet by mouth 2 (two) times daily.  08/15/18  Yes Minus Breeding, MD  furosemide (LASIX) 20 MG tablet Take 1 tablet (20 mg total) by mouth daily. [place] for #90 days if insurance is better covered Patient taking differently: Take 20 mg by mouth daily.  08/25/18  Yes Lendon Colonel, NP  potassium chloride SA (KLOR-CON M20) 20 MEQ tablet Take 1 tablet (20 mEq total) by mouth daily. Patient taking differently: Take 20 mEq by mouth every evening.  07/22/18 11/22/18 Yes Minus Breeding, MD    Past Medical History: Past Medical History:  Diagnosis Date  . CAD (coronary artery disease)    a. 1989 PTCA LAD; b. 1999 BMS to LAD & RCA; c. 2000 PTCA RCA 2/2 ISR; d. 10/1999 Inf MI/VF Arrest: 4 stents to RCA 2/2 ISR; e. 05/2001 Cath: ISR RCA, sev LAD dzs, EF 35-40%-->CABG (details unknown); f. 09/2013 MV: Inferior infarct, no ischemia.  . Chronic systolic CHF (congestive heart failure) ([place])    a. 02/2015 Echo: EF 25-30%.  . DJD (degenerative joint disease)   . Hyperlipidemia   . Hypertensive heart disease   . 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 10/2008 s/p MDT [place] DR single lead AICD. Ser # [id number] H.  . Ischemic cardiomyopathy    a. 02/2015 Echo: EF 25-30%, inf, apical AK, mid-apical-anteroseptal and ant HK, Gr1 DD, mild AI, mod dil LA.  . Subluxation of interphalangeal joint of lesser toe   . Subluxation of metatarsophalangeal joint of lesser toe     Past Surgical History: Past Surgical History:  Procedure Laterality Date  . CORONARY ARTERY BYPASS GRAFT     4 vessel [employer].  Dr. Burns Spain 06/2000  . EP IMPLANTABLE DEVICE N/A 05/05/2016   Procedure: ICD Generator Changeout;  Surgeon: Evans Lance, MD;  Location: [place] [facility];  Service: Cardiovascular;  Laterality: N/A;  . TOTAL KNEE ARTHROPLASTY Left   . TOTAL SHOULDER  REPLACEMENT Bilateral     Family History: Family History  Problem Relation Age of Onset  . Hypertension Father   . Heart Problems Father        bypass 1992  . CAD Mother   . Pancreatic cancer Mother   . Heart Problems Maternal Grandfather     Social History: Social History   Socioeconomic History  . Marital status: Married    Spouse name: Not on file  . Number of children: 4  . Years of education: Not on file  . Highest education level:  Not on file  Occupational History  . Occupation: [employer]  . Financial resource strain: Not on file  . Food insecurity:    Worry: Not on file    Inability: Not on file  . Transportation needs:    Medical: Not on file    Non-medical: Not on file  Tobacco Use  . Smoking status: Never Smoker  . Smokeless tobacco: Never Used  Substance and Sexual Activity  . Alcohol use: Yes    Alcohol/week: 0.0 standard drinks    Comment: occas  . Drug use: No  . Sexual activity: Not on file  Lifestyle  . Physical activity:    Days per week: Not on file    Minutes per session: Not on file  . Stress: Not on file  Relationships  . Social connections:    Talks on phone: Not on file    Gets together: Not on file    Attends religious service: Not on file    Active member of club or organization: Not on file    Attends meetings of clubs or organizations: Not on file    Relationship status: Not on file  Other Topics Concern  . 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      Allergies:  Allergies  Allergen Reactions  . Oxycodone-Acetaminophen Nausea Only    Objective:    Vital Signs:   Temp:  [97.6 F (36.4 C)-98.2 F (36.8 C)] 97.6 F (36.4 C) (02/12 1153) Pulse Rate:  [39-97] 48 (02/12 1050) Resp:  [10-20] 20 (02/12 1050) BP: (90-138)/(55-90) 119/77 (02/12 1050) SpO2:  [92 %-100 %] 95 % (02/12 1050) Last BM Date: (PTA)  Weight change: Filed Weights   11/22/18 1100    Weight: 92 kg    Intake/Output:   Intake/Output Summary (Last 24 hours) at 11/23/2018 1244 Last data filed at 11/23/2018 0700 Gross per 24 hour  Intake 1272.47 ml  Output 975 ml  Net 297.47 ml      Physical Exam    General: NAD, No resp difficulty HEENT: normal Neck: supple. JVP 5-6 . Carotids 2+ bilat; no bruits. No lymphadenopathy or thyromegaly appreciated. Cor: PMI nondisplaced. Regular rate & rhythm. No rubs, gallops or murmurs. Lungs: clear Abdomen: soft, nontender, nondistended. No hepatosplenomegaly. No bruits or masses. Good bowel sounds. Extremities: no cyanosis, clubbing, rash, edema Neuro: alert & orientedx3, cranial nerves grossly intact. moves all 4 extremities w/o difficulty. Affect pleasant   Telemetry   Sinus Brady 40 with occasional PVCs.   EKG   VT 140 bpm in the ED.  Labs   Basic Metabolic Panel: Recent Labs  Lab 11/22/18 0856 11/22/18 1534 11/23/18 0418  NA 140  --  134*  K 4.6  --  4.1  CL 103  --  102  CO2 24  --  23  GLUCOSE 121*  --  95  BUN 12  --  13  CREATININE 1.13  --  0.87  CALCIUM 9.1  --  8.6*  MG  --  1.7 2.0    Liver Function Tests: Recent Labs  Lab 11/22/18 0922  AST 25  ALT 19  ALKPHOS 61  BILITOT 1.4*  PROT 6.7  ALBUMIN 4.0   No results for input(s): LIPASE, AMYLASE in the last 168 hours. No results for input(s): AMMONIA in the last 168 hours.  CBC: Recent Labs  Lab 11/22/18 0856 11/23/18 0418  WBC 4.4 5.1  HGB 14.8 13.0  HCT 45.9 38.2*  MCV 95.6 92.7  PLT 169 142*    Cardiac Enzymes: Recent Labs  Lab 11/22/18 1534 11/22/18 2153 11/23/18 0418  TROPONINI 2.54* 9.23* 7.36*    BNP: BNP (last 3 results) Recent Labs    07/07/18 1107  BNP 3,214.6*    ProBNP (last 3 results) No results for input(s): PROBNP in the last 8760 hours.   CBG: No results for input(s): GLUCAP in the last 168 hours.  Coagulation Studies: No results for input(s): LABPROT, INR in the last 72 hours.   Imaging     No results found.   Medications:     Current Medications: . aspirin  324 mg Oral Once  . aspirin  81 mg Oral Daily  . atorvastatin  80 mg Oral q1800  . carvedilol  3.125 mg Oral BID  . clopidogrel  600 mg Oral Once  . [START ON 11/24/2018] clopidogrel  75 mg Oral Daily  . metoprolol tartrate  5 mg Intravenous Once  . sacubitril-valsartan  1 tablet Oral BID  . sodium chloride flush  3 mL Intravenous Q12H     Infusions: . sodium chloride 10 mL/hr at 11/23/18 0700  . sodium chloride    . amiodarone 30 mg/hr (11/23/18 1129)  . heparin    . lidocaine 1 mg/min (11/23/18 0700)       Patient Profile   Stephen Adams is a 62 year old with a history of IMD, chronic systolic heart failure, single chamber ICD (medtronic), CAD, S/P CABG , HTN, hyperlipidemia, and AAA. Admitted with VT and chest pain.   Assessment/Plan   1.VT VT on admit 140 bpm. VT zone set at 180.  - Cardioverted in the ED. On amiodarone drip at 30 mg per hour + lidocaine drip 1 mg/min Has medtronic ICD.   2. A/C Systolic Heart Failure  ECHO 2019 EF 25-30% . ECHO completed today Ef 20-25% RV ok. Marland Kitchen  RHC with adequate cardiac output. Does not need inotropes.  -  Volume status stable.  - Stop carvedilol with bradycardia.  - Continue entresto 97-103 twice a day. Anticipate adding spironolactone tomorrow.   - Set outpatient CPX test in few weeks.   3. CAD S/P CABG - Troponin 2.5>9.2>7.36  -LHC today with severe coronary disease/patent grafts, and severe stenosis LCx.  -On statin, asa, and plavix Possible PCI on Friday. Dr Haroldine Laws will discuss with interventional team.   4. LBBB Dr Harrell Gave discussed with EP possible device upgrade.   5. Hyperlipidemia On statin  6. AAA  Medication concerns reviewed with patient and pharmacy team. Barriers identified: no  Length of Stay: 1  Amy Clegg, NP  11/23/2018, 12:44 PM  Advanced Heart Failure Team Pager [phone] (M-F; 7a - 4p)  Please contact [facility] for night-coverage after hours (4p -7a ) and weekends on [email or web address]  Patient seen and examined with the above-signed Advanced Practice Provider and/or Housestaff. I personally reviewed laboratory data, imaging studies and relevant notes. I independently examined the patient and formulated the important aspects of the plan. I have edited the note to reflect any of my changes or salient points. I have personally discussed the plan with the patient and/or family.  73 y/o male with ischemic CM EF ~25% s/p CABG admitted with VT and subsequent NSTEMI.   Has been very active at baseline without anginal symptoms or significant HF symptoms. NYHA I-II. Woke up with VTand came to ER. Subsequently underwent DC-CV. Cath today with patent grafts  and significant lesion in native LCX. Cardiac output preserved on RHC, Has LBBB on ECG.  Based on history, I think he had spontaneous VT and not necessarily electro-mechanical VT in setting of worsening HF. RHC is reassuring. At this point likely will not need advanced therapies but I do think it may be worthwhile to consider CRT upgrade for possible survival benefit if nothing else. Will review cath films with interventional team to see if PCI of LCX would really benefit him.   On exam volume status looks good. No s3.  No driving x 6 months.   Glori Bickers, MD  11:58 PM

## 2018-11-23 NOTE — Progress Notes (Signed)
Site area: Scientific laboratory technician Prior to Removal:  Level O Pressure Applied For: 57min Manual:   Yes Patient Status During Pull:  A/O Post Pull Site:  Level O Post Pull Instructions Given:  Post instructions given and pt understands. Post Pull Pulses Present: Rt pt 2+/ Rt dp doppler Dressing Applied: Yes   Tegaderm and a 4x4 Bedrest begins: 10:50:00 Comments: Pt leaves cath lab holding area in stable condition. Rt groin is unremarkable. Dressing is CDI.

## 2018-11-23 NOTE — Progress Notes (Signed)
ANTICOAGULATION CONSULT NOTE  Pharmacy Consult: Heparin Indication: atrial fibrillation  Patient Measurements: Heparin Dosing Weight: 92 kg  Vital Signs: Temp: 97.7 F (36.5 C) (02/12 0808) Temp Source: Oral (02/12 0808) BP: 119/77 (02/12 1050) Pulse Rate: 48 (02/12 1050)  Labs: Recent Labs    11/22/18 0856 11/22/18 1534 11/22/18 2153 11/22/18 2357 11/23/18 0418  HGB 14.8  --   --   --  13.0  HCT 45.9  --   --   --  38.2*  PLT 169  --   --   --  142*  HEPARINUNFRC  --  0.33  --  0.56 0.55  CREATININE 1.13  --   --   --  0.87  TROPONINI  --  2.54* 9.23*  --  7.36*     Assessment: 51 YOM presented with chest pain now s/p cath with plans for possible PCI. Pharmacy consulted to continue heparin post cath.  Prior heparin rate was 1400 units/hr with therapeutic heparin levels   Goal of Therapy:  Heparin level 0.3-0.7 units/ml Monitor platelets by anticoagulation protocol: Yes    Plan:  Resume heparin at 1400 units/hr at 6pm ( 8 hours post sheath removal) Heparin level in 6 hours and daily wth CBC daily  Hildred Laser, PharmD Clinical Pharmacist **Pharmacist phone directory can now be found on [email or web address] ([email or web address]).  Listed under [place].

## 2018-11-23 NOTE — Progress Notes (Addendum)
Progress Note  Patient Name: Stephen Adams Date of Encounter: 11/23/2018  Primary Cardiologist: Dr. Percival Spanish Primary EP: Dr. Lovena Le  Subjective   Feeling well this AM, other than being tired from constant monitor alarms overnight. No further chest pressure. Reviewed plans for workup at length today, see below.  Inpatient Medications    Scheduled Meds: . [MAR Hold] aspirin  324 mg Oral Once  . [MAR Hold] aspirin  81 mg Oral Daily  . [MAR Hold] atorvastatin  20 mg Oral q1800  . [MAR Hold] carvedilol  3.125 mg Oral BID  . [MAR Hold] metoprolol tartrate  5 mg Intravenous Once  . [MAR Hold] sacubitril-valsartan  1 tablet Oral BID  . sodium chloride flush  3 mL Intravenous Q12H   Continuous Infusions: . sodium chloride 10 mL/hr at 11/23/18 0700  . sodium chloride    . amiodarone 30 mg/hr (11/23/18 0700)  . heparin 1,400 Units/hr (11/23/18 0700)  . lidocaine 1 mg/min (11/23/18 0700)   PRN Meds: fentaNYL, midazolam, sodium chloride flush   Vital Signs    Vitals:   11/23/18 0600 11/23/18 0700 11/23/18 0808 11/23/18 0835  BP: 107/82 119/88    Pulse: (!) 48 (!) 44    Resp: 17 13    Temp:   97.7 F (36.5 C)   TempSrc:   Oral   SpO2: 96% 98%  98%  Weight:      Height:        Intake/Output Summary (Last 24 hours) at 11/23/2018 0839 Last data filed at 11/23/2018 0700 Gross per 24 hour  Intake 1272.47 ml  Output 975 ml  Net 297.47 ml   Last 3 Weights 11/22/2018 08/25/2018 08/17/2018  Weight (lbs) 202 lb 13.2 oz 203 lb 9.6 oz 205 lb  Weight (kg) 92 kg 92.352 kg 92.987 kg      Telemetry    Both paced rhythm and intrinsic sinus bradycardia, with rates in the 40s - Personally Reviewed  ECG    No new since yesterday - Personally Reviewed  Physical Exam   GEN: No acute distress.   Neck: No JVD Cardiac: RRR, no murmurs, rubs, or gallops.  Respiratory: Clear to auscultation bilaterally. GI: Soft, nontender, non-distended  MS: No edema; No deformity. Neuro:   Nonfocal  Psych: Normal affect   Labs    Chemistry Recent Labs  Lab 11/22/18 0856 11/22/18 0922 11/23/18 0418  NA 140  --  134*  K 4.6  --  4.1  CL 103  --  102  CO2 24  --  23  GLUCOSE 121*  --  95  BUN 12  --  13  CREATININE 1.13  --  0.87  CALCIUM 9.1  --  8.6*  PROT  --  6.7  --   ALBUMIN  --  4.0  --   AST  --  25  --   ALT  --  19  --   ALKPHOS  --  61  --   BILITOT  --  1.4*  --   GFRNONAA >60  --  >60  GFRAA >60  --  >60  ANIONGAP 13  --  9     Hematology Recent Labs  Lab 11/22/18 0856 11/23/18 0418  WBC 4.4 5.1  RBC 4.80 4.12*  HGB 14.8 13.0  HCT 45.9 38.2*  MCV 95.6 92.7  MCH 30.8 31.6  MCHC 32.2 34.0  RDW 13.8 14.1  PLT 169 142*    Cardiac Enzymes Recent Labs  Lab 11/22/18 1534  11/22/18 2153 11/23/18 0418  TROPONINI 2.54* 9.23* 7.36*    Recent Labs  Lab 11/22/18 0852  TROPIPOC 0.00     BNPNo results for input(s): BNP, PROBNP in the last 168 hours.   DDimer No results for input(s): DDIMER in the last 168 hours.   Radiology    Dg Chest Port 1 View  Result Date: 11/22/2018 CLINICAL DATA:  Chest pain since 0700 today. Hx of ischemic cardiomyopathy, hypertensive heart disease, chronic systolic CHF, coronary artery disease, CABG x4(2001), defibrillator(2017). Nonsmoker. EXAM: PORTABLE CHEST 1 VIEW COMPARISON:  01/28/2018 FINDINGS: Patient has LEFT-sided AICD with lead to the RIGHT ventricle. Median sternotomy and CABG. The heart is enlarged. There is pulmonary vascular congestion and mild interstitial pulmonary edema. No consolidations. Previous bilateral shoulder arthroplasty. IMPRESSION: Cardiomegaly and mild interstitial edema. Electronically Signed   By: Nolon Nations M.D.   On: 11/22/2018 09:49    Cardiac Studies   Cath, echo pending  Patient Profile     73 y.o. male with ischemic cardiomyopathy, EF 25-30% 06/2018 s/p single chamber medtronic ICD, CAD (first MI in his early 22s) s/p CABG, HTN, HLD, AAA who was seen in the ER at  the request of Dr. Maryan Rued for arrhythmia, found to be in VT.   Assessment & Plan    Ventricular tachycardia, sustained:  -initially treated with amiodarone and lidocaine, and when his blood pressure began to drop, decision was made and confirmed by patient to proceed with cardioversion of his VT 2/11 in ER. This successfully converted him to a V paced rhythm -unclear trigger, but concern is for ischemia given his history. No recent illnesses, electrolytes normal, no recent med changes. -will continue heparin, amiodarone, and lidocaine pending cath results. Continue beta blocker. -we discussed at length the plan for workup for this. Will pursue cath to look for treatable ischemia. Depending on results, will discuss medical therapy options for VT. May also need to talk to the EP team about CRT-D upgrade at some point if he remains bradycardic, especially if beta blocker and amiodarone needed for VT. Tried to avoid this in the past as he had a relatively recent gen change.  CAD, s/p CABG: concern for ischemia as cause -continue aspirin, atorvastatin -on heparin given concern for ischemia as cause, troponins peaked and now downtrending  Ischemic cardiomyopathy with chronic systolic heart failure -continue carvedilol, entresto  ADDENDUM: Discussed results with Dr. Martinique. Will load with P2Y12 today. Echo pending. Will ask advanced HF to evaluate as well, given that he is on max entresto and unlikely that PCI to LCx will significantly improve his function (though hopefully it will decrease likelihood of VT recurrence).  CRITICAL CARE Patient is critically ill with multiple organ systems affected and requires high complexity decision making. Total critical care time: 35 minutes. This time includes gathering of history, evaluation of patient's response to treatment, examination of patient, review of laboratory and imaging studies, and coordination with consultants.   For questions or updates,  please contact [place] Please consult [email or web address] for contact info under     Signed, Buford Dresser, MD  11/23/2018, 8:39 AM

## 2018-11-23 NOTE — Progress Notes (Signed)
ANTICOAGULATION CONSULT NOTE - Follow Up Consult  Pharmacy Consult for heparin Indication: atrial fibrillation  Labs: Recent Labs    11/22/18 0856 11/22/18 1534 11/22/18 2153 11/22/18 2357  HGB 14.8  --   --   --   HCT 45.9  --   --   --   PLT 169  --   --   --   HEPARINUNFRC  --  0.33  --  0.56  CREATININE 1.13  --   --   --   TROPONINI  --  2.54* 9.23*  --     Assessment/Plan:  72yo male therapeutic on heparin after rate change. Will continue gtt at current rate and confirm stable with am labs.   Wynona Neat, PharmD, BCPS  11/23/2018,12:25 AM

## 2018-11-23 NOTE — Progress Notes (Signed)
  Echocardiogram 2D Echocardiogram has been performed.  Stephen Adams 11/23/2018, 2:30 PM

## 2018-11-23 NOTE — Interval H&P Note (Signed)
History and Physical Interval Note:  11/23/2018 8:26 AM  Stephen Adams  has presented today for surgery, with the diagnosis of vt  The various methods of treatment have been discussed with the patient and family. After consideration of risks, benefits and other options for treatment, the patient has consented to  Procedure(s): LEFT HEART CATH AND CORS/GRAFTS ANGIOGRAPHY (N/A) as a surgical intervention .  The patient's history has been reviewed, patient examined, no change in status, stable for surgery.  I have reviewed the patient's chart and labs.  Questions were answered to the patient's satisfaction.   Cath Lab Visit (complete for each Cath Lab visit)  Clinical Evaluation Leading to the Procedure:   ACS: Yes.    Non-ACS:    Anginal Classification: CCS II  Anti-ischemic medical therapy: Maximal Therapy (2 or more classes of medications)  Non-Invasive Test Results: No non-invasive testing performed  Prior CABG: Previous CABG        Stephen Adams [place] MD,FACC 11/23/2018 8:26 AM

## 2018-11-24 ENCOUNTER — Other Ambulatory Visit: Payer: Self-pay

## 2018-11-24 DIAGNOSIS — I5022 Chronic systolic (congestive) heart failure: Secondary | ICD-10-CM

## 2018-11-24 DIAGNOSIS — I214 Non-ST elevation (NSTEMI) myocardial infarction: Principal | ICD-10-CM

## 2018-11-24 DIAGNOSIS — I2510 Atherosclerotic heart disease of native coronary artery without angina pectoris: Secondary | ICD-10-CM

## 2018-11-24 DIAGNOSIS — Z951 Presence of aortocoronary bypass graft: Secondary | ICD-10-CM

## 2018-11-24 LAB — BASIC METABOLIC PANEL
Anion gap: 9 (ref 5–15)
BUN: 10 mg/dL (ref 8–23)
CO2: 20 mmol/L — ABNORMAL LOW (ref 22–32)
Calcium: 8.1 mg/dL — ABNORMAL LOW (ref 8.9–10.3)
Chloride: 104 mmol/L (ref 98–111)
Creatinine, Ser: 0.9 mg/dL (ref 0.61–1.24)
GFR calc Af Amer: >60 mL/min (ref >60)
GFR calc non Af Amer: >60 mL/min (ref >60)
Glucose, Bld: 115 mg/dL — ABNORMAL HIGH (ref 70–99)
Potassium: 4.2 mmol/L (ref 3.5–5.1)
Sodium: 133 mmol/L — ABNORMAL LOW (ref 135–145)

## 2018-11-24 LAB — CBC
HCT: 40.6 % (ref 39.0–52.0)
Hemoglobin: 13.1 g/dL (ref 13.0–17.0)
MCH: 30.4 pg (ref 26.0–34.0)
MCHC: 32.3 g/dL (ref 30.0–36.0)
MCV: 94.2 fL (ref 80.0–100.0)
Platelets: 136 10*3/uL — ABNORMAL LOW (ref 150–400)
RBC: 4.31 MIL/uL (ref 4.22–5.81)
RDW: 13.9 % (ref 11.5–15.5)
WBC: 6.3 10*3/uL (ref 4.0–10.5)
nRBC: 0 % (ref 0.0–0.2)

## 2018-11-24 LAB — HEPARIN LEVEL (UNFRACTIONATED)
Heparin Unfractionated: 0.24 IU/mL — ABNORMAL LOW (ref 0.30–0.70)
Heparin Unfractionated: 0.35 IU/mL (ref 0.30–0.70)
Heparin Unfractionated: 0.63 IU/mL (ref 0.30–0.70)

## 2018-11-24 LAB — LIDOCAINE LEVEL: Lidocaine Lvl: 2.1 ug/mL (ref 1.5–5.0)

## 2018-11-24 LAB — MAGNESIUM: Magnesium: 1.6 mg/dL — ABNORMAL LOW (ref 1.7–2.4)

## 2018-11-24 MED ORDER — MAGNESIUM SULFATE 50 % IJ SOLN
3.0000 g | Freq: Once | INTRAVENOUS | Status: AC
  Administered 2018-11-24: 3 g via INTRAVENOUS
  Filled 2018-11-24: qty 6

## 2018-11-24 MED ORDER — MAGNESIUM SULFATE IN D5W 1-5 GM/100ML-% IV SOLN
1.0000 g | Freq: Once | INTRAVENOUS | Status: DC
  Filled 2018-11-24: qty 100

## 2018-11-24 NOTE — Progress Notes (Addendum)
Advanced Heart Failure Rounding Note  PCP-Cardiologist: No primary care provider on file.   Subjective:   VT on admit. Remains on lidocaine and amiodarone 30 mg per hour. Having PVCs but no VT  Denies chest pain. Denies dyspnea.   LHC/RHC  11/23/18  1. Severe 3 vessel obstructive CAD 2. Patent LIMA to the LAD 3. Patent SVG to a large second diagonal. There is 40% stenosis at the graft anastomosis 4. Patent SVG to a ramus intermediate branch. There is an eccentric 50% stenosis in the proximal SVG 5. Patent SVG to the RCA, this graft is has diffuse mild disease and is ectatic. 6. The LCx has a severe stenosis in the proximal to mid vessel supplying an OM. There is an acute angulation of the LCx from the left main 7. Mildly elevated LV filling pressures with large V wave. 8. Mild to moderate pulmonary HTN 9. Severe LV dysfunction. EF is estimated at 15% and LV is severely dilated. 10. Mild Aortic stenosis by cath. Gradient is only 5 mm Hg. This may underestimate his valve severity due to low EF.  RA 8 PCWP 22 PA 52/20 (31)  PAPi 4 PVR 1.5 Fick CO/CI 5.9/2.7  Objective:   Weight Range: 92 kg Body mass index is 28.29 kg/m.   Vital Signs:   Temp:  [97.6 F (36.4 C)-98.8 F (37.1 C)] 98.8 F (37.1 C) (02/13 0400) Pulse Rate:  [39-97] 56 (02/13 0700) Resp:  [10-23] 22 (02/13 0700) BP: (79-138)/(49-91) 108/76 (02/13 0700) SpO2:  [88 %-100 %] 95 % (02/13 0700) Last BM Date: (PTA)  Weight change: Filed Weights   11/22/18 1100  Weight: 92 kg    Intake/Output:   Intake/Output Summary (Last 24 hours) at 11/24/2018 0817 Last data filed at 11/24/2018 0600 Gross per 24 hour  Intake 961.76 ml  Output 1850 ml  Net -888.24 ml      Physical Exam    General:  Well appearing. No resp difficulty HEENT: normal Neck: supple. no JVD. Carotids 2+ bilat; no bruits. No lymphadenopathy or thryomegaly appreciated. Cor: PMI nondisplaced. Regular rate & rhythm. No rubs, gallops or  murmurs. Lungs: clear Abdomen: soft, nontender, nondistended. No hepatosplenomegaly. No bruits or masses. Good bowel sounds. Extremities: no cyanosis, clubbing, rash, edema Neuro: alert & orientedx3, cranial nerves grossly intact. moves all 4 extremities w/o difficulty. Affect pleasant   Telemetry   SR 60s +PVCs No VT Personally reviewed   EKG    N/A   Labs    CBC Recent Labs    11/23/18 0418  11/23/18 0850 11/24/18 0533  WBC 5.1  --   --  6.3  HGB 13.0   < > 13.9 13.1  HCT 38.2*   < > 41.0 40.6  MCV 92.7  --   --  94.2  PLT 142*  --   --  136*   < > = values in this interval not displayed.   Basic Metabolic Panel Recent Labs    11/23/18 0418  11/23/18 0850 11/24/18 0533  NA 134*   < > 136 133*  K 4.1   < > 4.0 4.2  CL 102  --   --  104  CO2 23  --   --  20*  GLUCOSE 95  --   --  115*  BUN 13  --   --  10  CREATININE 0.87  --   --  0.90  CALCIUM 8.6*  --   --  8.1*  MG 2.0  --   --  1.6*   < > = values in this interval not displayed.   Liver Function Tests Recent Labs    11/22/18 0922  AST 25  ALT 19  ALKPHOS 61  BILITOT 1.4*  PROT 6.7  ALBUMIN 4.0   No results for input(s): LIPASE, AMYLASE in the last 72 hours. Cardiac Enzymes Recent Labs    11/22/18 1534 11/22/18 2153 11/23/18 0418  TROPONINI 2.54* 9.23* 7.36*    BNP: BNP (last 3 results) Recent Labs    07/07/18 1107  BNP 3,214.6*    ProBNP (last 3 results) No results for input(s): PROBNP in the last 8760 hours.   D-Dimer No results for input(s): DDIMER in the last 72 hours. Hemoglobin A1C No results for input(s): HGBA1C in the last 72 hours. Fasting Lipid Panel No results for input(s): CHOL, HDL, LDLCALC, TRIG, CHOLHDL, LDLDIRECT in the last 72 hours. Thyroid Function Tests No results for input(s): TSH, T4TOTAL, T3FREE, THYROIDAB in the last 72 hours.  Invalid input(s): FREET3  Other results:   Imaging     No results found.   Medications:     Scheduled  Medications: . aspirin  324 mg Oral Once  . aspirin  81 mg Oral Daily  . atorvastatin  80 mg Oral q1800  . clopidogrel  75 mg Oral Daily  . metoprolol tartrate  5 mg Intravenous Once  . sacubitril-valsartan  1 tablet Oral BID  . sodium chloride flush  3 mL Intravenous Q12H     Infusions: . sodium chloride 10 mL/hr at 11/24/18 0600  . sodium chloride    . amiodarone 30 mg/hr (11/24/18 0600)  . heparin 1,400 Units/hr (11/24/18 0600)  . lidocaine 1 mg/min (11/24/18 0600)  . magnesium sulfate 1 - 4 g bolus IVPB       PRN Medications:  sodium chloride, sodium chloride flush    Patient Profile   Mr Anspach is a 73 year old with a history of IMD, chronic systolic heart failure, single chamber ICD (medtronic), CAD, S/P CABG , HTN, hyperlipidemia, and AAA. Admitted with VT and chest pain  Assessment/Plan   1.VT VT on admit 140 bpm. VT zone set at 180.  - Cardioverted in the ED 2/12. No further VT. .  -On amiodarone drip at 30 mg per hour + lidocaine drip 1 mg/min -Has medtronic ICD. - EP consulted today.   2. A/C Systolic Heart Failure  ECHO 2019 EF 25-30% . ECHO completed today Ef 20-25% RV ok. Marland Kitchen  RHC with adequate cardiac output. Does not need inotropes.  -  Volume status stable.  - Carvedilol was stopped due to bradycardia. .  - Continue entresto 97-103 twice a day. Anticipate adding spironolactone tomorrow.   - Set up outpatient CPX test in few weeks.   3. CAD with NSTEMI in setting of VT - S/P CABG - Troponin 2.5>9.2>7.36  -LHC 2/12 with severe coronary disease/patent grafts, and severe stenosis LCx.  -On statin, asa, and plavix -PCI on Friday. Dr Haroldine Laws will discuss with interventional team.   4. LBBB Dr Harrell Gave discussed with EP possible device upgrade.   5. Hyperlipidemia On statin  6. AAA  We will set up follow up in the HF clinic 4-6 weeks.   Medication concerns reviewed with patient and pharmacy team. Barriers identified: n/a    Length of Stay: 2  Darrick Grinder, NP  11/24/2018, 8:17 AM  Advanced Heart Failure Team Pager [phone] (M-F; 7a - 4p)  Please contact [facility] for night-coverage after hours (4p -  7a ) and weekends on [email or web address]  Patient seen and examined with Darrick Grinder, NP. We discussed all aspects of the encounter. I agree with the assessment and plan as stated above.   VT is quiescent on amio and lidocaine. No CP. HF volume status is stable. Output was preserved on RHC. I have reviewed coronary angios and agree with PCI as ischemia may be contributing to VT. Have discussed with EP and will consider CRT upgrade as an outpatient. Overall I feel HF is relatively stable and we can seeas outpatient with CPX for further risk stratification for advanced HF therapies. HF team will s/o. Please call with questions. D/w Dr. Harrell Gave.   Glori Bickers, MD  6:03 PM

## 2018-11-24 NOTE — Consult Note (Addendum)
Cardiology Consultation:   Patient ID: Stephen Adams MRN: [id number]; DOB: 18-Aug-1946  Admit date: 11/22/2018 Date of Consult: 11/24/2018  Primary Care Provider: Josetta Huddle, MD Primary Cardiologist: Dr. Percival Spanish Primary Electrophysiologist:  Dr. Lovena Le   Patient Profile:   Stephen Adams is a 73 y.o. male with a hx of CAD (prior CABG), ICM w.ICD, HTN, HLD, AAA, and chronic venous insufficiency (followed by Dr. Bridgett Larsson) who is being seen today for the evaluation of device upgrade at the request of Dr. Haroldine Laws.  Device information: MDT single chamber ICD, implanted 2010, gen change 05/05/16.  History of Present Illness:   Stephen Adams was admitted to [facility] 11/22/2018 with vague c/o feeling "off" did his morning routine, tended to his horses  Though still not feeling well, developed a pounding sensation, chest discomfort and came in when this persisted.  He was found in a WCT, there was some discussion of ?AF and treated with IV diltiazem, though after evaluation with cardiology felt to be VT (ICD was interrogated though a single chamber device, rate was below his 1st VT zone), though hemodynamically stable, initially treated with amio and lidocaine gtts, though with no change in rhythm/rate and some eventual trend downward of his BP he was sedated and cardioverted in the ER.  Trop peaked at 9.23, trending down  He has undergone cath with plans for PCI to the cx tomorrow, noting patent LIMA to LAD, patent SVG to 2nd OM, patent SVG to RI, and patent SVG to RCA, LVEF 15%, mildly elevated LV filling pressures, mild-mod p.HTN, and mild AS.  TTE noted LVEF 20-25%.  He has been bradycardic, with V pacing and his coreg stopped since here, he remains on amio and lido gtts  EKG today non-paced notes LBBB.  EP is asked to see the patient for consideration of upgrade to CRT device  LABS K+ 4.6 >. 4.2 Mag 1.7 > 1.6 (replaced today) BUN/Creat 12/1.13 >> 10/0.90 WBC 4.4 > 6.3 H/H 14/45 > 13/40 Plts  169 >136  Lidocaine level today is 2.1  The patient feels much better, no CP, palpitations or SOB.  He reports no symptoms the day prior to his admission.    Past Medical History:  Diagnosis Date  . CAD (coronary artery disease)    a. 1989 PTCA LAD; b. 1999 BMS to LAD & RCA; c. 2000 PTCA RCA 2/2 ISR; d. 10/1999 Inf MI/VF Arrest: 4 stents to RCA 2/2 ISR; e. 05/2001 Cath: ISR RCA, sev LAD dzs, EF 35-40%-->CABG (details unknown); f. 09/2013 MV: Inferior infarct, no ischemia.  . Chronic systolic CHF (congestive heart failure) ([place])    a. 02/2015 Echo: EF 25-30%.  . DJD (degenerative joint disease)   . Hyperlipidemia   . Hypertensive heart disease   . 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 10/2008 s/p MDT [place] DR single lead AICD. Ser # [id number] H.  . Ischemic cardiomyopathy    a. 02/2015 Echo: EF 25-30%, inf, apical AK, mid-apical-anteroseptal and ant HK, Gr1 DD, mild AI, mod dil LA.  . Subluxation of interphalangeal joint of lesser toe   . Subluxation of metatarsophalangeal joint of lesser toe     Past Surgical History:  Procedure Laterality Date  . CORONARY ARTERY BYPASS GRAFT     4 vessel [employer].  Dr. Burns Spain 06/2000  . EP IMPLANTABLE DEVICE N/A 05/05/2016   Procedure: ICD Generator Changeout;  Surgeon: Evans Lance, MD;  Location: [facility];  Service: Cardiovascular;  Laterality: N/A;  . RIGHT/LEFT  HEART CATH AND CORONARY/GRAFT ANGIOGRAPHY N/A 11/23/2018   Procedure: RIGHT/LEFT HEART CATH AND CORONARY/GRAFT ANGIOGRAPHY;  Surgeon: Martinique, Peter M, MD;  Location: [facility];  Service: Cardiovascular;  Laterality: N/A;  . TOTAL KNEE ARTHROPLASTY Left   . TOTAL SHOULDER REPLACEMENT Bilateral      Home Medications:  Prior to Admission medications   Medication Sig Start Date End Date Taking? Authorizing Provider  aspirin 81 MG tablet Take 81 mg by mouth daily.   Yes [provider]  atorvastatin (LIPITOR) 20 MG tablet TAKE  1 TABLET BY MOUTH EVERY DAY Patient taking differently: Take 20 mg by mouth daily at 6 PM.  08/15/18  Yes Minus Breeding, MD  carvedilol (COREG) 6.25 MG tablet Take 0.5 tablets (3.125 mg total) by mouth 2 (two) times daily. 08/17/18  Yes Baldwin Jamaica, PA-C  ENTRESTO 97-103 MG TAKE 1 TABLET BY MOUTH 2 (TWO) TIMES DAILY Patient taking differently: Take 1 tablet by mouth 2 (two) times daily.  08/15/18  Yes Minus Breeding, MD  furosemide (LASIX) 20 MG tablet Take 1 tablet (20 mg total) by mouth daily. [place] for #90 days if insurance is better covered Patient taking differently: Take 20 mg by mouth daily.  08/25/18  Yes Lendon Colonel, NP  potassium chloride SA (KLOR-CON M20) 20 MEQ tablet Take 1 tablet (20 mEq total) by mouth daily. Patient taking differently: Take 20 mEq by mouth every evening.  07/22/18 11/22/18 Yes Minus Breeding, MD    Inpatient Medications: Scheduled Meds: . aspirin  324 mg Oral Once  . aspirin  81 mg Oral Daily  . atorvastatin  80 mg Oral q1800  . clopidogrel  75 mg Oral Daily  . metoprolol tartrate  5 mg Intravenous Once  . sacubitril-valsartan  1 tablet Oral BID  . sodium chloride flush  3 mL Intravenous Q12H   Continuous Infusions: . sodium chloride 10 mL/hr at 11/24/18 1300  . sodium chloride    . amiodarone 30 mg/hr (11/24/18 1300)  . heparin 1,500 Units/hr (11/24/18 1300)  . lidocaine 1 mg/min (11/24/18 1300)   PRN Meds: sodium chloride, sodium chloride flush  Allergies:    Allergies  Allergen Reactions  . Oxycodone-Acetaminophen Nausea Only    Social History:   Social History   Socioeconomic History  . Marital status: Married    Spouse name: Not on file  . Number of children: 4  . Years of education: Not on file  . Highest education level: Not on file  Occupational History  . Occupation: Beef Cattle Butler  . Financial resource strain: Not on file  . Food insecurity:    Worry: Not on file    Inability: Not on file  .  Transportation needs:    Medical: Not on file    Non-medical: Not on file  Tobacco Use  . Smoking status: Never Smoker  . Smokeless tobacco: Never Used  Substance and Sexual Activity  . Alcohol use: Yes    Alcohol/week: 0.0 standard drinks    Comment: occas  . Drug use: No  . Sexual activity: Not on file  Lifestyle  . Physical activity:    Days per week: Not on file    Minutes per session: Not on file  . Stress: Not on file  Relationships  . Social connections:    Talks on phone: Not on file    Gets together: Not on file    Attends religious service: Not on file    Active  member of club or organization: Not on file    Attends meetings of clubs or organizations: Not on file    Relationship status: Not on file  . Intimate partner violence:    Fear of current or ex partner: Not on file    Emotionally abused: Not on file    Physically abused: Not on file    Forced sexual activity: Not on file  Other Topics Concern  . 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      Family History:   Family History  Problem Relation Age of Onset  . Hypertension Father   . Heart Problems Father        bypass 1992  . CAD Mother   . Pancreatic cancer Mother   . Heart Problems Maternal Grandfather      ROS:  Please see the history of present illness.  All other ROS reviewed and negative.     Physical Exam/Data:   Vitals:   11/24/18 1100 11/24/18 1152 11/24/18 1200 11/24/18 1300  BP: 100/80  95/67 110/74  Pulse: (!) 49  85 (!) 41  Resp: 20  (!) 25 17  Temp:  98.3 F (36.8 C)    TempSrc:  Oral    SpO2: 95%  97% 95%  Weight:      Height:        Intake/Output Summary (Last 24 hours) at 11/24/2018 1314 Last data filed at 11/24/2018 1300 Gross per 24 hour  Intake 1440.52 ml  Output 2400 ml  Net -959.48 ml   Last 3 Weights 11/22/2018 08/25/2018 08/17/2018  Weight (lbs) 202 lb 13.2 oz 203 lb 9.6 oz 205 lb  Weight (kg) 92 kg 92.352 kg 92.987  kg     Body mass index is 28.29 kg/m.  General:  Well nourished, well developed, in no acute distress HEENT: normal Lymph: no adenopathy Neck: no JVD Endocrine:  No thryomegaly Vascular: No carotid bruits  Cardiac:  RRR; no murmurs, gallops or rubs Lungs:  CTA b/l, no wheezing, rhonchi or rales  Abd: soft, nontender  Ext: no edema Musculoskeletal:  No deformities Skin: warm and dry  Neuro:  No gross focal abnormalities noted Psych:  Normal affect   EKG:  The EKG was personally reviewed and demonstrates:    #1 is VT, 142bpm #2 looks V paced, baseline artifact noted #3 is V paced (asynchronously) Today is non paced 56bpm, (one paced beat), LBBB, I do not appreciate atrial input (could be a retrograde P vs extremely long PR)  Telemetry:  Telemetry was personally reviewed and demonstrates:   Often V pacing, has intrinsic rhythm as well though routinely HR40's  Relevant CV Studies:  11/23/2018: R/LHC  Ost LAD to Prox LAD lesion is 90% stenosed.  Prox LAD to Mid LAD lesion is 100% stenosed.  Ost Ramus to Ramus lesion is 100% stenosed.  Prox Cx lesion is 95% stenosed.  Prox RCA lesion is 50% stenosed.  Mid RCA to Dist RCA lesion is 90% stenosed.  LIMA graft was visualized by angiography and is large.  The graft exhibits no disease.  SVG graft was visualized by angiography and is normal in caliber.  Origin to Prox Graft lesion is 50% stenosed.  SVG graft was visualized by angiography and is normal in caliber.  Insertion lesion is 40% stenosed.  SVG graft was visualized by angiography and is large.  The graft exhibits mild diffuse disease.  There is severe left  ventricular systolic dysfunction.  The left ventricular ejection fraction is less than 25% by visual estimate.  LV end diastolic pressure is mildly elevated.  There is mild aortic valve stenosis.  Hemodynamic findings consistent with mild pulmonary hypertension. 1. Severe 3 vessel obstructive CAD 2.  Patent LIMA to the LAD 3. Patent SVG to a large second diagonal. There is 40% stenosis at the graft anastomosis 4. Patent SVG to a ramus intermediate branch. There is an eccentric 50% stenosis in the proximal SVG 5. Patent SVG to the RCA, this graft is has diffuse mild disease and is ectatic. 6. The LCx has a severe stenosis in the proximal to mid vessel supplying an OM. There is an acute angulation of the LCx from the left main 7. Mildly elevated LV filling pressures with large V wave. 8. Mild to moderate pulmonary HTN 9. Severe LV dysfunction. EF is estimated at 15% and LV is severely dilated. 10. Mild Aortic stenosis by cath. Gradient is only 5 mm Hg. This may underestimate his valve severity due to low EF.   Plan: will discuss with primary team. Echo is pending. Consider PCI of the native LCx. I would anticipate this being challenging due to acute angulation of this vessel. If PCI planned will load with P2Y12 inhibitor.    11/23/2018: TTE IMPRESSIONS  1. The left ventricle has severely reduced systolic function, with an ejection fraction of 20-25%. The cavity size was mildly dilated. Left ventricular diastolic Doppler parameters are consistent with pseudonormalization.  2. There is akinesis / dyskinesis of the entire anteroseptal left ventricular segment.  3. The right ventricle has normal systolic function. The cavity was normal. There is no increase in right ventricular wall thickness. Right ventricular systolic pressure is moderately elevated.  4. Left atrial size was mildly dilated.  5. The mitral valve is normal in structure. There is mild thickening and mild calcification. Mitral valve regurgitation is mild to moderate by color flow Doppler.  6. The tricuspid valve is normal in structure.  7. The aortic valve was not assessed There is severe thickening and severe calcifcation of the aortic valve. There is assessment of the aortic valve is difficult . No gradient was recorded. of the  aortic valve.  8. The interatrial septum was not assessed.   07/07/18: TTE Study Conclusions - Left ventricle: The cavity size was mildly dilated. Systolic function was severely reduced. The estimated ejection fraction was in the range of 25% to 30%. Severe hypokinesis and scarring of the entireinferolateral, inferior, and inferoseptal myocardium; consistent with infarction in the distribution of the right coronary or left circumflex coronary artery. The study is not technically sufficient to allow evaluation of LV diastolic function due to incessant ventricular bigeminy.. No evidence of thrombus. - Aortic valve: Right coronary and noncoronary cusp mobility was moderately restricted. Transvalvular velocity was increased less than expected, due to low cardiac output. There was moderate stenosis. There was trivial regurgitation. Valve area (VTI): 1.35 cm^2. Valve area (Vmax): 1.02 cm^2. Valve area (Vmean): 1.22 cm^2. - Mitral valve: Calcified annulus. Mildly thickened leaflets . There was mild regurgitation. - Left atrium: The atrium was severely dilated. - Right ventricle: The cavity size was moderately to severely dilated. Systolic function was moderately reduced. - Right atrium: The atrium was severely dilated.  07/08/18: stress myoview  There was no ST segment deviation noted during stress.  Defect 1: There is a large defect of severe severity present in the basal anterior, basal anteroseptal, basal inferoseptal, basal inferior, mid anterior,  mid anteroseptal, mid inferoseptal, mid inferior, apical anterior, apical septal, apical inferior and apex location.  Findings consistent with prior myocardial infarction of the apex, anteroapical , entire inferior walls.  This is an intermediate risk study based on the LV dysfunction. There is no evidence of ischemia.  The study was not able to be gated. LV function information is not  available   Laboratory Data:  Chemistry Recent Labs  Lab 11/22/18 0856 11/23/18 0418 11/23/18 0847 11/23/18 0850 11/24/18 0533  NA 140 134* 136 136 133*  K 4.6 4.1 3.9 4.0 4.2  CL 103 102  --   --  104  CO2 24 23  --   --  20*  GLUCOSE 121* 95  --   --  115*  BUN 12 13  --   --  10  CREATININE 1.13 0.87  --   --  0.90  CALCIUM 9.1 8.6*  --   --  8.1*  GFRNONAA >60 >60  --   --  >60  GFRAA >60 >60  --   --  >60  ANIONGAP 13 9  --   --  9    Recent Labs  Lab 11/22/18 0922  PROT 6.7  ALBUMIN 4.0  AST 25  ALT 19  ALKPHOS 61  BILITOT 1.4*   Hematology Recent Labs  Lab 11/22/18 0856 11/23/18 0418 11/23/18 0847 11/23/18 0850 11/24/18 0533  WBC 4.4 5.1  --   --  6.3  RBC 4.80 4.12*  --   --  4.31  HGB 14.8 13.0 13.9 13.9 13.1  HCT 45.9 38.2* 41.0 41.0 40.6  MCV 95.6 92.7  --   --  94.2  MCH 30.8 31.6  --   --  30.4  MCHC 32.2 34.0  --   --  32.3  RDW 13.8 14.1  --   --  13.9  PLT 169 142*  --   --  136*   Cardiac Enzymes Recent Labs  Lab 11/22/18 1534 11/22/18 2153 11/23/18 0418  TROPONINI 2.54* 9.23* 7.36*    Recent Labs  Lab 11/22/18 0852  TROPIPOC 0.00    BNPNo results for input(s): BNP, PROBNP in the last 168 hours.  DDimer No results for input(s): DDIMER in the last 168 hours.  Radiology/Studies:   Dg Chest Port 1 View Result Date: 11/22/2018 CLINICAL DATA:  Chest pain since 0700 today. Hx of ischemic cardiomyopathy, hypertensive heart disease, chronic systolic CHF, coronary artery disease, CABG x4(2001), defibrillator(2017). Nonsmoker. EXAM: PORTABLE CHEST 1 VIEW COMPARISON:  01/28/2018 FINDINGS: Patient has LEFT-sided AICD with lead to the RIGHT ventricle. Median sternotomy and CABG. The heart is enlarged. There is pulmonary vascular congestion and mild interstitial pulmonary edema. No consolidations. Previous bilateral shoulder arthroplasty. IMPRESSION: Cardiomegaly and mild interstitial edema. Electronically Signed   By: Nolon Nations M.D.    On: 11/22/2018 09:49    Assessment and Plan:   1. The patient's initial device implant was in 2010, with gen change in 2017     At time of gen change he had AV conduction and narrow QRS     He has subsequently developed significant conduction system disease including LBBB  I think it is reasonable to consider an upgraded device given LBBB, severe LV dysfunction,  also include addition of an atrial lead.    Preferably would try to hold off to do upgrade electively though ifhis HR/conductiondoesnotimprove, may need to do prior to discharge   Will review case with Dr. Lovena Le, he will see  him later today  Continue ongoing care as per primary team     For questions or updates, please contact [facility] Please consult [email or web address] for contact info under     Signed, Baldwin Jamaica, PA-C  11/24/2018 1:14 PM  EP Attendoing Patient seen and examined. Agree with. The patient is admitted with VT and was cardioverted. He has undergone cardiac cath and is pending PCI tomorrow. He has developed progressive bradycardia on his amiodarone and beta blocker. Hopefully his conduction will improve once his IV amio is stopped. He will likely need upgrade to a biv ICD but would try to avoid as an inpatient. Would stop IV lido after PCI and switch from IV to oral amiodarone 200 bid. Finally if VT recurs at the slower rate and on amio, would consider VT ablation.   Mikle Bosworth.D.

## 2018-11-24 NOTE — H&P (View-Only) (Signed)
Progress Note  Patient Name: Stephen Adams Date of Encounter: 11/24/2018  Primary Cardiologist: Dr. Percival Spanish Primary EP: Dr. Lovena Le  Subjective   Reviewed cath results, discussion with heart failure yesterday. Discussed recommendation for no driving for 6 mos. Overall he feels fatigued but no chest pain or other concerns. Discussed all options moving forward, he would like to proceed with PCI tomorrow and CRT discussion with EP as an outpatient.  Inpatient Medications    Scheduled Meds: . aspirin  324 mg Oral Once  . aspirin  81 mg Oral Daily  . atorvastatin  80 mg Oral q1800  . clopidogrel  75 mg Oral Daily  . metoprolol tartrate  5 mg Intravenous Once  . sacubitril-valsartan  1 tablet Oral BID  . sodium chloride flush  3 mL Intravenous Q12H   Continuous Infusions: . sodium chloride 10 mL/hr at 11/24/18 0600  . sodium chloride    . amiodarone 30 mg/hr (11/24/18 0600)  . heparin 1,400 Units/hr (11/24/18 0600)  . lidocaine 1 mg/min (11/24/18 0600)  . magnesium sulfate 1 - 4 g bolus IVPB     PRN Meds: sodium chloride, sodium chloride flush   Vital Signs    Vitals:   11/24/18 0500 11/24/18 0600 11/24/18 0700 11/24/18 0816  BP:  110/84 108/76   Pulse: 63 (!) 57 (!) 56   Resp: (!) 21 20 (!) 22   Temp:    98.6 F (37 C)  TempSrc:    Oral  SpO2: 93% 94% 95%   Weight:      Height:        Intake/Output Summary (Last 24 hours) at 11/24/2018 0843 Last data filed at 11/24/2018 0818 Gross per 24 hour  Intake 961.76 ml  Output 2100 ml  Net -1138.24 ml   Last 3 Weights 11/22/2018 08/25/2018 08/17/2018  Weight (lbs) 202 lb 13.2 oz 203 lb 9.6 oz 205 lb  Weight (kg) 92 kg 92.352 kg 92.987 kg      Telemetry    Improved heart rates, closer to 50s, with occasional pacing - Personally Reviewed  ECG    No new since 2/11 - Personally Reviewed  Physical Exam   GEN: No acute distress.   Neck: No JVD sitting in chair Cardiac: RRR, no murmurs, rubs, or gallops. Cath site  c/d/i Respiratory: Clear to auscultation bilaterally. GI: Soft, nontender, non-distended  MS: No edema; No deformity. Neuro:  Nonfocal  Psych: Normal affect   Labs    Chemistry Recent Labs  Lab 11/22/18 0856 11/22/18 0922 11/23/18 0418 11/23/18 0847 11/23/18 0850 11/24/18 0533  NA 140  --  134* 136 136 133*  K 4.6  --  4.1 3.9 4.0 4.2  CL 103  --  102  --   --  104  CO2 24  --  23  --   --  20*  GLUCOSE 121*  --  95  --   --  115*  BUN 12  --  13  --   --  10  CREATININE 1.13  --  0.87  --   --  0.90  CALCIUM 9.1  --  8.6*  --   --  8.1*  PROT  --  6.7  --   --   --   --   ALBUMIN  --  4.0  --   --   --   --   AST  --  25  --   --   --   --  ALT  --  19  --   --   --   --   ALKPHOS  --  61  --   --   --   --   BILITOT  --  1.4*  --   --   --   --   GFRNONAA >60  --  >60  --   --  >60  GFRAA >60  --  >60  --   --  >60  ANIONGAP 13  --  9  --   --  9     Hematology Recent Labs  Lab 11/22/18 0856 11/23/18 0418 11/23/18 0847 11/23/18 0850 11/24/18 0533  WBC 4.4 5.1  --   --  6.3  RBC 4.80 4.12*  --   --  4.31  HGB 14.8 13.0 13.9 13.9 13.1  HCT 45.9 38.2* 41.0 41.0 40.6  MCV 95.6 92.7  --   --  94.2  MCH 30.8 31.6  --   --  30.4  MCHC 32.2 34.0  --   --  32.3  RDW 13.8 14.1  --   --  13.9  PLT 169 142*  --   --  136*    Cardiac Enzymes Recent Labs  Lab 11/22/18 1534 11/22/18 2153 11/23/18 0418  TROPONINI 2.54* 9.23* 7.36*    Recent Labs  Lab 11/22/18 0852  TROPIPOC 0.00     BNPNo results for input(s): BNP, PROBNP in the last 168 hours.   DDimer No results for input(s): DDIMER in the last 168 hours.   Radiology    Dg Chest Port 1 View  Result Date: 11/22/2018 CLINICAL DATA:  Chest pain since 0700 today. Hx of ischemic cardiomyopathy, hypertensive heart disease, chronic systolic CHF, coronary artery disease, CABG x4(2001), defibrillator(2017). Nonsmoker. EXAM: PORTABLE CHEST 1 VIEW COMPARISON:  01/28/2018 FINDINGS: Patient has LEFT-sided AICD  with lead to the RIGHT ventricle. Median sternotomy and CABG. The heart is enlarged. There is pulmonary vascular congestion and mild interstitial pulmonary edema. No consolidations. Previous bilateral shoulder arthroplasty. IMPRESSION: Cardiomegaly and mild interstitial edema. Electronically Signed   By: Nolon Nations M.D.   On: 11/22/2018 09:49    Cardiac Studies   Cath 11/23/18  Ost LAD to Prox LAD lesion is 90% stenosed.  Prox LAD to Mid LAD lesion is 100% stenosed.  Ost Ramus to Ramus lesion is 100% stenosed.  Prox Cx lesion is 95% stenosed.  Prox RCA lesion is 50% stenosed.  Mid RCA to Dist RCA lesion is 90% stenosed.  LIMA graft was visualized by angiography and is large.  The graft exhibits no disease.  SVG graft was visualized by angiography and is normal in caliber.  Origin to Prox Graft lesion is 50% stenosed.  SVG graft was visualized by angiography and is normal in caliber.  Insertion lesion is 40% stenosed.  SVG graft was visualized by angiography and is large.  The graft exhibits mild diffuse disease.  There is severe left ventricular systolic dysfunction.  The left ventricular ejection fraction is less than 25% by visual estimate.  LV end diastolic pressure is mildly elevated.  There is mild aortic valve stenosis.  Hemodynamic findings consistent with mild pulmonary hypertension.   1. Severe 3 vessel obstructive CAD 2. Patent LIMA to the LAD 3. Patent SVG to a large second diagonal. There is 40% stenosis at the graft anastomosis 4. Patent SVG to a ramus intermediate branch. There is an eccentric 50% stenosis in the proximal SVG 5. Patent SVG to the  RCA, this graft is has diffuse mild disease and is ectatic. 6. The LCx has a severe stenosis in the proximal to mid vessel supplying an OM. There is an acute angulation of the LCx from the left main 7. Mildly elevated LV filling pressures with large V wave. 8. Mild to moderate pulmonary HTN 9. Severe LV  dysfunction. EF is estimated at 15% and LV is severely dilated. 10. Mild Aortic stenosis by cath. Gradient is only 5 mm Hg. This may underestimate his valve severity due to low EF.   Plan: will discuss with primary team. Echo is pending. Consider PCI of the native LCx. I would anticipate this being challenging due to acute angulation of this vessel. If PCI planned will load with P2Y12 inhibitor.   RHC CO/CI 5.9/2.79 PVR 0.12 RA 8 RV 48/4  PA 52/20 (31) Wedge 22 LV EDP 22 Aortic valve mean gradient 5  Echo 11/23/18  1. The left ventricle has severely reduced systolic function, with an ejection fraction of 20-25%. The cavity size was mildly dilated. Left ventricular diastolic Doppler parameters are consistent with pseudonormalization.  2. There is akinesis / dyskinesis of the entire anteroseptal left ventricular segment.  3. The right ventricle has normal systolic function. The cavity was normal. There is no increase in right ventricular wall thickness. Right ventricular systolic pressure is moderately elevated.  4. Left atrial size was mildly dilated.  5. The mitral valve is normal in structure. There is mild thickening and mild calcification. Mitral valve regurgitation is mild to moderate by color flow Doppler.  6. The tricuspid valve is normal in structure.  7. The aortic valve was not assessed There is severe thickening and severe calcifcation of the aortic valve. There is assessment of the aortic valve is difficult . No gradient was recorded of the aortic valve.  8. The interatrial septum was not assessed.  Patient Profile     73 y.o. male with ischemic cardiomyopathy, EF 25-30% 06/2018 s/p single chamber medtronic ICD, CAD (first MI in his early 91s) s/p CABG, HTN, HLD, AAA who was seen in the ER at the request of Dr. Maryan Rued for arrhythmia, found to be in VT.   Assessment & Plan    Ventricular tachycardia, sustained:  -initially treated with amiodarone and lidocaine, and when his  blood pressure began to drop, decision was made and confirmed by patient to proceed with cardioversion of his VT 2/11 in ER. This successfully converted him to a V paced rhythm -unclear trigger, but concern is for ischemia given his history. No recent illnesses, electrolytes normal, no recent med changes. May also be progression of his heart failure. -will continue heparin, amiodarone, and lidocaine. Plan to stop lidocaine and heparin post PCI, continue amiodarone as outpatient -discussed that PCI may not prevent VT. He understands and wishes to proceed with PCI. -discussed no driving for 6 mos -repleting magnesium today, aim for K>4, Mg >2  CAD, s/p CABG: cath shows severe Lcx stenosis, all grafts patent -continue aspirin, atorvastatin -loaded with 600 mg clopidogrel, on 75 mg clopidogrel daily now -on heparin given concern for ischemia as cause, troponins peaked and now downtrending  Ischemic cardiomyopathy with chronic systolic heart failure -continue entresto -appreciate HF input, carvedilol stopped yesterday to minimize bradycardia -likely adding spironolactone today -consider CRT-D upgrade, likely as an outpatient. -HF recommends CPX as an outpatient  CRITICAL CARE Patient is critically ill with multiple organ systems affected and requires high complexity decision making. Total critical care time: 45 minutes. This  time includes gathering of history, evaluation of patient's response to treatment, examination of patient, review of laboratory and imaging studies, and coordination with consultants.   For questions or updates, please contact [place] Please consult [email or web address] for contact info under     Signed, Buford Dresser, MD  11/24/2018, 8:43 AM

## 2018-11-24 NOTE — Progress Notes (Signed)
ANTICOAGULATION CONSULT NOTE  Pharmacy Consult: Heparin Indication: atrial fibrillation, CP/ACS  Patient Measurements: Heparin Dosing Weight: 92 kg  Vital Signs: Temp: 98.8 F (37.1 C) (02/13 0400) Temp Source: Oral (02/13 0400) BP: 108/76 (02/13 0700) Pulse Rate: 56 (02/13 0700)  Labs: Recent Labs    11/22/18 0856 11/22/18 1534 11/22/18 2153  11/23/18 0418 11/23/18 0847 11/23/18 0850 11/23/18 2359 11/24/18 0533  HGB 14.8  --   --   --  13.0 13.9 13.9  --  13.1  HCT 45.9  --   --   --  38.2* 41.0 41.0  --  40.6  PLT 169  --   --   --  142*  --   --   --  136*  HEPARINUNFRC  --  0.33  --    < > 0.55  --   --  0.35 0.24*  CREATININE 1.13  --   --   --  0.87  --   --   --  0.90  TROPONINI  --  2.54* 9.23*  --  7.36*  --   --   --   --    < > = values in this interval not displayed.     Assessment: 30 YOM presented with chest pain now s/p cath with plans for possible PCI. Pharmacy consulted to continue heparin post cath.  No anticoag PTA.  Heparin level is slightly subtherapeutic at 0.24, on 1400 units/hr. Hgb 13.1, plt 136. No infusion issues. No s/sx of bleeding.  Goal of Therapy:  Heparin level 0.3-0.7 units/ml Monitor platelets by anticoagulation protocol: Yes   Plan:  Increase heparin infusion at 1500 units/hr Order heparin level in 6 hours Monitor daily HL, CBC, for s/sx of bleeding  Antonietta Jewel, PharmD, [place] Clinical Pharmacist  Pager: [phone] Phone: [phone]

## 2018-11-24 NOTE — Progress Notes (Signed)
ANTICOAGULATION CONSULT NOTE  Pharmacy Consult: Heparin Indication: atrial fibrillation, CP/ACS  Patient Measurements: Heparin Dosing Weight: 92 kg  Vital Signs: Temp: 97.6 F (36.4 C) (02/12 2000) Temp Source: Oral (02/12 2000) BP: 100/73 (02/12 2000) Pulse Rate: 44 (02/12 2200)  Labs: Recent Labs    11/22/18 0856  11/22/18 1534 11/22/18 2153 11/22/18 2357 11/23/18 0418 11/23/18 0847 11/23/18 0850 11/23/18 2359  HGB 14.8  --   --   --   --  13.0 13.9 13.9  --   HCT 45.9  --   --   --   --  38.2* 41.0 41.0  --   PLT 169  --   --   --   --  142*  --   --   --   HEPARINUNFRC  --    < > 0.33  --  0.56 0.55  --   --  0.35  CREATININE 1.13  --   --   --   --  0.87  --   --   --   TROPONINI  --   --  2.54* 9.23*  --  7.36*  --   --   --    < > = values in this interval not displayed.     Assessment: 89 YOM presented with chest pain now s/p cath with plans for possible PCI. Pharmacy consulted to continue heparin post cath.  Prior heparin rate was 1400 units/hr with therapeutic heparin levels  2/13 AM update: heparin level therapeutic x 1 after re-start  Goal of Therapy:  Heparin level 0.3-0.7 units/ml Monitor platelets by anticoagulation protocol: Yes    Plan:  Cont heparin at 1400 units/hr Heparin level with AM labs  Narda Bonds, PharmD, [place] Pharmacist Phone: [phone]

## 2018-11-24 NOTE — Progress Notes (Signed)
ANTICOAGULATION CONSULT NOTE  Pharmacy Consult: Heparin Indication: atrial fibrillation, CP/ACS  Patient Measurements: Heparin Dosing Weight: 92 kg  Vital Signs: Temp: 98.3 F (36.8 C) (02/13 1152) Temp Source: Oral (02/13 1152) BP: 105/79 (02/13 1400) Pulse Rate: 45 (02/13 1400)  Labs: Recent Labs    11/22/18 0856 11/22/18 1534 11/22/18 2153  11/23/18 0418 11/23/18 0847 11/23/18 0850 11/23/18 2359 11/24/18 0533 11/24/18 1425  HGB 14.8  --   --   --  13.0 13.9 13.9  --  13.1  --   HCT 45.9  --   --   --  38.2* 41.0 41.0  --  40.6  --   PLT 169  --   --   --  142*  --   --   --  136*  --   HEPARINUNFRC  --  0.33  --    < > 0.55  --   --  0.35 0.24* 0.63  CREATININE 1.13  --   --   --  0.87  --   --   --  0.90  --   TROPONINI  --  2.54* 9.23*  --  7.36*  --   --   --   --   --    < > = values in this interval not displayed.     Assessment: 68 YOM presented with chest pain now s/p cath with plans for possible PCI. Pharmacy consulted to continue heparin post cath.  No anticoag PTA.  Heparin level now within goal range on increased infusion rate.  Goal of Therapy:  Heparin level 0.3-0.7 units/ml Monitor platelets by anticoagulation protocol: Yes   Plan:  -Continue heparin infusion at 1500 units/hr -Monitor daily HL, CBC, for s/sx of bleeding   Arrie Senate, PharmD, BCPS Clinical Pharmacist [phone] Please check AMION for all [place] numbers 11/24/2018

## 2018-11-24 NOTE — Progress Notes (Signed)
Progress Note  Patient Name: Stephen Adams Date of Encounter: 11/24/2018  Primary Cardiologist: Dr. Percival Spanish Primary EP: Dr. Lovena Le  Subjective   Reviewed cath results, discussion with heart failure yesterday. Discussed recommendation for no driving for 6 mos. Overall he feels fatigued but no chest pain or other concerns. Discussed all options moving forward, he would like to proceed with PCI tomorrow and CRT discussion with EP as an outpatient.  Inpatient Medications    Scheduled Meds: . aspirin  324 mg Oral Once  . aspirin  81 mg Oral Daily  . atorvastatin  80 mg Oral q1800  . clopidogrel  75 mg Oral Daily  . metoprolol tartrate  5 mg Intravenous Once  . sacubitril-valsartan  1 tablet Oral BID  . sodium chloride flush  3 mL Intravenous Q12H   Continuous Infusions: . sodium chloride 10 mL/hr at 11/24/18 0600  . sodium chloride    . amiodarone 30 mg/hr (11/24/18 0600)  . heparin 1,400 Units/hr (11/24/18 0600)  . lidocaine 1 mg/min (11/24/18 0600)  . magnesium sulfate 1 - 4 g bolus IVPB     PRN Meds: sodium chloride, sodium chloride flush   Vital Signs    Vitals:   11/24/18 0500 11/24/18 0600 11/24/18 0700 11/24/18 0816  BP:  110/84 108/76   Pulse: 63 (!) 57 (!) 56   Resp: (!) 21 20 (!) 22   Temp:    98.6 F (37 C)  TempSrc:    Oral  SpO2: 93% 94% 95%   Weight:      Height:        Intake/Output Summary (Last 24 hours) at 11/24/2018 0843 Last data filed at 11/24/2018 0818 Gross per 24 hour  Intake 961.76 ml  Output 2100 ml  Net -1138.24 ml   Last 3 Weights 11/22/2018 08/25/2018 08/17/2018  Weight (lbs) 202 lb 13.2 oz 203 lb 9.6 oz 205 lb  Weight (kg) 92 kg 92.352 kg 92.987 kg      Telemetry    Improved heart rates, closer to 50s, with occasional pacing - Personally Reviewed  ECG    No new since 2/11 - Personally Reviewed  Physical Exam   GEN: No acute distress.   Neck: No JVD sitting in chair Cardiac: RRR, no murmurs, rubs, or gallops. Cath site  c/d/i Respiratory: Clear to auscultation bilaterally. GI: Soft, nontender, non-distended  MS: No edema; No deformity. Neuro:  Nonfocal  Psych: Normal affect   Labs    Chemistry Recent Labs  Lab 11/22/18 0856 11/22/18 0922 11/23/18 0418 11/23/18 0847 11/23/18 0850 11/24/18 0533  NA 140  --  134* 136 136 133*  K 4.6  --  4.1 3.9 4.0 4.2  CL 103  --  102  --   --  104  CO2 24  --  23  --   --  20*  GLUCOSE 121*  --  95  --   --  115*  BUN 12  --  13  --   --  10  CREATININE 1.13  --  0.87  --   --  0.90  CALCIUM 9.1  --  8.6*  --   --  8.1*  PROT  --  6.7  --   --   --   --   ALBUMIN  --  4.0  --   --   --   --   AST  --  25  --   --   --   --  ALT  --  19  --   --   --   --   ALKPHOS  --  61  --   --   --   --   BILITOT  --  1.4*  --   --   --   --   GFRNONAA >60  --  >60  --   --  >60  GFRAA >60  --  >60  --   --  >60  ANIONGAP 13  --  9  --   --  9     Hematology Recent Labs  Lab 11/22/18 0856 11/23/18 0418 11/23/18 0847 11/23/18 0850 11/24/18 0533  WBC 4.4 5.1  --   --  6.3  RBC 4.80 4.12*  --   --  4.31  HGB 14.8 13.0 13.9 13.9 13.1  HCT 45.9 38.2* 41.0 41.0 40.6  MCV 95.6 92.7  --   --  94.2  MCH 30.8 31.6  --   --  30.4  MCHC 32.2 34.0  --   --  32.3  RDW 13.8 14.1  --   --  13.9  PLT 169 142*  --   --  136*    Cardiac Enzymes Recent Labs  Lab 11/22/18 1534 11/22/18 2153 11/23/18 0418  TROPONINI 2.54* 9.23* 7.36*    Recent Labs  Lab 11/22/18 0852  TROPIPOC 0.00     BNPNo results for input(s): BNP, PROBNP in the last 168 hours.   DDimer No results for input(s): DDIMER in the last 168 hours.   Radiology    Dg Chest Port 1 View  Result Date: 11/22/2018 CLINICAL DATA:  Chest pain since 0700 today. Hx of ischemic cardiomyopathy, hypertensive heart disease, chronic systolic CHF, coronary artery disease, CABG x4(2001), defibrillator(2017). Nonsmoker. EXAM: PORTABLE CHEST 1 VIEW COMPARISON:  01/28/2018 FINDINGS: Patient has LEFT-sided AICD  with lead to the RIGHT ventricle. Median sternotomy and CABG. The heart is enlarged. There is pulmonary vascular congestion and mild interstitial pulmonary edema. No consolidations. Previous bilateral shoulder arthroplasty. IMPRESSION: Cardiomegaly and mild interstitial edema. Electronically Signed   By: Nolon Nations M.D.   On: 11/22/2018 09:49    Cardiac Studies   Cath 11/23/18  Ost LAD to Prox LAD lesion is 90% stenosed.  Prox LAD to Mid LAD lesion is 100% stenosed.  Ost Ramus to Ramus lesion is 100% stenosed.  Prox Cx lesion is 95% stenosed.  Prox RCA lesion is 50% stenosed.  Mid RCA to Dist RCA lesion is 90% stenosed.  LIMA graft was visualized by angiography and is large.  The graft exhibits no disease.  SVG graft was visualized by angiography and is normal in caliber.  Origin to Prox Graft lesion is 50% stenosed.  SVG graft was visualized by angiography and is normal in caliber.  Insertion lesion is 40% stenosed.  SVG graft was visualized by angiography and is large.  The graft exhibits mild diffuse disease.  There is severe left ventricular systolic dysfunction.  The left ventricular ejection fraction is less than 25% by visual estimate.  LV end diastolic pressure is mildly elevated.  There is mild aortic valve stenosis.  Hemodynamic findings consistent with mild pulmonary hypertension.   1. Severe 3 vessel obstructive CAD 2. Patent LIMA to the LAD 3. Patent SVG to a large second diagonal. There is 40% stenosis at the graft anastomosis 4. Patent SVG to a ramus intermediate branch. There is an eccentric 50% stenosis in the proximal SVG 5. Patent SVG to the  RCA, this graft is has diffuse mild disease and is ectatic. 6. The LCx has a severe stenosis in the proximal to mid vessel supplying an OM. There is an acute angulation of the LCx from the left main 7. Mildly elevated LV filling pressures with large V wave. 8. Mild to moderate pulmonary HTN 9. Severe LV  dysfunction. EF is estimated at 15% and LV is severely dilated. 10. Mild Aortic stenosis by cath. Gradient is only 5 mm Hg. This may underestimate his valve severity due to low EF.   Plan: will discuss with primary team. Echo is pending. Consider PCI of the native LCx. I would anticipate this being challenging due to acute angulation of this vessel. If PCI planned will load with P2Y12 inhibitor.   RHC CO/CI 5.9/2.79 PVR 0.12 RA 8 RV 48/4  PA 52/20 (31) Wedge 22 LV EDP 22 Aortic valve mean gradient 5  Echo 11/23/18  1. The left ventricle has severely reduced systolic function, with an ejection fraction of 20-25%. The cavity size was mildly dilated. Left ventricular diastolic Doppler parameters are consistent with pseudonormalization.  2. There is akinesis / dyskinesis of the entire anteroseptal left ventricular segment.  3. The right ventricle has normal systolic function. The cavity was normal. There is no increase in right ventricular wall thickness. Right ventricular systolic pressure is moderately elevated.  4. Left atrial size was mildly dilated.  5. The mitral valve is normal in structure. There is mild thickening and mild calcification. Mitral valve regurgitation is mild to moderate by color flow Doppler.  6. The tricuspid valve is normal in structure.  7. The aortic valve was not assessed There is severe thickening and severe calcifcation of the aortic valve. There is assessment of the aortic valve is difficult . No gradient was recorded of the aortic valve.  8. The interatrial septum was not assessed.  Patient Profile     73 y.o. male with ischemic cardiomyopathy, EF 25-30% 06/2018 s/p single chamber medtronic ICD, CAD (first MI in his early 77s) s/p CABG, HTN, HLD, AAA who was seen in the ER at the request of Dr. Maryan Rued for arrhythmia, found to be in VT.   Assessment & Plan    Ventricular tachycardia, sustained:  -initially treated with amiodarone and lidocaine, and when his  blood pressure began to drop, decision was made and confirmed by patient to proceed with cardioversion of his VT 2/11 in ER. This successfully converted him to a V paced rhythm -unclear trigger, but concern is for ischemia given his history. No recent illnesses, electrolytes normal, no recent med changes. May also be progression of his heart failure. -will continue heparin, amiodarone, and lidocaine. Plan to stop lidocaine and heparin post PCI, continue amiodarone as outpatient -discussed that PCI may not prevent VT. He understands and wishes to proceed with PCI. -discussed no driving for 6 mos -repleting magnesium today, aim for K>4, Mg >2  CAD, s/p CABG: cath shows severe Lcx stenosis, all grafts patent -continue aspirin, atorvastatin -loaded with 600 mg clopidogrel, on 75 mg clopidogrel daily now -on heparin given concern for ischemia as cause, troponins peaked and now downtrending  Ischemic cardiomyopathy with chronic systolic heart failure -continue entresto -appreciate HF input, carvedilol stopped yesterday to minimize bradycardia -likely adding spironolactone today -consider CRT-D upgrade, likely as an outpatient. -HF recommends CPX as an outpatient  CRITICAL CARE Patient is critically ill with multiple organ systems affected and requires high complexity decision making. Total critical care time: 45 minutes. This  time includes gathering of history, evaluation of patient's response to treatment, examination of patient, review of laboratory and imaging studies, and coordination with consultants.   For questions or updates, please contact [place] Please consult [email or web address] for contact info under     Signed, Buford Dresser, MD  11/24/2018, 8:43 AM

## 2018-11-25 ENCOUNTER — Encounter (HOSPITAL_COMMUNITY): Payer: Self-pay | Admitting: Cardiology

## 2018-11-25 ENCOUNTER — Inpatient Hospital Stay (HOSPITAL_COMMUNITY)
Admission: EM | Disposition: A | Discharge status: Home / Self Care | Payer: Self-pay | Source: Home / Self Care | Attending: Cardiology

## 2018-11-25 HISTORY — PX: CORONARY STENT INTERVENTION: CATH118234

## 2018-11-25 LAB — CBC
HCT: 38.9 % — ABNORMAL LOW (ref 39.0–52.0)
HEMOGLOBIN: 13.6 g/dL (ref 13.0–17.0)
MCH: 31.8 pg (ref 26.0–34.0)
MCHC: 35 g/dL (ref 30.0–36.0)
MCV: 90.9 fL (ref 80.0–100.0)
Platelets: 130 10*3/uL — ABNORMAL LOW (ref 150–400)
RBC: 4.28 MIL/uL (ref 4.22–5.81)
RDW: 14 % (ref 11.5–15.5)
WBC: 6.1 10*3/uL (ref 4.0–10.5)
nRBC: 0 % (ref 0.0–0.2)

## 2018-11-25 LAB — BASIC METABOLIC PANEL
Anion gap: 9 (ref 5–15)
BUN: 7 mg/dL — ABNORMAL LOW (ref 8–23)
CHLORIDE: 101 mmol/L (ref 98–111)
CO2: 22 mmol/L (ref 22–32)
Calcium: 8.3 mg/dL — ABNORMAL LOW (ref 8.9–10.3)
Creatinine, Ser: 0.82 mg/dL (ref 0.61–1.24)
GFR calc non Af Amer: >60 mL/min (ref >60)
Glucose, Bld: 119 mg/dL — ABNORMAL HIGH (ref 70–99)
Potassium: 4 mmol/L (ref 3.5–5.1)
Sodium: 132 mmol/L — ABNORMAL LOW (ref 135–145)

## 2018-11-25 LAB — HEPARIN LEVEL (UNFRACTIONATED): Heparin Unfractionated: 0.58 IU/mL (ref 0.30–0.70)

## 2018-11-25 LAB — POCT ACTIVATED CLOTTING TIME: Activated Clotting Time: 516 seconds

## 2018-11-25 LAB — MAGNESIUM: MAGNESIUM: 1.9 mg/dL (ref 1.7–2.4)

## 2018-11-25 SURGERY — CORONARY STENT INTERVENTION
Anesthesia: LOCAL

## 2018-11-25 MED ORDER — VERAPAMIL HCL 2.5 MG/ML IV SOLN
INTRAVENOUS | Status: DC | PRN
  Administered 2018-11-25: 10 mL via INTRA_ARTERIAL

## 2018-11-25 MED ORDER — SODIUM CHLORIDE 0.9 % IV SOLN: 250.0000 mL | INTRAVENOUS | Status: DC | PRN

## 2018-11-25 MED ORDER — LABETALOL HCL 5 MG/ML IV SOLN: 10.0000 mg | INTRAVENOUS | Status: AC | PRN

## 2018-11-25 MED ORDER — HEPARIN (PORCINE) IN NACL 1000-0.9 UT/500ML-% IV SOLN
INTRAVENOUS | Status: AC
  Filled 2018-11-25: qty 500

## 2018-11-25 MED ORDER — SODIUM CHLORIDE 0.9% FLUSH: 3.0000 mL | INTRAVENOUS | Status: DC | PRN

## 2018-11-25 MED ORDER — LIDOCAINE HCL (PF) 1 % IJ SOLN
INTRAMUSCULAR | Status: AC
  Filled 2018-11-25: qty 30

## 2018-11-25 MED ORDER — NITROGLYCERIN 1 MG/10 ML FOR IR/CATH LAB
INTRA_ARTERIAL | Status: AC
  Filled 2018-11-25: qty 10

## 2018-11-25 MED ORDER — ASPIRIN 81 MG PO CHEW
81.0000 mg | CHEWABLE_TABLET | ORAL | Status: AC
  Administered 2018-11-25: 81 mg via ORAL
  Filled 2018-11-25: qty 1

## 2018-11-25 MED ORDER — LIDOCAINE HCL (PF) 1 % IJ SOLN
INTRAMUSCULAR | Status: DC | PRN
  Administered 2018-11-25: 3 mL via INTRADERMAL

## 2018-11-25 MED ORDER — FENTANYL CITRATE (PF) 100 MCG/2ML IJ SOLN
INTRAMUSCULAR | Status: DC | PRN
  Administered 2018-11-25 (×2): 25 ug via INTRAVENOUS

## 2018-11-25 MED ORDER — SODIUM CHLORIDE 0.9% FLUSH: 3.0000 mL | Freq: Two times a day (BID) | INTRAVENOUS | Status: DC

## 2018-11-25 MED ORDER — FENTANYL CITRATE (PF) 100 MCG/2ML IJ SOLN
INTRAMUSCULAR | Status: AC
  Filled 2018-11-25: qty 2

## 2018-11-25 MED ORDER — HEPARIN (PORCINE) IN NACL 1000-0.9 UT/500ML-% IV SOLN
INTRAVENOUS | Status: DC | PRN
  Administered 2018-11-25 (×2): 500 mL

## 2018-11-25 MED ORDER — SODIUM CHLORIDE 0.9 % WEIGHT BASED INFUSION: 3.0000 mL/kg/h | INTRAVENOUS | Status: DC

## 2018-11-25 MED ORDER — MIDAZOLAM HCL 2 MG/2ML IJ SOLN
INTRAMUSCULAR | Status: AC
  Filled 2018-11-25: qty 2

## 2018-11-25 MED ORDER — SODIUM CHLORIDE 0.9 % IV SOLN: INTRAVENOUS | Status: AC

## 2018-11-25 MED ORDER — SODIUM CHLORIDE 0.9 % WEIGHT BASED INFUSION
1.0000 mL/kg/h | INTRAVENOUS | Status: DC
  Administered 2018-11-25: 1 mL/kg/h via INTRAVENOUS

## 2018-11-25 MED ORDER — IOHEXOL 350 MG/ML SOLN
INTRAVENOUS | Status: DC | PRN
  Administered 2018-11-25: 90 mL via INTRAVENOUS

## 2018-11-25 MED ORDER — MIDAZOLAM HCL 2 MG/2ML IJ SOLN
INTRAMUSCULAR | Status: DC | PRN
  Administered 2018-11-25 (×2): 1 mg via INTRAVENOUS

## 2018-11-25 MED ORDER — ANGIOPLASTY BOOK
Freq: Once | Status: AC
  Administered 2018-11-25: 21:00:00
  Filled 2018-11-25: qty 1

## 2018-11-25 MED ORDER — ENOXAPARIN SODIUM 40 MG/0.4ML ~~LOC~~ SOLN
40.0000 mg | SUBCUTANEOUS | Status: DC
  Administered 2018-11-26: 09:00:00 40 mg via SUBCUTANEOUS
  Filled 2018-11-25: qty 0.4

## 2018-11-25 MED ORDER — HEPARIN SODIUM (PORCINE) 1000 UNIT/ML IJ SOLN
INTRAMUSCULAR | Status: AC
  Filled 2018-11-25: qty 1

## 2018-11-25 MED ORDER — SODIUM CHLORIDE 0.9% FLUSH
3.0000 mL | Freq: Two times a day (BID) | INTRAVENOUS | Status: DC
  Administered 2018-11-25 (×2): 3 mL via INTRAVENOUS

## 2018-11-25 MED ORDER — HYDRALAZINE HCL 20 MG/ML IJ SOLN: 5.0000 mg | INTRAMUSCULAR | Status: AC | PRN

## 2018-11-25 MED ORDER — HEPARIN SODIUM (PORCINE) 1000 UNIT/ML IJ SOLN
INTRAMUSCULAR | Status: DC | PRN
  Administered 2018-11-25: 9000 [IU] via INTRAVENOUS

## 2018-11-25 MED ORDER — VERAPAMIL HCL 2.5 MG/ML IV SOLN
INTRAVENOUS | Status: AC
  Filled 2018-11-25: qty 2

## 2018-11-25 SURGICAL SUPPLY — 18 items
BALLN SAPPHIRE 2.5X12 (BALLOONS) ×2
BALLN SAPPHIRE ~~LOC~~ 3.0X12 (BALLOONS) ×2 IMPLANT
BALLOON SAPPHIRE 2.5X12 (BALLOONS) ×1 IMPLANT
CATH LAUNCHER 6FR EBU 3 (CATHETERS) ×2 IMPLANT
CATH LAUNCHER 6FR EBU3.5 (CATHETERS) ×2 IMPLANT
CATH VISTA GUIDE 6FR XBLAD4 (CATHETERS) ×2 IMPLANT
DEVICE RAD COMP TR BAND LRG (VASCULAR PRODUCTS) ×2 IMPLANT
GLIDESHEATH SLEND SS 6F .021 (SHEATH) ×2 IMPLANT
GUIDELINER 6F (CATHETERS) ×2 IMPLANT
GUIDEWIRE INQWIRE 1.5J.035X260 (WIRE) ×1 IMPLANT
INQWIRE 1.5J .035X260CM (WIRE) ×2
KIT ENCORE 26 ADVANTAGE (KITS) ×2 IMPLANT
KIT HEART LEFT (KITS) ×2 IMPLANT
PACK CARDIAC CATHETERIZATION (CUSTOM PROCEDURE TRAY) ×2 IMPLANT
STENT RESOLUTE ONYX 3.0X18 (Permanent Stent) ×2 IMPLANT
TRANSDUCER W/STOPCOCK (MISCELLANEOUS) ×2 IMPLANT
TUBING CIL FLEX 10 FLL-RA (TUBING) ×2 IMPLANT
WIRE ASAHI PROWATER 180CM (WIRE) ×2 IMPLANT

## 2018-11-25 NOTE — Interval H&P Note (Signed)
History and Physical Interval Note:  11/25/2018 9:45 AM  Stephen Adams  has presented today for surgery, with the diagnosis of cad  The various methods of treatment have been discussed with the patient and family. After consideration of risks, benefits and other options for treatment, the patient has consented to  Procedure(s): CORONARY STENT INTERVENTION (N/A) as a surgical intervention .  The patient's history has been reviewed, patient examined, no change in status, stable for surgery.  I have reviewed the patient's chart and labs.  Questions were answered to the patient's satisfaction.   Cath Lab Visit (complete for each Cath Lab visit)  Clinical Evaluation Leading to the Procedure:   ACS: Yes.    Non-ACS:    Anginal Classification: CCS IV  Anti-ischemic medical therapy: Maximal Therapy (2 or more classes of medications)  Non-Invasive Test Results: No non-invasive testing performed  Prior CABG: Previous CABG        Stephen Adams 11/25/2018 9:45 AM

## 2018-11-25 NOTE — Progress Notes (Signed)
Progress Note  Patient Name: Stephen Adams Date of Encounter: 11/25/2018  Primary Cardiologist: Dr. Percival Spanish Primary EP: Dr. Lovena Le  Subjective   Seen prior to cath today. In good spirits. Discussed options moving forward after cath.  Inpatient Medications    Scheduled Meds: . aspirin  324 mg Oral Once  . aspirin  81 mg Oral Daily  . atorvastatin  80 mg Oral q1800  . clopidogrel  75 mg Oral Daily  . metoprolol tartrate  5 mg Intravenous Once  . sacubitril-valsartan  1 tablet Oral BID  . sodium chloride flush  3 mL Intravenous Q12H  . sodium chloride flush  3 mL Intravenous Q12H   Continuous Infusions: . sodium chloride 92 mL/hr at 11/25/18 0600  . sodium chloride    . sodium chloride    . sodium chloride 1 mL/kg/hr (11/25/18 0549)  . amiodarone 30 mg/hr (11/25/18 0600)  . heparin 1,500 Units/hr (11/25/18 0600)  . lidocaine 1 mg/min (11/25/18 0600)   PRN Meds: sodium chloride, sodium chloride, sodium chloride flush, sodium chloride flush   Vital Signs    Vitals:   11/25/18 0400 11/25/18 0500 11/25/18 0506 11/25/18 0600  BP: 94/62 (!) 95/54    Pulse: (!) 28 64  (!) 43  Resp: (!) 23 (!) 23  (!) 23  Temp:      TempSrc:      SpO2: 94% 94%  92%  Weight:   91.7 kg   Height:        Intake/Output Summary (Last 24 hours) at 11/25/2018 0755 Last data filed at 11/25/2018 0600 Gross per 24 hour  Intake 1446.83 ml  Output 2300 ml  Net -853.17 ml   Last 3 Weights 11/25/2018 11/22/2018 08/25/2018  Weight (lbs) 202 lb 2.6 oz 202 lb 13.2 oz 203 lb 9.6 oz  Weight (kg) 91.7 kg 92 kg 92.352 kg      Telemetry    Intrinsic sinus at 50-60 bpm with occasional pacing - Personally Reviewed  ECG    SR, with heart block and ventricular rhythm/ventricular pacing, LBBB - Personally Reviewed  Physical Exam   GEN: No acute distress.   Neck: No JVD sitting in chair Cardiac: RRR, no murmurs, rubs, or gallops. Cath site c/d/i Respiratory: Clear to auscultation bilaterally. GI:  Soft, nontender, non-distended  MS: No edema; No deformity. Neuro:  Nonfocal  Psych: Normal affect   Labs    Chemistry Recent Labs  Lab 11/22/18 0922 11/23/18 0418  11/23/18 0850 11/24/18 0533 11/25/18 0254  NA  --  134*   < > 136 133* 132*  K  --  4.1   < > 4.0 4.2 4.0  CL  --  102  --   --  104 101  CO2  --  23  --   --  20* 22  GLUCOSE  --  95  --   --  115* 119*  BUN  --  13  --   --  10 7*  CREATININE  --  0.87  --   --  0.90 0.82  CALCIUM  --  8.6*  --   --  8.1* 8.3*  PROT 6.7  --   --   --   --   --   ALBUMIN 4.0  --   --   --   --   --   AST 25  --   --   --   --   --   ALT 19  --   --   --   --   --  ALKPHOS 61  --   --   --   --   --   BILITOT 1.4*  --   --   --   --   --   GFRNONAA  --  >60  --   --  >60 >60  GFRAA  --  >60  --   --  >60 >60  ANIONGAP  --  9  --   --  9 9   < > = values in this interval not displayed.     Hematology Recent Labs  Lab 11/23/18 0418  11/23/18 0850 11/24/18 0533 11/25/18 0254  WBC 5.1  --   --  6.3 6.1  RBC 4.12*  --   --  4.31 4.28  HGB 13.0   < > 13.9 13.1 13.6  HCT 38.2*   < > 41.0 40.6 38.9*  MCV 92.7  --   --  94.2 90.9  MCH 31.6  --   --  30.4 31.8  MCHC 34.0  --   --  32.3 35.0  RDW 14.1  --   --  13.9 14.0  PLT 142*  --   --  136* 130*   < > = values in this interval not displayed.    Cardiac Enzymes Recent Labs  Lab 11/22/18 1534 11/22/18 2153 11/23/18 0418  TROPONINI 2.54* 9.23* 7.36*    Recent Labs  Lab 11/22/18 0852  TROPIPOC 0.00     BNPNo results for input(s): BNP, PROBNP in the last 168 hours.   DDimer No results for input(s): DDIMER in the last 168 hours.   Radiology    No results found.  Cardiac Studies   Cath 11/23/18  Ost LAD to Prox LAD lesion is 90% stenosed.  Prox LAD to Mid LAD lesion is 100% stenosed.  Ost Ramus to Ramus lesion is 100% stenosed.  Prox Cx lesion is 95% stenosed.  Prox RCA lesion is 50% stenosed.  Mid RCA to Dist RCA lesion is 90% stenosed.  LIMA  graft was visualized by angiography and is large.  The graft exhibits no disease.  SVG graft was visualized by angiography and is normal in caliber.  Origin to Prox Graft lesion is 50% stenosed.  SVG graft was visualized by angiography and is normal in caliber.  Insertion lesion is 40% stenosed.  SVG graft was visualized by angiography and is large.  The graft exhibits mild diffuse disease.  There is severe left ventricular systolic dysfunction.  The left ventricular ejection fraction is less than 25% by visual estimate.  LV end diastolic pressure is mildly elevated.  There is mild aortic valve stenosis.  Hemodynamic findings consistent with mild pulmonary hypertension.   1. Severe 3 vessel obstructive CAD 2. Patent LIMA to the LAD 3. Patent SVG to a large second diagonal. There is 40% stenosis at the graft anastomosis 4. Patent SVG to a ramus intermediate branch. There is an eccentric 50% stenosis in the proximal SVG 5. Patent SVG to the RCA, this graft is has diffuse mild disease and is ectatic. 6. The LCx has a severe stenosis in the proximal to mid vessel supplying an OM. There is an acute angulation of the LCx from the left main 7. Mildly elevated LV filling pressures with large V wave. 8. Mild to moderate pulmonary HTN 9. Severe LV dysfunction. EF is estimated at 15% and LV is severely dilated. 10. Mild Aortic stenosis by cath. Gradient is only 5 mm Hg. This may underestimate his valve severity  due to low EF.   Plan: will discuss with primary team. Echo is pending. Consider PCI of the native LCx. I would anticipate this being challenging due to acute angulation of this vessel. If PCI planned will load with P2Y12 inhibitor.   RHC CO/CI 5.9/2.79 PVR 0.12 RA 8 RV 48/4  PA 52/20 (31) Wedge 22 LV EDP 22 Aortic valve mean gradient 5  Echo 11/23/18  1. The left ventricle has severely reduced systolic function, with an ejection fraction of 20-25%. The cavity size was  mildly dilated. Left ventricular diastolic Doppler parameters are consistent with pseudonormalization.  2. There is akinesis / dyskinesis of the entire anteroseptal left ventricular segment.  3. The right ventricle has normal systolic function. The cavity was normal. There is no increase in right ventricular wall thickness. Right ventricular systolic pressure is moderately elevated.  4. Left atrial size was mildly dilated.  5. The mitral valve is normal in structure. There is mild thickening and mild calcification. Mitral valve regurgitation is mild to moderate by color flow Doppler.  6. The tricuspid valve is normal in structure.  7. The aortic valve was not assessed There is severe thickening and severe calcifcation of the aortic valve. There is assessment of the aortic valve is difficult . No gradient was recorded of the aortic valve.  8. The interatrial septum was not assessed.  Patient Profile     73 y.o. male with ischemic cardiomyopathy, EF 25-30% 06/2018 s/p single chamber medtronic ICD, CAD (first MI in his early 27s) s/p CABG, HTN, HLD, AAA who was seen in the ER at the request of Dr. Maryan Rued for arrhythmia, found to be in VT.   Assessment & Plan    Ventricular tachycardia, sustained:  -presented 2/11, treated with amiodarone, lidocaine, and then cardioversion when blood pressure began to dip -cath with severe LCx stenosis, patent grafts -plan for PCI of LCx today. Cr, CBC stable -continue amiodarone, change to 200 mg oral BID tomorrow.  -stop lidocaine post PCI -discussed no driving for 6 mos -K>4, Mg >2  CAD, s/p CABG: cath shows severe Lcx stenosis, all grafts patent -continue aspirin, atorvastatin -loaded with 600 mg clopidogrel, on 75 mg clopidogrel daily now -on heparin given concern for ischemia as cause, troponins peaked and now downtrending.Likely stop post PCI  Ischemic cardiomyopathy with chronic systolic heart failure -continue entresto -appreciate HF input,  carvedilol stopped to minimize bradycardia -appreciate EP input, now with progressive conduction system disease and bradycardia, will likely need CRT upgrade, ideally as an outpatient but possibly prior to discharge -BP low overnight, holding on spironolactone for now -HF recommends CPX as an outpatient  CRITICAL CARE Patient is critically ill with multiple organ systems affected and requires high complexity decision making. Total critical care time: 30 minutes. This time includes gathering of history, evaluation of patient's response to treatment, examination of patient, review of laboratory and imaging studies, and coordination with consultants.   For questions or updates, please contact [facility] Please consult [email or web address] for contact info under     Signed, Buford Dresser, MD  11/25/2018, 7:55 AM

## 2018-11-25 NOTE — Progress Notes (Signed)
Patient NIV home unit machine was set up per patient. RT at bedside to help.

## 2018-11-25 NOTE — Progress Notes (Signed)
TR BAND REMOVAL  LOCATION:    right radial  DEFLATED PER PROTOCOL:    Yes.    TIME BAND OFF / DRESSING APPLIED:    1600   SITE UPON ARRIVAL:    Level 0  SITE AFTER BAND REMOVAL:    Level 0  CIRCULATION SENSATION AND MOVEMENT:    Within Normal Limits   Yes.    COMMENTS:   Tolerated procedure well

## 2018-11-26 ENCOUNTER — Inpatient Hospital Stay (HOSPITAL_COMMUNITY): Payer: Medicare Other

## 2018-11-26 DIAGNOSIS — M7989 Other specified soft tissue disorders: Secondary | ICD-10-CM

## 2018-11-26 DIAGNOSIS — R0989 Other specified symptoms and signs involving the circulatory and respiratory systems: Secondary | ICD-10-CM

## 2018-11-26 LAB — BASIC METABOLIC PANEL
Anion gap: 6 (ref 5–15)
BUN: 10 mg/dL (ref 8–23)
CO2: 24 mmol/L (ref 22–32)
Calcium: 8.1 mg/dL — ABNORMAL LOW (ref 8.9–10.3)
Chloride: 104 mmol/L (ref 98–111)
Creatinine, Ser: 0.89 mg/dL (ref 0.61–1.24)
GFR calc Af Amer: >60 mL/min (ref >60)
GFR calc non Af Amer: >60 mL/min (ref >60)
Glucose, Bld: 101 mg/dL — ABNORMAL HIGH (ref 70–99)
Potassium: 4.4 mmol/L (ref 3.5–5.1)
Sodium: 134 mmol/L — ABNORMAL LOW (ref 135–145)

## 2018-11-26 LAB — CBC
HCT: 35.1 % — ABNORMAL LOW (ref 39.0–52.0)
Hemoglobin: 11.7 g/dL — ABNORMAL LOW (ref 13.0–17.0)
MCH: 30.7 pg (ref 26.0–34.0)
MCHC: 33.3 g/dL (ref 30.0–36.0)
MCV: 92.1 fL (ref 80.0–100.0)
NRBC: 0 % (ref 0.0–0.2)
Platelets: 114 10*3/uL — ABNORMAL LOW (ref 150–400)
RBC: 3.81 MIL/uL — ABNORMAL LOW (ref 4.22–5.81)
RDW: 14 % (ref 11.5–15.5)
WBC: 4.4 10*3/uL (ref 4.0–10.5)

## 2018-11-26 LAB — MAGNESIUM: Magnesium: 1.8 mg/dL (ref 1.7–2.4)

## 2018-11-26 MED ORDER — ATORVASTATIN CALCIUM 80 MG PO TABS: 80.0000 mg | ORAL_TABLET | Freq: Every day | ORAL | 0 refills | Status: DC

## 2018-11-26 MED ORDER — AMIODARONE HCL 400 MG PO TABS: 400.0000 mg | ORAL_TABLET | Freq: Two times a day (BID) | ORAL | 1 refills | Status: DC

## 2018-11-26 MED ORDER — CLOPIDOGREL BISULFATE 75 MG PO TABS: 75.0000 mg | ORAL_TABLET | Freq: Every day | ORAL | 11 refills | Status: AC

## 2018-11-26 MED ORDER — POTASSIUM CHLORIDE CRYS ER 10 MEQ PO TBCR: 10.0000 meq | EXTENDED_RELEASE_TABLET | Freq: Every day | ORAL | 0 refills | Status: DC

## 2018-11-26 MED ORDER — AMIODARONE HCL 200 MG PO TABS
400.0000 mg | ORAL_TABLET | Freq: Two times a day (BID) | ORAL | Status: DC
  Administered 2018-11-26: 400 mg via ORAL
  Filled 2018-11-26: qty 2

## 2018-11-26 NOTE — Progress Notes (Signed)
VASCULAR LAB PRELIMINARY  PRELIMINARY  PRELIMINARY  PRELIMINARY  Left upper extremity arterial duplex completed.    Preliminary report:  See CV Proc  Called results to Lassalle Comunidad, RN and Charise Carwin  [facility], [facility], RVT 11/26/2018, 11:24 AM

## 2018-11-26 NOTE — Progress Notes (Signed)
CARDIAC REHAB PHASE I   PRE:  Rate/Rhythm: 48  BP:  Sitting: 109/70     SaO2: 97 ra  MODE:  Ambulation: 400 ft   POST:  Rate/Rhythm: 69  BP:  Sitting: 131/80     SaO2: 98 ra  9:00am-9:40am Patient ambulated independently with no problems. Patient stated it felt good to walk. Patient is interested in attending Cardiac Rehab at [facility]. Educated on nutrition, exercise, risk factors. Patient in bed with wife at bedside.  [place], MS 11/26/2018 9:33 AM

## 2018-11-26 NOTE — Discharge Summary (Signed)
Discharge Summary    Patient ID: Stephen Adams  MRN: [id number], DOB/AGE: Dec 10, 1945 73 y.o.  Admit Date: 11/22/2018 Discharge Date: 11/26/2018  Primary Care Provider: Josetta Huddle, MD Primary Cardiologist: Dr. Percival Spanish, MD Primary Electrophysiologist: Dr. Lovena Le, MD  Discharge Diagnoses    Principal Problem:   NSTEMI (non-ST elevated myocardial infarction) [facility]) Active Problems:   CAD (coronary artery disease)   Hx of CABG   Presence of automatic (implantable) cardiac defibrillator   Chronic systolic CHF (congestive heart failure) ([place])   Ischemic cardiomyopathy   Ventricular tachycardia (HCC)   Allergies Allergies  Allergen Reactions  . Oxycodone-Acetaminophen Nausea Only     History of Present Illness     73 year old male with history of CAD with multiple remote interventions s/p inferior MI in 2001 with VF arrest s/p CABG in 2002 all performed in [place], HFrEF secondary to ICM s/p ICD in 10/2008 with generator change in 2017, intermittent LBBB, PVCs, AAA, hypertensive heart disease, HLD, and chronic renal insufficiency who was admitted to the hospital on 2/11 with VT.  Prior echo from 06/2018 demonstrated an EF that was unchanged at 25%.  He had a Lexiscan Myoview in 06/2018 that showed a large defect of severe severity present in the basal anterior, basal anteroseptal, basal inferoseptal, basal inferior, mid anterior, mid anteroseptal, mid inferoseptal, mid inferior, apical anterior, apical septal, apical inferior and apex location. This was consistent with infarct not ischemia. He also appeared to have moderate aortic stenosis. Patient was recently seen by EP in 08/2018 with ICD interrogation with function intact, RV pacing was found to be increased with worsening conduction system disease. Coreg was reduced to 3.125 mg BID, in effort to reduce need for RV pacing. OpitVol was normal without evidence of fluid overload. He was last seen in the office in 08/2018 and was without new  complaints.    He was in his usual state of health until the morning of 11/22/2018 around 7 AM when he woke up and felt unwell. He noted a tightness in his chest, described as a pounding that was not similar to his prior MI. He denied any ICD firings.   Hospital Course     Consultants: EP/ advanced heart failure/cardiac rehab/pharmacy    In the ED he was found to be in a WCT around 140 bpm (VT zone set at 180 bpm). [employer] remote interrogation reviewed. There was note of Afib, but as he only has a single chamber device, this is based on algorithms and not atrial sensing. His VT zone is set >180, and his backup rate is at 40 bpm. In the ER, he was given diltiazem initially, which dropped his blood pressure. He was started amiodarone and heparin. After amiodarone bolus and drip, he was still hemodynamically stable, though his BP was drifting slightly lower. Lidocaine bolus and drip also started, but he began having intermittent hypotension. Decision was made with Dr. Maryan Rued to cardiovert. He received propofol and was cardioverted x1 with 120 J. He was V paced at 40 bpm after shock, with occasional native complexes conducting. He reported that his chest discomfort was gone post shock. Initial lytes showed a magnesium of 1.7 and potassium of 4.6. BP 3214. Troponin trend 2.54>9.23>7.36. He underwent R/LHC on 11/23/2018 that showed severe native 3-vessel CAD with patent LIMA to LAD, patent SVG to large D2 with 40% stenosis at the graft anastomosis, patent SVG to ramus with an eccentric 50% stenosis in the proximal SVG, patent SVG to RCA with mild  diffuse disease was was ectatic. The LCx had severe stenosis in the proximal to mid vessel supplying an OM. Mild to moderate pulmonary HTN. Severe LV dysfunction with an EF of 15% and severely dilated LV. Mild aortic stenosis with a gradient of 5 mmHg, possibly underestimated due to low EF. Case was initially discussed with the primary regarding PCI of the native LCx  (which was felt to be challenging due to acute angulation of the vessel). Echo on 11/23/2018 showed an EF of 20-25%, mildly dilated LV, AK/dyskineis of the entire anteroseptal LV segment, normal RVSF with normal RV cavity size, and moderately elevated RVSP. The left atrium was mildly dilated. Mild to moderate mitral regurgitation. Assessment of the aortic valve was difficult. No gradient was recorded. RHC showed RA 8, PCWP 22, PA 52/20 (31), PAPi 4, PVR 1.5, Fick CO/CI 5.9/2.7. He was seen by the advanced heart failure service with stable volume status. His Coreg was stopped due to bradycardia with recommendation for CPX in a few weeks. He was consulted on by EP with noted AV conduction and narrow QRS at the time of his generator change in 2017. Since then, he had developed significant conduction system disease including LBBB. If was recommended to consider upgraded device (BiV) given his LBBB and severe LV dysfunction, including addition of an atrial lead with the preference being to defer this to follow up. He was continued on IV amiodarone loading throughout his admission and transitioned to PO amiodarone on 11/26/2018. For recurrence of VT at a slower rate and on amiodarone, VT ablation would need to be considered. He ultimately underwent PCI/DES of the proximal LCx on 11/25/2018 with recommendation to continue DAPT for 12 months. He was initially placed on spironolactone, though this had to be held secondary to hypotension. He was continued on Entresto along with his DAPT, Lipitor, Lasix, and amiodarone.   Post cath labs showed a low though stable HGB, K+ 4.4, SCr 0.89, magnesium 1.8. He was transitioned to PO amiodarone 400 mg bid x 7 days, then 200 mg daily thereafter on 11/26/2018.   In rounds on 11/26/2018 he was noted to have a mildly swollen left upper extremity with 2+ radial pulse. He ambulated with cardiac rehab without issues. Following this, he notified the nurse he had left upper extremity swelling  that he stated was new. Radial pulse was palpated by myself and initially felt to be slightly weaker than the right upper extremity. Stat arterial duplex ultrasound was ordered. I was then called back into his room for a dime size soft tissue swelling of the left lower extremity that he too stated was new. Of note, there were no inpatient procedures or interventions in this region. At the time I checked his lower extremity swelling I also rechecked his upper extremity swelling, which was still mild and stable. His radial pulse was noted to have been improved and 2+ at that time. Left upper extremity arterial duplex ultrasound showed no arterial abnormalities. There was a small superficial clot in the cephalic vein in the setting of his IV. He was advised to perform warm compresses.    He has been advised to not drive for 6 months.   The patient's cath site has been examined is healing well without issues at this time. The patient has been seen by Dr. Harrell Gave and felt to be stable for discharge today. All follow up appointments have been made. Discharge medications are listed below. Prescriptions have been reviewed with the patient and sent in to  their pharmacy.  _____________  Discharge Vitals Blood pressure 126/72, pulse (!) 45, temperature 98.2 F (36.8 C), temperature source Oral, resp. rate 19, height 5\' 11"  (1.803 m), weight 92.5 kg, SpO2 96 %.  Filed Weights   11/22/18 1100 11/25/18 0506 11/26/18 0706  Weight: 92 kg 91.7 kg 92.5 kg    Labs & Radiologic Studies    CBC Recent Labs    11/25/18 0254 11/26/18 0228  WBC 6.1 4.4  HGB 13.6 11.7*  HCT 38.9* 35.1*  MCV 90.9 92.1  PLT 130* 696*   Basic Metabolic Panel Recent Labs    11/25/18 0254 11/26/18 0228  NA 132* 134*  K 4.0 4.4  CL 101 104  CO2 22 24  GLUCOSE 119* 101*  BUN 7* 10  CREATININE 0.82 0.89  CALCIUM 8.3* 8.1*  MG 1.9 1.8   Liver Function Tests No results for input(s): AST, ALT, ALKPHOS, BILITOT, PROT,  ALBUMIN in the last 72 hours. No results for input(s): LIPASE, AMYLASE in the last 72 hours. Cardiac Enzymes No results for input(s): CKTOTAL, CKMB, CKMBINDEX, TROPONINI in the last 72 hours. BNP Invalid input(s): POCBNP D-Dimer No results for input(s): DDIMER in the last 72 hours. Hemoglobin A1C No results for input(s): HGBA1C in the last 72 hours. Fasting Lipid Panel No results for input(s): CHOL, HDL, LDLCALC, TRIG, CHOLHDL, LDLDIRECT in the last 72 hours. Thyroid Function Tests No results for input(s): TSH, T4TOTAL, T3FREE, THYROIDAB in the last 72 hours.  Invalid input(s): FREET3 _____________  Dg Chest Port 1 View  Result Date: 11/22/2018 CLINICAL DATA:  Chest pain since 0700 today. Hx of ischemic cardiomyopathy, hypertensive heart disease, chronic systolic CHF, coronary artery disease, CABG x4(2001), defibrillator(2017). Nonsmoker. EXAM: PORTABLE CHEST 1 VIEW COMPARISON:  01/28/2018 FINDINGS: Patient has LEFT-sided AICD with lead to the RIGHT ventricle. Median sternotomy and CABG. The heart is enlarged. There is pulmonary vascular congestion and mild interstitial pulmonary edema. No consolidations. Previous bilateral shoulder arthroplasty. IMPRESSION: Cardiomegaly and mild interstitial edema. Electronically Signed   By: Nolon Nations M.D.   On: 11/22/2018 09:49    Diagnostic Studies/Procedures   Echo 11/23/2018: IMPRESSIONS    1. The left ventricle has severely reduced systolic function, with an ejection fraction of 20-25%. The cavity size was mildly dilated. Left ventricular diastolic Doppler parameters are consistent with pseudonormalization.  2. There is akinesis / dyskinesis of the entire anteroseptal left ventricular segment.  3. The right ventricle has normal systolic function. The cavity was normal. There is no increase in right ventricular wall thickness. Right ventricular systolic pressure is moderately elevated.  4. Left atrial size was mildly dilated.  5. The  mitral valve is normal in structure. There is mild thickening and mild calcification. Mitral valve regurgitation is mild to moderate by color flow Doppler.  6. The tricuspid valve is normal in structure.  7. The aortic valve was not assessed There is severe thickening and severe calcifcation of the aortic valve. There is assessment of the aortic valve is difficult . No gradient was recorded. of the aortic valve.  8. The interatrial septum was not assessed. __________  [facility] 11/23/2018: Coronary Findings   Diagnostic  Dominance: Right  Left Main  Vessel was injected. Vessel is normal in caliber. Vessel is angiographically normal.  Left Anterior Descending  Ost LAD to Prox LAD lesion 90% stenosed  Ost LAD to Prox LAD lesion is 90% stenosed. The lesion is segmental.  Prox LAD to Mid LAD lesion 100% stenosed  Prox LAD to Mid  LAD lesion is 100% stenosed. The lesion was previously treated using a stent (unknown type) over 2 years ago.  Ramus Intermedius  Ost Ramus to Ramus lesion 100% stenosed  Ost Ramus to Ramus lesion is 100% stenosed.  Left Circumflex  Prox Cx lesion 95% stenosed  Prox Cx lesion is 95% stenosed. The lesion is focal.  First Obtuse Marginal Branch  Vessel is small in size.  Right Coronary Artery  Prox RCA lesion 50% stenosed  Prox RCA lesion is 50% stenosed.  Mid RCA to Dist RCA lesion 90% stenosed  Mid RCA to Dist RCA lesion is 90% stenosed. The lesion was previously treated.  LIMA LIMA Graft to Mid LAD  LIMA graft was visualized by angiography and is large. The graft exhibits no disease.  saphenous Graft to Ramus  SVG graft was visualized by angiography and is normal in caliber.  Origin to Prox Graft lesion 50% stenosed  Origin to Prox Graft lesion is 50% stenosed. The lesion is eccentric.  saphenous Graft to 2nd Diag  SVG graft was visualized by angiography and is normal in caliber.  Insertion lesion 40% stenosed  Insertion lesion is 40% stenosed.  saphenous  Graft to Ost RPDA  SVG graft was visualized by angiography and is large. The graft exhibits mild diffuse disease. The graft is severely ectatic.  Intervention   No interventions have been documented.  Right Heart   Right Heart Pressures Hemodynamic findings consistent with mild pulmonary hypertension.  Wall Motion   Resting               Left Heart   Left Ventricle The left ventricle is severely dilated. There is severe left ventricular systolic dysfunction. LV end diastolic pressure is mildly elevated. The left ventricular ejection fraction is less than 25% by visual estimate.  Aortic Valve There is mild aortic valve stenosis.  Coronary Diagrams   Diagnostic  Dominance: Right    Conclusion     Ost LAD to Prox LAD lesion is 90% stenosed.  Prox LAD to Mid LAD lesion is 100% stenosed.  Ost Ramus to Ramus lesion is 100% stenosed.  Prox Cx lesion is 95% stenosed.  Prox RCA lesion is 50% stenosed.  Mid RCA to Dist RCA lesion is 90% stenosed.  LIMA graft was visualized by angiography and is large.  The graft exhibits no disease.  SVG graft was visualized by angiography and is normal in caliber.  Origin to Prox Graft lesion is 50% stenosed.  SVG graft was visualized by angiography and is normal in caliber.  Insertion lesion is 40% stenosed.  SVG graft was visualized by angiography and is large.  The graft exhibits mild diffuse disease.  There is severe left ventricular systolic dysfunction.  The left ventricular ejection fraction is less than 25% by visual estimate.  LV end diastolic pressure is mildly elevated.  There is mild aortic valve stenosis.  Hemodynamic findings consistent with mild pulmonary hypertension.   1. Severe 3 vessel obstructive CAD 2. Patent LIMA to the LAD 3. Patent SVG to a large second diagonal. There is 40% stenosis at the graft anastomosis 4. Patent SVG to a ramus intermediate branch. There is an eccentric 50% stenosis in the  proximal SVG 5. Patent SVG to the RCA, this graft is has diffuse mild disease and is ectatic. 6. The LCx has a severe stenosis in the proximal to mid vessel supplying an OM. There is an acute angulation of the LCx from the left main 7. Mildly elevated  LV filling pressures with large V wave. 8. Mild to moderate pulmonary HTN 9. Severe LV dysfunction. EF is estimated at 15% and LV is severely dilated. 10. Mild Aortic stenosis by cath. Gradient is only 5 mm Hg. This may underestimate his valve severity due to low EF.   Plan: will discuss with primary team. Echo is pending. Consider PCI of the native LCx. I would anticipate this being challenging due to acute angulation of this vessel. If PCI planned will load with P2Y12 inhibitor.   __________  PCI 11/25/2018: Conclusion     Prox Cx lesion is 95% stenosed.  A drug-eluting stent was successfully placed using a STENT RESOLUTE ONYX 3.0X18.  Post intervention, there is a 0% residual stenosis.   1. Successful PCI of the LCx with DES x 1.  Plan: DAPT for one year. From an interventional standpoint he can be DC in 24 hours but other management of his rhythm and CHF may take longer. Will DC IV lidocaine.    Coronary Diagrams   Diagnostic  Dominance: Right    Intervention     _____________  Disposition   Pt is being discharged home today in good condition.  Follow-up Plans & Appointments    Follow-up Information    Bensimhon, Shaune Pascal, MD Follow up on 01/12/2019.   Specialty:  Cardiology Why:  Appointment time 11:40 AM Contact information: [address] [place] [address] [phone]        Minus Breeding, MD Follow up.   Specialty:  Cardiology Why:  A message has been sent to our office for hosptial follow up to be seen within 7-14 days. Contact information: [address] [place] [address] [phone]        Evans Lance, MD Follow up.   Specialty:  Cardiology Why:   A message has been sent to our office for the patient to be seen within 7-14 days. Contact information: [address] [place] [address] [phone]          Discharge Instructions    Amb Referral to Cardiac Rehabilitation   Complete by:  As directed    Diagnosis:  Coronary Stents      Discharge Medications   Allergies as of 11/26/2018      Reactions   Oxycodone-acetaminophen Nausea Only      Medication List    STOP taking these medications   carvedilol 6.25 MG tablet Commonly known as:  COREG     TAKE these medications   amiodarone 400 MG tablet Commonly known as:  PACERONE Take 1 tablet (400 mg total) by mouth 2 (two) times daily. For 7 days, then 200 mg daily thereafter   aspirin 81 MG tablet Take 81 mg by mouth daily.   atorvastatin 80 MG tablet Commonly known as:  LIPITOR Take 1 tablet (80 mg total) by mouth daily at 6 PM. What changed:    medication strength  how much to take  when to take this   clopidogrel 75 MG tablet Commonly known as:  PLAVIX Take 1 tablet (75 mg total) by mouth daily. Start taking on:  November 27, 2018   ENTRESTO 97-103 MG Generic drug:  sacubitril-valsartan TAKE 1 TABLET BY MOUTH 2 (TWO) TIMES DAILY What changed:  See the new instructions.   furosemide 20 MG tablet Commonly known as:  LASIX Take 1 tablet (20 mg total) by mouth daily. [place] for #90 days if insurance is better covered What changed:  additional instructions   potassium chloride 10 MEQ tablet Commonly known as:  KLOR-CON M20 Take 1 tablet (10 mEq total) by mouth daily. What changed:    medication strength  how much to take         Aspirin prescribed at discharge?  Yes High Intensity Statin Prescribed? (Lipitor 40-80mg  or Crestor 20-40mg ): Yes Beta Blocker Prescribed? No: Bradycardia For EF <40%, was ACEI/ARB Prescribed? Yes ADP Receptor Inhibitor Prescribed? (i.e. Plavix etc.-Includes Medically Managed Patients): Yes For EF  <40%, Aldosterone Inhibitor Prescribed? No: hypotension  Was EF assessed during THIS hospitalization? Yes Was Cardiac Rehab II ordered? (Included Medically managed Patients): Yes   Outstanding Labs/Studies   None   Duration of Discharge Encounter   Greater than 30 minutes including physician time.  Signed, Rise Mu, PA-C [facility] Pager: [phone] 11/26/2018, 10:47 AM

## 2018-11-26 NOTE — Progress Notes (Signed)
Patient c/o sudden hand swelling left>right hand , radial pulse present . Christell Faith PA notified and examined patient. Vascular US done ,with  result relayed to latter PA . Patient cleared for discharge.

## 2018-11-28 MED FILL — Nitroglycerin IV Soln 100 MCG/ML in D5W: INTRA_ARTERIAL | Qty: 10 | Status: AC

## 2018-12-07 ENCOUNTER — Ambulatory Visit (INDEPENDENT_AMBULATORY_CARE_PROVIDER_SITE_OTHER): Payer: Medicare Other | Admitting: Adult Health

## 2018-12-07 ENCOUNTER — Encounter: Payer: Self-pay | Admitting: Adult Health

## 2018-12-07 VITALS — BP 130/72 | HR 51 | Ht 71.0 in | Wt 199.2 lb

## 2018-12-07 DIAGNOSIS — I5022 Chronic systolic (congestive) heart failure: Secondary | ICD-10-CM

## 2018-12-07 DIAGNOSIS — I43 Cardiomyopathy in diseases classified elsewhere: Secondary | ICD-10-CM | POA: Diagnosis not present

## 2018-12-07 DIAGNOSIS — I10 Essential (primary) hypertension: Secondary | ICD-10-CM | POA: Diagnosis not present

## 2018-12-07 DIAGNOSIS — I2510 Atherosclerotic heart disease of native coronary artery without angina pectoris: Secondary | ICD-10-CM

## 2018-12-07 MED ORDER — AMIODARONE HCL 200 MG PO TABS: 200.0000 mg | ORAL_TABLET | Freq: Two times a day (BID) | ORAL | 6 refills | Status: DC

## 2018-12-07 NOTE — Patient Instructions (Signed)
Follow-Up: MAKE SURE TO KEEP YOUR SCHEDULED APPOINTMENTS  You will need a follow up appointment in 4 months WITH  Minus Breeding, MD , Jory Sims, DNP, AACC  or one of the following Advanced Practice Providers on your designated Care Team:  Rosaria Ferries, PA-C, Jory Sims, DNP, AACC   Medication Instructions:  NO CHANGES- Your physician recommends that you continue on your current medications as directed. Please refer to the Current Medication list given to you today. If you need a refill on your cardiac medications before your next appointment, please call your pharmacy. Labwork: When you have labs (blood work) and your tests are completely normal, you will receive your results ONLY by [facility] (if you have MyChart) -OR- A paper copy in the mail.  At [facility], you and your health needs are our priority.  As part of our continuing mission to provide you with exceptional heart care, we have created designated Provider Care Teams.  These Care Teams include your primary Cardiologist (physician) and Advanced Practice Providers (APPs -  Physician Assistants and Nurse Practitioners) who all work together to provide you with the care you need, when you need it.  Thank you for choosing [facility] at [facility]!!

## 2018-12-07 NOTE — Progress Notes (Signed)
Cardiology Office Note   Date:  12/07/2018   ID:  Stephen Adams, DOB 1946/06/14, MRN [id number]  PCP:  Josetta Huddle, MD  Cardiologist:  [facility]   Chief Complaint  Patient presents with  . Cardiomyopathy    Ischemic  . Coronary Artery Disease    s/p PCI to the proximal Cx  . Hospitalization Follow-up     History of Present Illness: Stephen Adams is a 73 y.o. male who presents for post hospitalization follow up after presenting to the ED and found to be in wide complex tachycardia rate of 140 bpm.   He has a history of CAD with multiple remote interventions s/p Inferior MI in 2001 with VF arrest, s/p CABG in 2002, ICD in situ with generator change in 2017, HRrEF EF of 25%, intermittent LBBB, PVC's, AAA, hypertensive heart disease, and HLD   He was started on amiodarone gtt and lidocaine gtt after boluses. He was emergently cardioverted X 1 with 120 j. He returned to paced rhythm at 40 bpm. Troponin was elevated leading to a RHC and LHC on 11/23/2018.   Cath revealed severe 3 vessel CAD with patent LIMA to LAD, patent SVG to large diagonal 2, with 40% stenosis at the graft anastomosis, patent SVG to ramus with an eccentric 50% stenosis in the proximal SVG, patent SVG to RCA with mild diffuse disease was was ectatic. The LCx had severe stenosis in the proximal to mid vessel supplying an OM. Mild to moderate pulmonary HTN.EF of 15%. Case was initially discussed with the primary regarding PCI of the native LCx (which was felt to be challenging due to acute angulation of the vessel). He subsequently underwent PCI/DES of proximal LCx on 11/25/2018. He was to continue DAPT for 12 months.   He was seen by the advanced heart failure team. His coreg was stopped due to bradycardia with recommendation for CPX . EP with noted AV conduction and narrow QRS at the time of his generator change in 2017. Since then, he had developed significant conduction system disease including LBBB. If was recommended to  consider upgraded device (BiV) given his LBBB and severe LV dysfunction, including addition of an atrial lead with the preference being to defer this to follow up.Possibe VT ablation consideration.   On discharge he was transitioned to po amiodarone, 400 mg BID for 7 days and then 200 mg daily thereafter. He has some LEE edema and was checked for DVT. Left upper extremity arterial duplex ultrasound showed no arterial abnormalities. There was a small superficial clot in the cephalic vein in the setting of his IV. He was advised to perform warm compresses.    He comes today feeling well. He is still recovering from his hospitalization and has some mild anxiety about this. He is very grateful to Dr. Harrell Gave for her care while he was hospitalized. [place], but not over exerting or lifting heavy objects. He denies recurrent chest pain, rapid HR or dizziness.    Past Medical History:  Diagnosis Date  . CAD (coronary artery disease)    a. 1989 PTCA LAD; b. 1999 BMS to LAD & RCA; c. 2000 PTCA RCA 2/2 ISR; d. 10/1999 Inf MI/VF Arrest: 4 stents to RCA 2/2 ISR; e. 05/2001 Cath: ISR RCA, sev LAD dzs, EF 35-40%-->CABG (details unknown); f. 09/2013 MV: Inferior infarct, no ischemia.  . Chronic systolic CHF (congestive heart failure) ([place])    a. 02/2015 Echo: EF 25-30%.  . DJD (degenerative joint disease)   .  Hyperlipidemia   . Hypertensive heart disease   . 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 10/2008 s/p MDT [employer] DR single lead AICD. Ser # [id number] H.  . Ischemic cardiomyopathy    a. 02/2015 Echo: EF 25-30%, inf, apical AK, mid-apical-anteroseptal and ant HK, Gr1 DD, mild AI, mod dil LA.  . Subluxation of interphalangeal joint of lesser toe   . Subluxation of metatarsophalangeal joint of lesser toe     Past Surgical History:  Procedure Laterality Date  . CORONARY ARTERY BYPASS GRAFT     4 vessel [employer].  Dr. Burns Spain 06/2000  . CORONARY  STENT INTERVENTION N/A 11/25/2018   Procedure: CORONARY STENT INTERVENTION;  Surgeon: Martinique, Peter M, MD;  Location: [place] [facility];  Service: Cardiovascular;  Laterality: N/A;  . EP IMPLANTABLE DEVICE N/A 05/05/2016   Procedure: ICD Generator Changeout;  Surgeon: Evans Lance, MD;  Location: [place] [facility];  Service: Cardiovascular;  Laterality: N/A;  . RIGHT/LEFT HEART CATH AND CORONARY/GRAFT ANGIOGRAPHY N/A 11/23/2018   Procedure: RIGHT/LEFT HEART CATH AND CORONARY/GRAFT ANGIOGRAPHY;  Surgeon: Martinique, Peter M, MD;  Location: [place] [facility];  Service: Cardiovascular;  Laterality: N/A;  . TOTAL KNEE ARTHROPLASTY Left   . TOTAL SHOULDER REPLACEMENT Bilateral      Current Outpatient Medications  Medication Sig Dispense Refill  . amiodarone (PACERONE) 200 MG tablet Take 1 tablet (200 mg total) by mouth 2 (two) times daily. 60 tablet 6  . aspirin 81 MG tablet Take 81 mg by mouth daily.    Marland Kitchen atorvastatin (LIPITOR) 80 MG tablet Take 1 tablet (80 mg total) by mouth daily at 6 PM. 30 tablet 0  . clopidogrel (PLAVIX) 75 MG tablet Take 1 tablet (75 mg total) by mouth daily. 30 tablet 11  . ENTRESTO 97-103 MG TAKE 1 TABLET BY MOUTH 2 (TWO) TIMES DAILY (Patient taking differently: Take 1 tablet by mouth 2 (two) times daily. ) 60 tablet 8  . furosemide (LASIX) 20 MG tablet Take 1 tablet (20 mg total) by mouth daily. [place] for #90 days if insurance is better covered (Patient taking differently: Take 20 mg by mouth daily. ) 30 tablet 12  . potassium chloride (K-DUR,KLOR-CON) 10 MEQ tablet Take 1 tablet (10 mEq total) by mouth daily. 30 tablet 0   No current facility-administered medications for this visit.     Allergies:   Oxycodone-acetaminophen    Social History:  The patient  reports that he has never smoked. He has never used smokeless tobacco. He reports current alcohol use. He reports that he does not use drugs.   Family History:  The patient's family history includes CAD in his mother;  Heart Problems in his father and maternal grandfather; Hypertension in his father; Pancreatic cancer in his mother.    ROS: All other systems are reviewed and negative. Unless otherwise mentioned in H&P    PHYSICAL EXAM: VS:  BP 130/72   Pulse (!) 51   Ht 5\' 11"  (1.803 m)   Wt 199 lb 3.2 oz (90.4 kg)   SpO2 96%   BMI 27.78 kg/m  , BMI Body mass index is 27.78 kg/m. GEN: Well nourished, well developed, in no acute distress HEENT: normal Neck: no JVD, carotid bruits, or masses Cardiac: RRR; 2/6 systolic murmur, rubs, or gallops,no edema  Respiratory:  Clear to auscultation bilaterally, normal work of breathing, some crackles in the base cleared with coughing.  GI: soft, nontender, nondistended, + BS MS: no deformity or atrophy.  Left arm antecubital area is well healed.  Skin: warm and dry, no rash Neuro:  Strength and sensation are intact Psych: euthymic mood, full affect   EKG:  Ventricular paced rhythm with intrinsic conduction. Rate of 51 bpm.   Recent Labs: 07/07/2018: BNP 3,214.6 11/22/2018: ALT 19 11/26/2018: BUN 10; Creatinine, Ser 0.89; Hemoglobin 11.7; Magnesium 1.8; Platelets 114; Potassium 4.4; Sodium 134    Lipid Panel    Component Value Date/Time   CHOL 119 04/26/2018 0919   TRIG 52 04/26/2018 0919   HDL 28 (L) 04/26/2018 0919   CHOLHDL 4.3 04/26/2018 0919   VLDL 10 04/26/2018 0919   LDLCALC 81 04/26/2018 0919      Wt Readings from Last 3 Encounters:  12/07/18 199 lb 3.2 oz (90.4 kg)  11/26/18 203 lb 14.8 oz (92.5 kg)  08/25/18 203 lb 9.6 oz (92.4 kg)   Other studies Reviewed: Echocardiogram 12/13/2018   1. The left ventricle has severely reduced systolic function, with an ejection fraction of 20-25%. The cavity size was mildly dilated. Left ventricular diastolic Doppler parameters are consistent with pseudonormalization.  2. There is akinesis / dyskinesis of the entire anteroseptal left ventricular segment.  3. The right ventricle has normal systolic  function. The cavity was normal. There is no increase in right ventricular wall thickness. Right ventricular systolic pressure is moderately elevated.  4. Left atrial size was mildly dilated.  5. The mitral valve is normal in structure. There is mild thickening and mild calcification. Mitral valve regurgitation is mild to moderate by color flow Doppler.  6. The tricuspid valve is normal in structure.  7. The aortic valve was not assessed There is severe thickening and severe calcifcation of the aortic valve. There is assessment of the aortic valve is difficult . No gradient was recorded. of the aortic valve.  8. The interatrial septum was not assessed.   Cardiac Cath 11/25/2018  Prox Cx lesion is 95% stenosed.  A drug-eluting stent was successfully placed using a STENT RESOLUTE ONYX 3.0X18.  Post intervention, there is a 0% residual stenosis.   1. Successful PCI of the LCx with DES x 1.  Plan: DAPT for one year. From an interventional standpoint  ASSESSMENT AND PLAN:  1. CAD: S/P cardiac cath with PCI to the proximal Cx artery Dec 13, 2018. Hx of CABG with LIMA to LAD, patent SVG to large diagonal 2, with 40% stenosis at the graft anastomosis, and patent SVG to RCA.  He remains on DAPT and is without complaint of chest pain, significant bruising or bleeding. 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   2. Ischemic Cardiomyopathy: Episode of VT during recent admission requiring cardioversion. He remains on amiodarone now at the 200 mg daily dose.  3. HRrEF: Most recent echo on 2018/12/13, demonstrated LVEF of 20%-25%. He is currently on Entresto 97/103 mg BID, furosemide 20 mg daily. He denies fluid retention. He appears euvolemic on exam today. He has follow up appointment with the Advanced Heart Failure clinic in March for more recommendations on his regimen.  4. ICD in site: To be seen on follow up by Dr. Lovena Le for discussion of need for upgrade to BiV device and or VT  ablation. Will defer to Dr. Lovena Le for recommendations on his appointment.   Current medicines are reviewed at length with the patient today.  I have spent over 30 minutes with this patient and his wife. Dr. Harrell Gave also stepped in to see the patient.   Labs/ tests ordered  today include: None   Phill Myron. West Pugh, ANP, AACC   12/07/2018 9:40 AM    [facility] [address] Office [phone] Fax [phone]

## 2018-12-22 ENCOUNTER — Encounter: Payer: Self-pay | Admitting: Internal Medicine

## 2018-12-22 ENCOUNTER — Other Ambulatory Visit: Payer: Self-pay

## 2018-12-22 ENCOUNTER — Ambulatory Visit (INDEPENDENT_AMBULATORY_CARE_PROVIDER_SITE_OTHER): Payer: Medicare Other | Admitting: Internal Medicine

## 2018-12-22 VITALS — BP 112/72 | HR 40 | Ht 71.0 in | Wt 200.0 lb

## 2018-12-22 DIAGNOSIS — I447 Left bundle-branch block, unspecified: Secondary | ICD-10-CM | POA: Diagnosis not present

## 2018-12-22 DIAGNOSIS — I2510 Atherosclerotic heart disease of native coronary artery without angina pectoris: Secondary | ICD-10-CM

## 2018-12-22 NOTE — Progress Notes (Signed)
HPI Mr. Nazaire returns today for ongoing evaluation and management of his ICD and systolic heart failure. He was in the hospital with VT. 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 He is mildly sob but does not feel bad and has remained fairly active despite pacing at 40. I suspect his cardiac output is poor as his EF is low.  Allergies  Allergen Reactions  . Oxycodone-Acetaminophen Nausea Only     Current Outpatient Medications  Medication Sig Dispense Refill  . amiodarone (PACERONE) 200 MG tablet Take 1 tablet (200 mg total) by mouth 2 (two) times daily. 60 tablet 6  . aspirin 81 MG tablet Take 81 mg by mouth daily.    Marland Kitchen atorvastatin (LIPITOR) 80 MG tablet Take 1 tablet (80 mg total) by mouth daily at 6 PM. 30 tablet 0  . clopidogrel (PLAVIX) 75 MG tablet Take 1 tablet (75 mg total) by mouth daily. 30 tablet 11  . ENTRESTO 97-103 MG TAKE 1 TABLET BY MOUTH 2 (TWO) TIMES DAILY (Patient taking differently: Take 1 tablet by mouth 2 (two) times daily. ) 60 tablet 8  . furosemide (LASIX) 20 MG tablet Take 1 tablet (20 mg total) by mouth daily. [place] for #90 days if insurance is better covered (Patient taking differently: Take 20 mg by mouth daily. ) 30 tablet 12  . potassium chloride (K-DUR,KLOR-CON) 10 MEQ tablet Take 1 tablet (10 mEq total) by mouth daily. 30 tablet 0   No current facility-administered medications for this visit.      Past Medical History:  Diagnosis Date  . CAD (coronary artery disease)    a. 1989 PTCA LAD; b. 1999 BMS to LAD & RCA; c. 2000 PTCA RCA 2/2 ISR; d. 10/1999 Inf MI/VF Arrest: 4 stents to RCA 2/2 ISR; e. 05/2001 Cath: ISR RCA, sev LAD dzs, EF 35-40%-->CABG (details unknown); f. 09/2013 MV: Inferior infarct, no ischemia.  . Chronic systolic CHF (congestive heart failure) ([place])    a. 02/2015 Echo: EF 25-30%.  . DJD (degenerative joint disease)   . Hyperlipidemia   .  Hypertensive heart disease   . 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 10/2008 s/p MDT [employer] single lead AICD. Ser # [id number] H.  . Ischemic cardiomyopathy    a. 02/2015 Echo: EF 25-30%, inf, apical AK, mid-apical-anteroseptal and ant HK, Gr1 DD, mild AI, mod dil LA.  . Subluxation of interphalangeal joint of lesser toe   . Subluxation of metatarsophalangeal joint of lesser toe     ROS:   All systems reviewed and negative except as noted in the HPI.   Past Surgical History:  Procedure Laterality Date  . CORONARY ARTERY BYPASS GRAFT     4 vessel [employer].  Dr. Burns Spain 06/2000  . CORONARY STENT INTERVENTION N/A 11/25/2018   Procedure: CORONARY STENT INTERVENTION;  Surgeon: Martinique, Peter M, MD;  Location: [place] [facility];  Service: Cardiovascular;  Laterality: N/A;  . EP IMPLANTABLE DEVICE N/A 05/05/2016   Procedure: ICD Generator Changeout;  Surgeon: Evans Lance, MD;  Location: [place] [facility];  Service: Cardiovascular;  Laterality: N/A;  . RIGHT/LEFT HEART CATH AND CORONARY/GRAFT ANGIOGRAPHY N/A 11/23/2018   Procedure: RIGHT/LEFT HEART CATH AND CORONARY/GRAFT ANGIOGRAPHY;  Surgeon: Martinique, Peter M, MD;  Location: [place] [facility];  Service: Cardiovascular;  Laterality: N/A;  . TOTAL KNEE ARTHROPLASTY Left   .  TOTAL SHOULDER REPLACEMENT Bilateral      Family History  Problem Relation Age of Onset  . Hypertension Father   . Heart Problems Father        bypass 1992  . CAD Mother   . Pancreatic cancer Mother   . Heart Problems Maternal Grandfather      Social History   Socioeconomic History  . Marital status: Married    Spouse name: Not on file  . Number of children: 4  . Years of education: Not on file  . Highest education level: Not on file  Occupational History  . Occupation: [employer]  . Financial resource strain: Not on file  . Food insecurity:    Worry: Not on file    Inability: Not on file  .  Transportation needs:    Medical: Not on file    Non-medical: Not on file  Tobacco Use  . Smoking status: Never Smoker  . Smokeless tobacco: Never Used  Substance and Sexual Activity  . Alcohol use: Yes    Alcohol/week: 0.0 standard drinks    Comment: occas  . Drug use: No  . Sexual activity: Not on file  Lifestyle  . Physical activity:    Days per week: Not on file    Minutes per session: Not on file  . Stress: Not on file  Relationships  . Social connections:    Talks on phone: Not on file    Gets together: Not on file    Attends religious service: Not on file    Active member of club or organization: Not on file    Attends meetings of clubs or organizations: Not on file    Relationship status: Not on file  . Intimate partner violence:    Fear of current or ex partner: Not on file    Emotionally abused: Not on file    Physically abused: Not on file    Forced sexual activity: Not on file  Other Topics Concern  . 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       BP 112/72   Pulse (!) 40   Ht 5\' 11"  (1.803 m)   Wt 200 lb (90.7 kg)   SpO2 99%   BMI 27.89 kg/m   Physical Exam:  Stable but chroically ill appearing NAD HEENT: Unremarkable Neck:  7 cm JVD, no thyromegally Lymphatics:  No adenopathy Back:  No CVA tenderness Lungs:  Clear with no wheezes HEART:  Regular brady rhythm, no murmurs, no rubs, no clicks Abd:  soft, positive bowel sounds, no organomegally, no rebound, no guarding Ext:  2 plus pulses, no edema, no cyanosis, no clubbing Skin:  No rashes no nodules Neuro:  CN II through XII intact, motor grossly intact  EKG - sinus brady with ventricular pacing  DEVICE  Normal device function.  See PaceArt for details.   Assess/Plan: 1. VT - he has had no recurrent VT on amiodarone.  2. CHB - he has no conduction today and is paced at 30/min. At baseline he is in NSR but no conduction. We hoped that his AV  conduction would improve but no indication at the present time. 3. Chronic systolic heart failure - I discussed upgrade to a biv ICD. I think that this will benefit him. There is a question as to whether he will need more therapy. I discussed biv upgrade. 4. ICD - his medtronic device is  working normally. We will recheck in several months.   Mikle Bosworth.D.

## 2018-12-22 NOTE — Patient Instructions (Signed)
Medication Instructions:  No changes If you need a refill on your cardiac medications before your next appointment, please call your pharmacy.   Lab work: none If you have labs (blood work) drawn today and your tests are completely normal, you will receive your results only by: Marland Kitchen MyChart Message (if you have MyChart) OR . A paper copy in the mail If you have any lab test that is abnormal or we need to change your treatment, we will call you to review the results.  Testing/Procedures: none  Follow-Up: Keep on same medicines, Dr. Lovena Le will discuss with Dr. Martinique and we will be in touch regarding plan for your device.  Any Other Special Instructions Will Be Listed Below (If Applicable).

## 2018-12-24 ENCOUNTER — Other Ambulatory Visit (HOSPITAL_COMMUNITY): Payer: Self-pay | Admitting: Physician Assistant

## 2018-12-26 NOTE — Telephone Encounter (Signed)
Refill Request.  

## 2018-12-27 LAB — CUP PACEART INCLINIC DEVICE CHECK
Battery Remaining Longevity: 110 mo
Battery Voltage: 2.99 V
Brady Statistic RV Percent Paced: 59.16 %
Date Time Interrogation Session: 20200312145024
HighPow Impedance: 39 Ohm
HighPow Impedance: 51 Ohm
Implantable Lead Implant Date: 20100104
Implantable Lead Location: 753860
Implantable Lead Model: 6947
Implantable Pulse Generator Implant Date: 20170725
Lead Channel Impedance Value: 304 Ohm
Lead Channel Impedance Value: 361 Ohm
Lead Channel Pacing Threshold Pulse Width: 0.4 ms
Lead Channel Sensing Intrinsic Amplitude: 9.375 mV
Lead Channel Setting Pacing Amplitude: 3.5 V
Lead Channel Setting Pacing Pulse Width: 0.4 ms
Lead Channel Setting Sensing Sensitivity: 0.3 mV
MDC IDC MSMT LEADCHNL RV PACING THRESHOLD AMPLITUDE: 1.75 V
MDC IDC MSMT LEADCHNL RV SENSING INTR AMPL: 9.375 mV

## 2018-12-28 ENCOUNTER — Encounter: Payer: Self-pay | Admitting: Internal Medicine

## 2019-01-10 ENCOUNTER — Telehealth: Payer: Self-pay

## 2019-01-10 NOTE — Telephone Encounter (Signed)
Call placed to Stephen Adams per Dr. Lovena Le request.  Stephen Adams states he is feeling good.  Is walking a couple of miles a day.  Has been social distancing with his wife.  Stephen Adams states he has an appt with Dr. Haroldine Laws this week.  Is hoping to decide at that visit whether he should consider upgrade.  Advised appt made for May with GT, but if Dr. B thinks he should be seen sooner he will let us know.  Stephen Adams thanked nurse for call.

## 2019-01-12 ENCOUNTER — Telehealth (HOSPITAL_COMMUNITY): Payer: Self-pay | Admitting: Internal Medicine

## 2019-01-13 ENCOUNTER — Ambulatory Visit (HOSPITAL_COMMUNITY)
Admission: RE | Admit: 2019-01-13 | Discharge: 2019-01-13 | Disposition: A | Discharge status: Home / Self Care | Payer: Medicare Other | Source: Ambulatory Visit | Attending: Internal Medicine | Admitting: Internal Medicine

## 2019-01-13 ENCOUNTER — Encounter (HOSPITAL_COMMUNITY): Payer: Self-pay

## 2019-01-13 ENCOUNTER — Other Ambulatory Visit: Payer: Self-pay

## 2019-01-13 VITALS — BP 196/59 | HR 52 | Wt 198.0 lb

## 2019-01-13 DIAGNOSIS — I255 Ischemic cardiomyopathy: Secondary | ICD-10-CM | POA: Diagnosis not present

## 2019-01-13 DIAGNOSIS — I5022 Chronic systolic (congestive) heart failure: Secondary | ICD-10-CM

## 2019-01-13 DIAGNOSIS — Z95810 Presence of automatic (implantable) cardiac defibrillator: Secondary | ICD-10-CM | POA: Diagnosis not present

## 2019-01-13 DIAGNOSIS — I472 Ventricular tachycardia, unspecified: Secondary | ICD-10-CM

## 2019-01-13 DIAGNOSIS — I2510 Atherosclerotic heart disease of native coronary artery without angina pectoris: Secondary | ICD-10-CM | POA: Diagnosis not present

## 2019-01-13 MED ORDER — FUROSEMIDE 20 MG PO TABS: 20.0000 mg | ORAL_TABLET | Freq: Every day | ORAL | 12 refills | Status: AC

## 2019-01-13 MED ORDER — ATORVASTATIN CALCIUM 80 MG PO TABS: 80.0000 mg | ORAL_TABLET | Freq: Every day | ORAL | 12 refills | Status: AC

## 2019-01-13 MED ORDER — AMIODARONE HCL 200 MG PO TABS: 200.0000 mg | ORAL_TABLET | Freq: Every day | ORAL | 6 refills | Status: AC

## 2019-01-13 NOTE — Patient Instructions (Signed)
   Follow up in 6 weeks with Dr Haroldine Laws.   Please cut back amiodarone to 200 mg daily.   I have sent a message to EP about your device upgrade.

## 2019-01-13 NOTE — Progress Notes (Signed)
Heart Failure TeleHealth Note  Due to national recommendations of social distancing due to [place] 19, Audio/video telehealth visit is felt to be most appropriate for this patient at this time.  See MyChart message from today for patient consent regarding telehealth for [facility].  Date:  01/13/2019   ID:  Stephen Adams, DOB [birth year], MRN [id number]  Location: Home  Provider location: [address] [place] Type of Visit:  Established patient  PCP:  Josetta Huddle, MD  Cardiologist:  Minus Breeding, MD Primary HF: Dr Haroldine Laws   Chief Complaint: Heart Failure History of Present Illness: Stephen Adams is a 73 y.o. male who presents via audio conferencing for a telehealth visit today.     Stephen Adams is a 20 year old with a history of IMD, chronic systolic heart failure, single chamber ICD (medtronic), CAD, S/P CABG , HTN, hyperlipidemia, and AAA.   Admitted with VT and chest pain. Device interrogation showed VT. He was placed on amiodarone and lidocaine drip. He later transitioned to amiodarone 400 mg twice a day. RHC/LHC performed. This showed preserved cardiac output and  severe coronary disease/patent grafts, and severe stenosis LCx. On 11/25/18 he underwent PCI/DES to proximal LCx. Recommended to continue DAPT for 12 months.   On March 12th he was evaluated by Dr Lovena Le. Device upgrade is being considered.   He would like to pursue device upgrade as soon as he can. Today, he denies symptoms of palpitations, chest pain, shortness of breath, orthopnea, PND, lower extremity edema, claudication, dizziness, presyncope, syncope, or bleeding.  Walking 2 miles a day. Weight 196-199 pounds. The patient is tolerating medications without difficulties and is otherwise without complaint today.    He denies symptoms of cough, fevers, chills, or new SOB worrisome for COVID 19.    ECHO 11/23/18-EF 20-25%, RV normal.  Past Medical History:  Diagnosis Date  . CAD (coronary artery  disease)    a. 1989 PTCA LAD; b. 1999 BMS to LAD & RCA; c. 2000 PTCA RCA 2/2 ISR; d. 10/1999 Inf MI/VF Arrest: 4 stents to RCA 2/2 ISR; e. 05/2001 Cath: ISR RCA, sev LAD dzs, EF 35-40%-->CABG (details unknown); f. 09/2013 MV: Inferior infarct, no ischemia.  . Chronic systolic CHF (congestive heart failure) ([place])    a. 02/2015 Echo: EF 25-30%.  . DJD (degenerative joint disease)   . Hyperlipidemia   . Hypertensive heart disease   . 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 10/2008 s/p MDT [employer] DR single lead AICD. Ser # [id number].  . Ischemic cardiomyopathy    a. 02/2015 Echo: EF 25-30%, inf, apical AK, mid-apical-anteroseptal and ant HK, Gr1 DD, mild AI, mod dil LA.  . Subluxation of interphalangeal joint of lesser toe   . Subluxation of metatarsophalangeal joint of lesser toe    Past Surgical History:  Procedure Laterality Date  . CORONARY ARTERY BYPASS GRAFT     4 vessel [employer].  Dr. Burns Spain 06/2000  . CORONARY STENT INTERVENTION N/A 11/25/2018   Procedure: CORONARY STENT INTERVENTION;  Surgeon: Martinique, Peter M, MD;  Location: [place] [facility];  Service: Cardiovascular;  Laterality: N/A;  . EP IMPLANTABLE DEVICE N/A 05/05/2016   Procedure: ICD Generator Changeout;  Surgeon: Evans Lance, MD;  Location: [place] [facility];  Service: Cardiovascular;  Laterality: N/A;  . RIGHT/LEFT HEART CATH AND CORONARY/GRAFT ANGIOGRAPHY N/A 11/23/2018   Procedure: RIGHT/LEFT HEART CATH AND CORONARY/GRAFT ANGIOGRAPHY;  Surgeon: Martinique, Peter M, MD;  Location: [place];  Service: Cardiovascular;  Laterality: N/A;  . TOTAL KNEE ARTHROPLASTY Left   . TOTAL SHOULDER REPLACEMENT Bilateral      Current Outpatient Medications  Medication Sig Dispense Refill  . amiodarone (PACERONE) 200 MG tablet Take 1 tablet (200 mg total) by mouth 2 (two) times daily. 60 tablet 6  . aspirin 81 MG tablet Take 81 mg by mouth daily.    Marland Kitchen atorvastatin (LIPITOR) 80 MG tablet TAKE 1  TABLET (80 MG TOTAL) BY MOUTH DAILY AT 6 PM. 90 tablet 0  . clopidogrel (PLAVIX) 75 MG tablet Take 1 tablet (75 mg total) by mouth daily. 30 tablet 11  . ENTRESTO 97-103 MG TAKE 1 TABLET BY MOUTH 2 (TWO) TIMES DAILY (Patient taking differently: Take 1 tablet by mouth 2 (two) times daily. ) 60 tablet 8  . furosemide (LASIX) 20 MG tablet Take 1 tablet (20 mg total) by mouth daily. [place] for #90 days if insurance is better covered (Patient taking differently: Take 20 mg by mouth daily. ) 30 tablet 12  . KLOR-CON M10 10 MEQ tablet TAKE 1 TABLET BY MOUTH EVERY DAY 90 tablet 3   No current facility-administered medications for this encounter.     Allergies:   Oxycodone-acetaminophen   Social History:  The patient  reports that he has never smoked. He has never used smokeless tobacco. He reports current alcohol use. He reports that he does not use drugs.   Family History:  The patient's family history includes CAD in his mother; Heart Problems in his father and maternal grandfather; Hypertension in his father; Pancreatic cancer in his mother.   ROS:  Please see the history of present illness.   All other systems are personally reviewed and negative.   Exam:  Tele Health Call; Exam is subjective General:   No resp difficulty. Cor: reported regular pulse  Lungs: Normal respiratory effort with conversation.  Abdomen: Non-distended. Pt denies tenderness with self palpation.  Extremities: Pt denies edema. Neuro: Alert & oriented x 3.   Recent Labs: 07/07/2018: BNP 3,214.6 11/22/2018: ALT 19 11/26/2018: BUN 10; Creatinine, Ser 0.89; Hemoglobin 11.7; Magnesium 1.8; Platelets 114; Potassium 4.4; Sodium 134  Personally reviewed   Wt Readings from Last 3 Encounters:  12/22/18 90.7 kg (200 lb)  12/07/18 90.4 kg (199 lb 3.2 oz)  11/26/18 92.5 kg (203 lb 14.8 oz)      Other studies personally reviewed: Additional studies/ records that were reviewed today include: ECHO and heart catherization.    ASSESSMENT AND PLAN:  1.  Chronic Systolic Heart Failure, ICM  ECHO 11/2018 EF 20-25%. Has LBBB EP recommending BiV. He would like to pursue when elective procedures are available.  NYHA II. Volume status sound stable.  Continue current HF medications. No change.  Not on bb with bradycardia.  He will also need CPX when we are able to perform for risk stratification. Needs BMET at nexT OV.   2. CAD LHC 12/02/18 with severe 3 vessel obstructive CAD. He underwent PCI /DES proximal LCX.  Continue DAPT for 1 year.  No s/s ischemia. Continue statin, asa, plavix.   3. VT  Cut back amiodarone to 200 mg daily.   4. CHB  Followed by EP.   COVID screen The patient does not have any symptoms that suggest any further testing/ screening at this time.  Social distancing reinforced today.  Recommended follow-up:  6 weeks.   Relevant cardiac medications were reviewed at length with the patient today.   The patient  does not have concerns regarding their medications at this time.   The following changes were made today:  Cut back amio to 200 mg daily.   Labs/ tests ordered today include: none   Patient Risk: After full review of this patients clinical status, I feel that they are at moderate risk for cardiac decompensation at this time.  Today, I have spent  25 minutes with the patient with telehealth technology discussing .    Jeanmarie Hubert, NP  01/13/2019 8:40 AM  [facility] [place] [facility] [place] [address] [phone] (office) [phone] (fax)

## 2019-01-16 ENCOUNTER — Telehealth: Payer: Self-pay | Admitting: Internal Medicine

## 2019-01-16 DIAGNOSIS — I5022 Chronic systolic (congestive) heart failure: Secondary | ICD-10-CM

## 2019-01-16 DIAGNOSIS — I442 Atrioventricular block, complete: Secondary | ICD-10-CM

## 2019-01-16 NOTE — Telephone Encounter (Signed)
New Message:    Pt says he wants to talk to Dr Lovena Le or his nurse. He says he needs a pacemaker put in, he says hs heart rate is down real low. He said he saw Dr Tempie Hoist last week and he was supposed to be talking to Dr Lovena Le about this.Stephen Adams

## 2019-01-18 NOTE — Telephone Encounter (Signed)
° ° °  Patient calling to report HR   STAT if HR is under 50 or over 120 (normal HR is 60-100 beats per minute)  1) What is your heart rate? 40  2) Do you have a log of your heart rate readings (document readings)? 40  3) Do you have any other symptoms? lightheaded

## 2019-01-19 ENCOUNTER — Telehealth: Payer: Self-pay | Admitting: Adult Health

## 2019-01-19 NOTE — Telephone Encounter (Signed)
Duplicate conversation thread.  See thread started on 01/16/2019.  This nurse is aware and working on this situation.

## 2019-01-19 NOTE — Telephone Encounter (Signed)
Spoke with pt and was wandering if Dr Lovena Le ever spoke with Dr Haroldine Laws re inserting a different device Per pt HR is consistently  40 and the other day noted dizziness (lightheaded) B/P is running around 100/68 Will forward to Dr Lovena Le for review and recommendations .Pt is very concerned./cy

## 2019-01-19 NOTE — Telephone Encounter (Signed)
New Message   Patient states he seen Jory Sims when he was hospitalized and would like to talk to her.

## 2019-01-19 NOTE — Telephone Encounter (Signed)
Returned call to Pt.  Advised per Dr. Raiford Noble get Pt set up for BIV ICD upgrade at next available time.  Scheduled for January 25, 2019 at 8:30 am.  Pt will come to office 01/20/2019 to get labs and pick up instruction letter/soap.  All questions answered.

## 2019-01-20 ENCOUNTER — Other Ambulatory Visit: Payer: Self-pay

## 2019-01-20 ENCOUNTER — Other Ambulatory Visit: Payer: Medicare Other

## 2019-01-20 DIAGNOSIS — I442 Atrioventricular block, complete: Secondary | ICD-10-CM | POA: Diagnosis not present

## 2019-01-20 DIAGNOSIS — I5022 Chronic systolic (congestive) heart failure: Secondary | ICD-10-CM

## 2019-01-20 LAB — CBC WITH DIFFERENTIAL/PLATELET
Basophils Absolute: 0.1 10*3/uL (ref 0.0–0.2)
Basos: 1 %
EOS (ABSOLUTE): 0.1 10*3/uL (ref 0.0–0.4)
Eos: 3 %
Hematocrit: 46.7 % (ref 37.5–51.0)
Hemoglobin: 15.1 g/dL (ref 13.0–17.7)
Immature Grans (Abs): 0 10*3/uL (ref 0.0–0.1)
Immature Granulocytes: 0 %
Lymphocytes Absolute: 1.1 10*3/uL (ref 0.7–3.1)
Lymphs: 25 %
MCH: 30.7 pg (ref 26.6–33.0)
MCHC: 32.3 g/dL (ref 31.5–35.7)
MCV: 95 fL (ref 79–97)
Monocytes Absolute: 0.6 10*3/uL (ref 0.1–0.9)
Monocytes: 12 %
Neutrophils Absolute: 2.6 10*3/uL (ref 1.4–7.0)
Neutrophils: 59 %
Platelets: 184 10*3/uL (ref 150–450)
RBC: 4.92 x10E6/uL (ref 4.14–5.80)
RDW: 13.1 % (ref 11.6–15.4)
WBC: 4.5 10*3/uL (ref 3.4–10.8)

## 2019-01-20 LAB — BASIC METABOLIC PANEL
BUN/Creatinine Ratio: 13 (ref 10–24)
BUN: 13 mg/dL (ref 8–27)
CO2: 22 mmol/L (ref 20–29)
Calcium: 9.4 mg/dL (ref 8.6–10.2)
Chloride: 98 mmol/L (ref 96–106)
Creatinine, Ser: 1.04 mg/dL (ref 0.76–1.27)
GFR calc Af Amer: 83 mL/min/{1.73_m2} (ref >59)
GFR calc non Af Amer: 71 mL/min/{1.73_m2} (ref >59)
Glucose: 104 mg/dL — ABNORMAL HIGH (ref 65–99)
Potassium: 4.9 mmol/L (ref 3.5–5.2)
Sodium: 136 mmol/L (ref 134–144)

## 2019-01-25 ENCOUNTER — Ambulatory Visit (HOSPITAL_COMMUNITY)
Admission: RE | Disposition: A | Discharge status: Home / Self Care | Payer: Self-pay | Source: Home / Self Care | Attending: Internal Medicine

## 2019-01-25 ENCOUNTER — Encounter (HOSPITAL_COMMUNITY): Payer: Self-pay | Admitting: Internal Medicine

## 2019-01-25 ENCOUNTER — Other Ambulatory Visit: Payer: Self-pay

## 2019-01-25 ENCOUNTER — Ambulatory Visit (HOSPITAL_COMMUNITY): Payer: Medicare Other

## 2019-01-25 ENCOUNTER — Ambulatory Visit (HOSPITAL_COMMUNITY)
Admission: RE | Admit: 2019-01-25 | Discharge: 2019-01-26 | Disposition: A | Discharge status: Home / Self Care | Payer: Medicare Other | Attending: Internal Medicine | Admitting: Internal Medicine

## 2019-01-25 DIAGNOSIS — I442 Atrioventricular block, complete: Secondary | ICD-10-CM | POA: Insufficient documentation

## 2019-01-25 DIAGNOSIS — Z885 Allergy status to narcotic agent status: Secondary | ICD-10-CM | POA: Insufficient documentation

## 2019-01-25 DIAGNOSIS — Z951 Presence of aortocoronary bypass graft: Secondary | ICD-10-CM | POA: Insufficient documentation

## 2019-01-25 DIAGNOSIS — I447 Left bundle-branch block, unspecified: Secondary | ICD-10-CM | POA: Diagnosis not present

## 2019-01-25 DIAGNOSIS — I5022 Chronic systolic (congestive) heart failure: Secondary | ICD-10-CM | POA: Diagnosis not present

## 2019-01-25 DIAGNOSIS — Z955 Presence of coronary angioplasty implant and graft: Secondary | ICD-10-CM | POA: Insufficient documentation

## 2019-01-25 DIAGNOSIS — I2510 Atherosclerotic heart disease of native coronary artery without angina pectoris: Secondary | ICD-10-CM | POA: Insufficient documentation

## 2019-01-25 DIAGNOSIS — Z8249 Family history of ischemic heart disease and other diseases of the circulatory system: Secondary | ICD-10-CM | POA: Diagnosis not present

## 2019-01-25 DIAGNOSIS — Z7982 Long term (current) use of aspirin: Secondary | ICD-10-CM | POA: Diagnosis not present

## 2019-01-25 DIAGNOSIS — I110 Hypertensive heart disease with heart failure: Secondary | ICD-10-CM | POA: Insufficient documentation

## 2019-01-25 DIAGNOSIS — I472 Ventricular tachycardia, unspecified: Secondary | ICD-10-CM

## 2019-01-25 DIAGNOSIS — Z95810 Presence of automatic (implantable) cardiac defibrillator: Secondary | ICD-10-CM

## 2019-01-25 DIAGNOSIS — M1990 Unspecified osteoarthritis, unspecified site: Secondary | ICD-10-CM | POA: Insufficient documentation

## 2019-01-25 DIAGNOSIS — R0602 Shortness of breath: Secondary | ICD-10-CM | POA: Insufficient documentation

## 2019-01-25 DIAGNOSIS — Z006 Encounter for examination for normal comparison and control in clinical research program: Secondary | ICD-10-CM | POA: Diagnosis not present

## 2019-01-25 DIAGNOSIS — Z7902 Long term (current) use of antithrombotics/antiplatelets: Secondary | ICD-10-CM | POA: Insufficient documentation

## 2019-01-25 DIAGNOSIS — I700 Atherosclerosis of aorta: Secondary | ICD-10-CM | POA: Insufficient documentation

## 2019-01-25 DIAGNOSIS — Z79899 Other long term (current) drug therapy: Secondary | ICD-10-CM | POA: Insufficient documentation

## 2019-01-25 DIAGNOSIS — E785 Hyperlipidemia, unspecified: Secondary | ICD-10-CM | POA: Diagnosis not present

## 2019-01-25 DIAGNOSIS — I255 Ischemic cardiomyopathy: Secondary | ICD-10-CM | POA: Insufficient documentation

## 2019-01-25 DIAGNOSIS — I517 Cardiomegaly: Secondary | ICD-10-CM | POA: Diagnosis not present

## 2019-01-25 HISTORY — PX: BIV UPGRADE: EP1202

## 2019-01-25 LAB — SURGICAL PCR SCREEN
MRSA, PCR: NEGATIVE
Staphylococcus aureus: POSITIVE — AB

## 2019-01-25 SURGERY — BIV UPGRADE

## 2019-01-25 MED ORDER — LIDOCAINE HCL (PF) 1 % IJ SOLN
INTRAMUSCULAR | Status: DC | PRN
  Administered 2019-01-25: 60 mL

## 2019-01-25 MED ORDER — ATORVASTATIN CALCIUM 80 MG PO TABS
80.0000 mg | ORAL_TABLET | Freq: Every day | ORAL | Status: DC
  Administered 2019-01-25: 80 mg via ORAL
  Filled 2019-01-25: qty 1

## 2019-01-25 MED ORDER — FENTANYL CITRATE (PF) 100 MCG/2ML IJ SOLN
INTRAMUSCULAR | Status: DC | PRN
  Administered 2019-01-25 (×7): 12.5 ug via INTRAVENOUS

## 2019-01-25 MED ORDER — IOHEXOL 350 MG/ML SOLN
INTRAVENOUS | Status: DC | PRN
  Administered 2019-01-25: 10 mL via INTRAVENOUS

## 2019-01-25 MED ORDER — CEFAZOLIN SODIUM-DEXTROSE 2-4 GM/100ML-% IV SOLN
INTRAVENOUS | Status: AC
  Filled 2019-01-25: qty 100

## 2019-01-25 MED ORDER — MIDAZOLAM HCL 5 MG/5ML IJ SOLN
INTRAMUSCULAR | Status: AC
  Filled 2019-01-25: qty 5

## 2019-01-25 MED ORDER — CHLORHEXIDINE GLUCONATE 4 % EX LIQD
60.0000 mL | Freq: Once | CUTANEOUS | Status: DC
  Filled 2019-01-25: qty 60

## 2019-01-25 MED ORDER — ACETAMINOPHEN 325 MG PO TABS
325.0000 mg | ORAL_TABLET | ORAL | Status: DC | PRN
  Administered 2019-01-25: 650 mg via ORAL
  Filled 2019-01-25: qty 2

## 2019-01-25 MED ORDER — SODIUM CHLORIDE 0.9 % IV SOLN
80.0000 mg | INTRAVENOUS | Status: AC
  Administered 2019-01-25: 80 mg
  Filled 2019-01-25: qty 2

## 2019-01-25 MED ORDER — FENTANYL CITRATE (PF) 100 MCG/2ML IJ SOLN
INTRAMUSCULAR | Status: AC
  Filled 2019-01-25: qty 2

## 2019-01-25 MED ORDER — HEPARIN (PORCINE) IN NACL 1000-0.9 UT/500ML-% IV SOLN
INTRAVENOUS | Status: DC | PRN
  Administered 2019-01-25: 500 mL

## 2019-01-25 MED ORDER — LIDOCAINE HCL 1 % IJ SOLN
INTRAMUSCULAR | Status: AC
  Filled 2019-01-25: qty 60

## 2019-01-25 MED ORDER — CEFAZOLIN SODIUM-DEXTROSE 1-4 GM/50ML-% IV SOLN
1.0000 g | Freq: Four times a day (QID) | INTRAVENOUS | Status: AC
  Administered 2019-01-25 – 2019-01-26 (×3): 1 g via INTRAVENOUS
  Filled 2019-01-25 (×3): qty 50

## 2019-01-25 MED ORDER — ONDANSETRON HCL 4 MG/2ML IJ SOLN: 4.0000 mg | Freq: Four times a day (QID) | INTRAMUSCULAR | Status: DC | PRN

## 2019-01-25 MED ORDER — SODIUM CHLORIDE 0.9 % IV SOLN
INTRAVENOUS | Status: AC
  Filled 2019-01-25: qty 2

## 2019-01-25 MED ORDER — CEFAZOLIN SODIUM-DEXTROSE 2-4 GM/100ML-% IV SOLN
2.0000 g | INTRAVENOUS | Status: AC
  Administered 2019-01-25: 2 g via INTRAVENOUS
  Filled 2019-01-25: qty 100

## 2019-01-25 MED ORDER — POTASSIUM CHLORIDE CRYS ER 10 MEQ PO TBCR
10.0000 meq | EXTENDED_RELEASE_TABLET | Freq: Every day | ORAL | Status: DC
  Administered 2019-01-25 – 2019-01-26 (×2): 10 meq via ORAL
  Filled 2019-01-25 (×2): qty 1

## 2019-01-25 MED ORDER — SACUBITRIL-VALSARTAN 97-103 MG PO TABS
1.0000 | ORAL_TABLET | Freq: Two times a day (BID) | ORAL | Status: DC
  Administered 2019-01-25 – 2019-01-26 (×2): 1 via ORAL
  Filled 2019-01-25 (×2): qty 1

## 2019-01-25 MED ORDER — SODIUM CHLORIDE 0.9 % IV SOLN
INTRAVENOUS | Status: DC
  Administered 2019-01-25: 07:00:00 via INTRAVENOUS

## 2019-01-25 MED ORDER — FUROSEMIDE 20 MG PO TABS
20.0000 mg | ORAL_TABLET | Freq: Every day | ORAL | Status: DC
  Administered 2019-01-25 – 2019-01-26 (×2): 20 mg via ORAL
  Filled 2019-01-25 (×2): qty 1

## 2019-01-25 MED ORDER — HEPARIN (PORCINE) IN NACL 1000-0.9 UT/500ML-% IV SOLN
INTRAVENOUS | Status: AC
  Filled 2019-01-25: qty 500

## 2019-01-25 MED ORDER — MUPIROCIN 2 % EX OINT
1.0000 "application " | TOPICAL_OINTMENT | Freq: Once | CUTANEOUS | Status: AC
  Administered 2019-01-25: 1 via TOPICAL
  Filled 2019-01-25 (×2): qty 22

## 2019-01-25 MED ORDER — MIDAZOLAM HCL 5 MG/5ML IJ SOLN
INTRAMUSCULAR | Status: DC | PRN
  Administered 2019-01-25 (×7): 1 mg via INTRAVENOUS

## 2019-01-25 MED ORDER — AMIODARONE HCL 200 MG PO TABS
200.0000 mg | ORAL_TABLET | Freq: Every day | ORAL | Status: DC
  Administered 2019-01-26: 200 mg via ORAL
  Filled 2019-01-25: qty 1

## 2019-01-25 SURGICAL SUPPLY — 16 items
ADAPTER SEALING SSA-EW-09 (MISCELLANEOUS) ×2 IMPLANT
CABLE SURGICAL S-101-97-12 (CABLE) ×2 IMPLANT
CATH ATTAIN COM SURV 6250V-EH (CATHETERS) ×2 IMPLANT
CATH ATTAIN COM SURV 6250V-MB2 (CATHETERS) ×2 IMPLANT
CATH HEX JOS 2-5-2 65CM 6F REP (CATHETERS) ×2 IMPLANT
GUIDEWIRE ANGLED .035X150CM (WIRE) ×2 IMPLANT
ICD CLARIA MRI DTMA1Q1 (ICD Generator) ×2 IMPLANT
KIT ESSENTIALS PG (KITS) ×2 IMPLANT
LEAD ATTAIN PERFORMA S 4598-88 (Lead) ×2 IMPLANT
LEAD CAPSURE NOVUS 5076-52CM (Lead) ×2 IMPLANT
PAD PRO RADIOLUCENT 2001M-C (PAD) ×2 IMPLANT
SHEATH CLASSIC 7F (SHEATH) ×2 IMPLANT
SHEATH CLASSIC 9.5F (SHEATH) ×2 IMPLANT
SLITTER 6232ADJ (MISCELLANEOUS) ×2 IMPLANT
TRAY PACEMAKER INSERTION (PACKS) ×2 IMPLANT
WIRE ACUITY WHISPER EDS 4648 (WIRE) ×2 IMPLANT

## 2019-01-25 NOTE — Discharge Summary (Signed)
ELECTROPHYSIOLOGY PROCEDURE DISCHARGE SUMMARY    Patient ID: Stephen Adams,  MRN: [id number], DOB/AGE: 1946/06/10 73 y.o.  Admit date: 01/25/2019 Discharge date: 01/26/2019  Primary Care Physician: Josetta Huddle, MD  Primary Cardiologist: Dr. Percival Spanish AHF: Dr. Haroldine Laws Electrophysiologist: Dr. Lovena Le  Primary Discharge Diagnosis:  1. CHB 2. LBBB  Secondary Discharge Diagnosis:  1. VT (prior admission) 2. ICM     Chronic CHF (systolic) 3. CAD     LHC/PCI to LCX 11/25/18     On ASA, Plavix, statin 4. HTN 5. HLD    Allergies  Allergen Reactions   Oxycodone-Acetaminophen Nausea Only     Procedures This Admission:  1.  Upgrade of a single chamber ICD to CRT- ICD on 01/25/2019 by Dr Lovena Le.  The patient received a The Medtronic quadripolar LV pacing lead, serial number [id number] (LV lead), Medtronic model 5076 active-fixation pacing lead, serial number [id number] (RA), biventricular Medtronic device, serial number RPS [id number] DFT's were deferred.   There were no immediate post procedure complications. 2.  CXR post procedure demonstrated no pneumothorax status post device implantation.   Brief HPI: Stephen Adams is a 73 y.o. male was is followed out patient.  Recently started on amiodarone 2/2 VT.  Developed CHB, asynchronous symptomatic V pacing.  The patient was recommended he undergo single chamber ICD upgrade to CRT-D with addition of RA/LV lead given CM and LBBB.  Risks, benefits, and alternatives to ICD implantation were reviewed with the patient who wished to proceed.   Hospital Course:  The patient was admitted and underwent device upgrade with details as outlined above. He was monitored on telemetry overnight which demonstrated AV pacing.  Left chest was without hematoma or ecchymosis.  The device check this morning with intact function.  CXR was obtained post procedure and demonstrated no pneumothorax.  Wound care, arm mobility, and restrictions were reviewed  with the patient.  The patient feels well, no CP or SOB, he was examined by Dr. Lovena Le and considered stable for discharge to home.     Physical Exam: Vitals:   01/26/19 0103 01/26/19 0124 01/26/19 0444 01/26/19 0732  BP: 116/80  (!) 118/94 119/86  Pulse: (!) 59  (!) 59 (!) 59  Resp: 18  18 17   Temp: 98.4 F (36.9 C)  97.7 F (36.5 C) 98.6 F (37 C)  TempSrc: Oral  Oral Oral  SpO2: 98%  98% 96%  Weight:  87.9 kg    Height:        GEN- The patient is well appearing, alert and oriented x 3 today.   HEENT: normocephalic, atraumatic; sclera clear, conjunctiva pink; hearing intact Lungs- no hematoma and no increased work of breathing Heart-  RRR, no murmurs, rubs or gallops, PMI not laterally displaced GI- soft, non-tender, non-distended Extremities- no clubbing, cyanosis, or edema MS- no significant deformity or atrophy Skin- warm and dry, no rash or lesion, left  chest without hematoma/ecchymosis Psych- euthymic mood, full affect Neuro- no gross defecits  Labs:   Lab Results  Component Value Date   WBC 4.5 01/20/2019   HGB 15.1 01/20/2019   HCT 46.7 01/20/2019   MCV 95 01/20/2019   PLT 184 01/20/2019    Recent Labs  Lab 01/20/19 1036  NA 136  K 4.9  CL 98  CO2 22  BUN 13  CREATININE 1.04  CALCIUM 9.4  GLUCOSE 104*    Discharge Medications:  Allergies as of 01/26/2019      Reactions   Oxycodone-acetaminophen Nausea Only  Medication List    TAKE these medications   amiodarone 200 MG tablet Commonly known as:  PACERONE Take 1 tablet (200 mg total) by mouth daily. What changed:  when to take this   aspirin EC 81 MG tablet Take 81 mg by mouth daily. Notes to patient:  Resume this evening 01/26/2019   atorvastatin 80 MG tablet Commonly known as:  LIPITOR Take 1 tablet (80 mg total) by mouth daily at 6 PM.   carvedilol 6.25 MG tablet Commonly known as:  Coreg Take 1 tablet (6.25 mg total) by mouth 2 (two) times daily with a meal.   clopidogrel  75 MG tablet Commonly known as:  PLAVIX Take 1 tablet (75 mg total) by mouth daily. Notes to patient:  Do not resume until Monday 01/30/2019   Entresto 97-103 MG Generic drug:  sacubitril-valsartan TAKE 1 TABLET BY MOUTH 2 (TWO) TIMES DAILY What changed:  See the new instructions.   furosemide 20 MG tablet Commonly known as:  LASIX Take 1 tablet (20 mg total) by mouth daily. [place] for #90 days if insurance is better covered   Klor-Con M10 10 MEQ tablet Generic drug:  potassium chloride TAKE 1 TABLET BY MOUTH EVERY DAY What changed:  how much to take       Disposition:  Home  Discharge Instructions    Diet - low sodium heart healthy   Complete by:  As directed    Increase activity slowly   Complete by:  As directed      Follow-up Information    [facility] up.   Specialty:  Cardiology Why:  02/06/2019 @ 9:00AM, wound check visit Contact information: [address]       Evans Lance, MD Follow up.   Specialty:  Cardiology Why:  04/27/2019 @ 1:45PM Contact information: [address] [address] [phone]           Duration of Discharge Encounter: Greater than 30 minutes including physician time.  Venetia Night, PA-C 01/26/2019 9:35 AM  EP Attending  Patient seen and examined. Agree with above. The patient is doing well after upgrade of his VVI ICD to a biv ICD. He has had no arrhythmias overnight. His incision looks good. His CXR demonstrates stable lead position. Interogation of his device under my supervision demonstrates normal device function. He will be discharged home with usual rec's and followup.  Mikle Bosworth.D.

## 2019-01-25 NOTE — H&P (Signed)
HPI [name] returns today for ongoing evaluation and management of his ICD and systolic heart failure. He was in the hospital with VT. 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 He is mildly sob but does not feel bad and has remained fairly active despite pacing at 40. I suspect his cardiac output is poor as his EF is low.      Allergies  Allergen Reactions  . Oxycodone-Acetaminophen Nausea Only           Current Outpatient Medications  Medication Sig Dispense Refill  . amiodarone (PACERONE) 200 MG tablet Take 1 tablet (200 mg total) by mouth 2 (two) times daily. 60 tablet 6  . aspirin 81 MG tablet Take 81 mg by mouth daily.    Marland Kitchen atorvastatin (LIPITOR) 80 MG tablet Take 1 tablet (80 mg total) by mouth daily at 6 PM. 30 tablet 0  . clopidogrel (PLAVIX) 75 MG tablet Take 1 tablet (75 mg total) by mouth daily. 30 tablet 11  . ENTRESTO 97-103 MG TAKE 1 TABLET BY MOUTH 2 (TWO) TIMES DAILY (Patient taking differently: Take 1 tablet by mouth 2 (two) times daily. ) 60 tablet 8  . furosemide (LASIX) 20 MG tablet Take 1 tablet (20 mg total) by mouth daily. [place] for #90 days if insurance is better covered (Patient taking differently: Take 20 mg by mouth daily. ) 30 tablet 12  . potassium chloride (K-DUR,KLOR-CON) 10 MEQ tablet Take 1 tablet (10 mEq total) by mouth daily. 30 tablet 0   No current facility-administered medications for this visit.          Past Medical History:  Diagnosis Date  . CAD (coronary artery disease)    a. 1989 PTCA LAD; b. 1999 BMS to LAD & RCA; c. 2000 PTCA RCA 2/2 ISR; d. 10/1999 Inf MI/VF Arrest: 4 stents to RCA 2/2 ISR; e. 05/2001 Cath: ISR RCA, sev LAD dzs, EF 35-40%-->CABG (details unknown); f. 09/2013 MV: Inferior infarct, no ischemia.  . Chronic systolic CHF (congestive heart failure) ([place])    a. 02/2015 Echo: EF 25-30%.  . DJD (degenerative joint disease)    . Hyperlipidemia   . Hypertensive heart disease   . 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 10/2008 s/p MDT [employer] DR single lead AICD. Ser # [id number] H.  . Ischemic cardiomyopathy    a. 02/2015 Echo: EF 25-30%, inf, apical AK, mid-apical-anteroseptal and ant HK, Gr1 DD, mild AI, mod dil LA.  . Subluxation of interphalangeal joint of lesser toe   . Subluxation of metatarsophalangeal joint of lesser toe     ROS:   All systems reviewed and negative except as noted in the HPI.        Past Surgical History:  Procedure Laterality Date  . CORONARY ARTERY BYPASS GRAFT     4 vessel [employer].  Dr. Burns Spain 06/2000  . CORONARY STENT INTERVENTION N/A 11/25/2018   Procedure: CORONARY STENT INTERVENTION;  Surgeon: Martinique, Peter M, MD;  Location: [facility];  Service: Cardiovascular;  Laterality: N/A;  . EP IMPLANTABLE DEVICE N/A 05/05/2016   Procedure: ICD Generator Changeout;  Surgeon: Evans Lance, MD;  Location: [facility];  Service: Cardiovascular;  Laterality: N/A;  . RIGHT/LEFT HEART CATH AND CORONARY/GRAFT ANGIOGRAPHY N/A 11/23/2018   Procedure: RIGHT/LEFT HEART CATH AND CORONARY/GRAFT ANGIOGRAPHY;  Surgeon: Martinique, Peter M, MD;  Location: [facility];  Service: Cardiovascular;  Laterality: N/A;  . TOTAL KNEE ARTHROPLASTY Left   . TOTAL SHOULDER REPLACEMENT Bilateral           Family History  Problem Relation Age of Onset  . Hypertension Father   . Heart Problems Father        bypass 1992  . CAD Mother   . Pancreatic cancer Mother   . Heart Problems Maternal Grandfather      Social History        Socioeconomic History  . Marital status: Married    Spouse name: Not on file  . Number of children: 4  . Years of education: Not on file  . Highest education level: Not on file  Occupational History  . Occupation: [employer]  . Financial resource strain: Not on  file  . Food insecurity:    Worry: Not on file    Inability: Not on file  . Transportation needs:    Medical: Not on file    Non-medical: Not on file  Tobacco Use  . Smoking status: Never Smoker  . Smokeless tobacco: Never Used  Substance and Sexual Activity  . Alcohol use: Yes    Alcohol/week: 0.0 standard drinks    Comment: occas  . Drug use: No  . Sexual activity: Not on file  Lifestyle  . Physical activity:    Days per week: Not on file    Minutes per session: Not on file  . Stress: Not on file  Relationships  . Social connections:    Talks on phone: Not on file    Gets together: Not on file    Attends religious service: Not on file    Active member of club or organization: Not on file    Attends meetings of clubs or organizations: Not on file    Relationship status: Not on file  . Intimate partner violence:    Fear of current or ex partner: Not on file    Emotionally abused: Not on file    Physically abused: Not on file    Forced sexual activity: Not on file  Other Topics Concern  . 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       BP 112/72   Pulse (!) 40   Ht 5\' 11"  (1.803 m)   Wt 200 lb (90.7 kg)   SpO2 99%   BMI 27.89 kg/m   Physical Exam:  Stable but chroically ill appearing NAD HEENT: Unremarkable Neck:  7 cm JVD, no thyromegally Lymphatics:  No adenopathy Back:  No CVA tenderness Lungs:  Clear with no wheezes HEART:  Regular brady rhythm, no murmurs, no rubs, no clicks Abd:  soft, positive bowel sounds, no organomegally, no rebound, no guarding Ext:  2 plus pulses, no edema, no cyanosis, no clubbing Skin:  No rashes no nodules Neuro:  CN II through XII intact, motor grossly intact  EKG - sinus brady with ventricular pacing  DEVICE  Normal device function.  See PaceArt for details.   Assess/Plan: 1. VT - he has had no recurrent VT on amiodarone.  2. CHB  - he has no conduction today and is paced at 30/min. At baseline he is in NSR but no conduction. We hoped that his AV conduction would improve but no indication at the present time. 3. Chronic systolic heart failure - I discussed upgrade to a biv ICD.  I think that this will benefit him. There is a question as to whether he will need more therapy. I discussed biv upgrade. 4. ICD - his medtronic device is working normally. We will recheck in several months.   Ponciano Ort.  EP Attending  Patient seen and examined. Since my last visit above, the patient has developed CHB, with worsening symptoms. He presents today to undergo upgrade to a Biv ICD. His VT has been under control on amiodarone. I have reviewed the indications/risks/benefits/goals/expectations of the procedure and he wishes to proceed.  Mikle Bosworth.D.

## 2019-01-25 NOTE — Progress Notes (Signed)
Orthopedic Tech Progress Note Patient Details:  Stephen Adams 1946/05/14 [id number] RN said patient has sling Patient ID: Stephen Adams, male   DOB: [birth year], 73 y.o.   MRN: [id number]   Stephen Adams 01/25/2019, 10:44 AM

## 2019-01-26 DIAGNOSIS — I110 Hypertensive heart disease with heart failure: Secondary | ICD-10-CM | POA: Diagnosis not present

## 2019-01-26 DIAGNOSIS — I255 Ischemic cardiomyopathy: Secondary | ICD-10-CM | POA: Diagnosis not present

## 2019-01-26 DIAGNOSIS — I5022 Chronic systolic (congestive) heart failure: Secondary | ICD-10-CM | POA: Diagnosis not present

## 2019-01-26 DIAGNOSIS — R0602 Shortness of breath: Secondary | ICD-10-CM | POA: Diagnosis not present

## 2019-01-26 DIAGNOSIS — I700 Atherosclerosis of aorta: Secondary | ICD-10-CM | POA: Diagnosis not present

## 2019-01-26 DIAGNOSIS — I2510 Atherosclerotic heart disease of native coronary artery without angina pectoris: Secondary | ICD-10-CM | POA: Diagnosis not present

## 2019-01-26 DIAGNOSIS — M1990 Unspecified osteoarthritis, unspecified site: Secondary | ICD-10-CM | POA: Diagnosis not present

## 2019-01-26 DIAGNOSIS — E785 Hyperlipidemia, unspecified: Secondary | ICD-10-CM | POA: Diagnosis not present

## 2019-01-26 DIAGNOSIS — I442 Atrioventricular block, complete: Secondary | ICD-10-CM | POA: Diagnosis not present

## 2019-01-26 DIAGNOSIS — I447 Left bundle-branch block, unspecified: Secondary | ICD-10-CM | POA: Diagnosis not present

## 2019-01-26 DIAGNOSIS — I472 Ventricular tachycardia: Secondary | ICD-10-CM | POA: Diagnosis not present

## 2019-01-26 DIAGNOSIS — Z006 Encounter for examination for normal comparison and control in clinical research program: Secondary | ICD-10-CM | POA: Diagnosis not present

## 2019-01-26 MED ORDER — CARVEDILOL 6.25 MG PO TABS: 6.2500 mg | ORAL_TABLET | Freq: Two times a day (BID) | ORAL | 6 refills | Status: DC

## 2019-01-26 MED FILL — Lidocaine HCl Local Inj 1%: INTRAMUSCULAR | Qty: 60 | Status: AC

## 2019-01-26 NOTE — Progress Notes (Signed)
Patient resting comfortably during shift report. Denies complaints.  

## 2019-01-26 NOTE — Discharge Instructions (Signed)
° °  PLEASE SIGN UP TO YOUR MY CHART ACCOUNT WHEN YOU GET HOME (if you aren't already).  IN THE CURRENT ENVIRONMENT WITH COVID-19, IN EFFORT TO REDUCE YOUR EXPOSURE WE WILL BE CONDUCTING MANY PATIENT VISITS BY EITHER VIRTUAL/VIDEO VISITS or TELEPHONE VISITS.  BEING SIGNED UP IN YOUR MY CHART ACCOUNT  WILL HELP FACILITATE THESE VISITS AND OUR COMMUNICATION WITH YOU      Supplemental Discharge Instructions for  Pacemaker/Defibrillator Patients  Activity No heavy lifting or vigorous activity with your left arm for 6 to 8 weeks.  Do not raise your left arm above your head for one week.  Gradually raise your affected arm as drawn below.              01/29/2019                01/30/2019                01/31/2019               02/01/2019        __  NO DRIVING for 1 week  ; you may begin driving on  01/27/4080   .  WOUND CARE - Keep the wound area clean and dry.  Do not get this area wet for one week. No showers for one week; you may shower on 02/01/2019   . - The tape/steri-strips on your wound will fall off; do not pull them off.  No bandage is needed on the site.  DO  NOT apply any creams, oils, or ointments to the wound area. - If you notice any drainage or discharge from the wound, any swelling or bruising at the site, or you develop a fever > 101? F after you are discharged home, call the office at once.  Special Instructions - You are still able to use cellular telephones; use the ear opposite the side where you have your pacemaker/defibrillator.  Avoid carrying your cellular phone near your device. - When traveling through airports, show security personnel your identification card to avoid being screened in the metal detectors.  Ask the security personnel to use the hand wand. - Avoid arc welding equipment, MRI testing (magnetic resonance imaging), TENS units (transcutaneous nerve stimulators).  Call the office for questions about other devices. - Avoid electrical appliances that are in poor  condition or are not properly grounded. - Microwave ovens are safe to be near or to operate.  Additional information for defibrillator patients should your device go off: - If your device goes off ONCE and you feel fine afterward, notify the device clinic nurses. - If your device goes off ONCE and you do not feel well afterward, call 911. - If your device goes off TWICE, call 911. - If your device goes off THREE times in one day, call 911.  DO NOT DRIVE YOURSELF OR A FAMILY MEMBER WITH A DEFIBRILLATOR TO THE HOSPITAL--CALL 911.

## 2019-01-26 NOTE — Progress Notes (Signed)
Patient ID: Stephen Adams, male   DOB: Oct 16, 1945, 73 y.o.   MRN: [id number] ICD Criteria  Current LVEF:20%. Within 12 months prior to implant: Yes   Heart failure history: Yes, Class III  Cardiomyopathy history: Yes, Ischemic Cardiomyopathy - Prior MI.  Atrial Fibrillation/Atrial Flutter: No.  Ventricular tachycardia history: Yes, Hemodynamic instability present. VT Type: Sustained Ventricular Tachycardia - Monomorphic.  Cardiac arrest history: No.  History of syndromes with risk of sudden death: No.  Previous ICD: Yes, Reason for ICD:  Primary prevention.  Current ICD indication: Secondary  PPM indication: Yes. Pacing type: Both. Greater than 40% RV pacing requirement anticipated. Indication: Complete Heart Block  Class I or II Bradycardia indication present: Yes  Beta Blocker therapy for 3 or more months: No, medical reason.  Ace Inhibitor/ARB therapy for 3 or more months: Yes, prescribed.    I have seen [name] is a 73 y.o. malepre-procedural and has been referred by Dr. Reine Just for consideration of ICD implant for secondary prevention of sudden death.  The patient's chart has been reviewed and they meet criteria for ICD implant.  I have had a thorough discussion with the patient reviewing options.  The patient and their family (if available) have had opportunities to ask questions and have them answered. The patient and I have decided together through the [place] [employer] to proceed with ICD at this time.  Risks, benefits, alternatives to ICD implantation were discussed in detail with the patient today. The patient  understands that the risks include but are not limited to bleeding, infection, pneumothorax, perforation, tamponade, vascular damage, renal failure, MI, stroke, death, inappropriate shocks, and lead dislodgement and he wishes to proceed.

## 2019-01-26 NOTE — Progress Notes (Signed)
Call placed to [facility] to notify of telemetry monitoring d/c.   

## 2019-01-27 ENCOUNTER — Telehealth: Payer: Self-pay | Admitting: Internal Medicine

## 2019-01-27 NOTE — Telephone Encounter (Signed)
New Message:   Pt says he has jumpy feeling on left side where his pacemaker leads are. Pt was just discharged from hospital on 01-26-19, he had an. ICD implant

## 2019-01-27 NOTE — Telephone Encounter (Signed)
Pt with persistent diaphragmatic stim - appt made for Monday to evaluate in clinic  Chanetta Marshall, NP 01/27/2019 2:57 PM

## 2019-01-30 ENCOUNTER — Other Ambulatory Visit: Payer: Self-pay

## 2019-01-30 ENCOUNTER — Ambulatory Visit (INDEPENDENT_AMBULATORY_CARE_PROVIDER_SITE_OTHER): Payer: Medicare Other | Admitting: *Deleted

## 2019-01-30 ENCOUNTER — Encounter (HOSPITAL_COMMUNITY): Payer: Self-pay | Admitting: Internal Medicine

## 2019-01-30 DIAGNOSIS — Z95810 Presence of automatic (implantable) cardiac defibrillator: Secondary | ICD-10-CM

## 2019-01-30 DIAGNOSIS — I255 Ischemic cardiomyopathy: Secondary | ICD-10-CM

## 2019-01-30 DIAGNOSIS — I442 Atrioventricular block, complete: Secondary | ICD-10-CM

## 2019-01-30 LAB — CUP PACEART INCLINIC DEVICE CHECK
Battery Remaining Longevity: 74 mo
Battery Voltage: 3.07 V
Brady Statistic AP VP Percent: 98.32 %
Brady Statistic AP VS Percent: 0.08 %
Brady Statistic AS VP Percent: 1.6 %
Brady Statistic AS VS Percent: 0 %
Brady Statistic RA Percent Paced: 98.35 %
Brady Statistic RV Percent Paced: 99.69 %
Date Time Interrogation Session: 20200420182022
HighPow Impedance: 39 Ohm
HighPow Impedance: 50 Ohm
Implantable Lead Implant Date: 20100104
Implantable Lead Implant Date: 20200415
Implantable Lead Implant Date: 20200415
Implantable Lead Location: 753858
Implantable Lead Location: 753859
Implantable Lead Location: 753860
Implantable Lead Model: 4598
Implantable Lead Model: 5076
Implantable Lead Model: 6947
Implantable Pulse Generator Implant Date: 20200415
Lead Channel Impedance Value: 175.622
Lead Channel Impedance Value: 175.622
Lead Channel Impedance Value: 198.837
Lead Channel Impedance Value: 205.114
Lead Channel Impedance Value: 205.114
Lead Channel Impedance Value: 304 Ohm
Lead Channel Impedance Value: 342 Ohm
Lead Channel Impedance Value: 361 Ohm
Lead Channel Impedance Value: 361 Ohm
Lead Channel Impedance Value: 361 Ohm
Lead Channel Impedance Value: 418 Ohm
Lead Channel Impedance Value: 475 Ohm
Lead Channel Impedance Value: 513 Ohm
Lead Channel Impedance Value: 589 Ohm
Lead Channel Impedance Value: 589 Ohm
Lead Channel Impedance Value: 703 Ohm
Lead Channel Impedance Value: 722 Ohm
Lead Channel Impedance Value: 722 Ohm
Lead Channel Pacing Threshold Amplitude: 0.75 V
Lead Channel Pacing Threshold Amplitude: 1.25 V
Lead Channel Pacing Threshold Amplitude: 1.75 V
Lead Channel Pacing Threshold Pulse Width: 0.4 ms
Lead Channel Pacing Threshold Pulse Width: 0.8 ms
Lead Channel Pacing Threshold Pulse Width: 0.8 ms
Lead Channel Sensing Intrinsic Amplitude: 1.375 mV
Lead Channel Setting Pacing Amplitude: 2.25 V
Lead Channel Setting Pacing Amplitude: 2.5 V
Lead Channel Setting Pacing Amplitude: 3.5 V
Lead Channel Setting Pacing Pulse Width: 0.8 ms
Lead Channel Setting Pacing Pulse Width: 0.8 ms
Lead Channel Setting Sensing Sensitivity: 0.3 mV

## 2019-01-30 NOTE — Progress Notes (Signed)
Device Clinic appointment for diaphragmatic stimulation. Steri-strips intact, instructed pt to remove 02/06/2019. Wound without edema, dressing D & I. Normal device function. Thresholds, sensing, and impedances consistent with implant measurements. Pt reports diaphragmatic STIM, ran vector express, no STIM with LV4 to LV2. Reprogrammed vector to LV4 to LV2 with auto capture on, EGM 3 reprogrammed  to LV4 to RV coil. RA output at 3.5V for extra safety margin until 3 month visit; RV output at chronic settings. Histogram distribution appropriate for patient and level of activity. No mode switches or ventricular arrhythmias noted. RA/RV/LV max pacing impedances at 2000 ohms per protocol. Patient educated about wound care, arm mobility, lifting restrictions, shock plan. ROV in 3 months with implanting physician.  Pt will plan to remove steri-strips and send in a photo of incision on 02/06/19 as full device check was performed today.  Pt reports recent low BPs, BP was 88/60 this AM (3hrs after morning meds). No dizziness or syncope, feeling okay during visit today. Pt reports increased fatigue since starting back on carvedilol 6.25mg  BID. He denies CHF symptoms, reports weight is down 30lbs over the past few months. He reports compliance with a low sodium diet as he hasn't been able to eat out due to [place]. Encouraged pt to continue to monitor BPs. Will route note to Dr. Lovena Le and Dr. Haroldine Laws for review and recommendations.

## 2019-01-30 NOTE — Patient Instructions (Signed)
Remove your Steri-strips on Monday, 02/06/19. Send a picture of your incision to [place] at [email or web address].   Call the [facility] at [phone] if you notice thumping again, or if you notice any signs/symptoms of infection, including drainage, redness, swelling, fever, or chills.

## 2019-01-31 ENCOUNTER — Telehealth: Payer: Self-pay

## 2019-01-31 NOTE — Telephone Encounter (Signed)
-----   Message from Mechele Dawley, RN sent at 01/30/2019  6:30 PM EDT ----- Pt requesting recommendations regarding lower BPs and fatigue since starting back on carvedilol. Thanks!

## 2019-01-31 NOTE — Telephone Encounter (Signed)
Call returned to Pt.  Advised per Dr. Lovena Le to discontinue carvedilol.  Had long discussion on amiodarone and VT.  All Pt questions answered.

## 2019-02-01 DIAGNOSIS — Z1389 Encounter for screening for other disorder: Secondary | ICD-10-CM | POA: Diagnosis not present

## 2019-02-01 DIAGNOSIS — Z95810 Presence of automatic (implantable) cardiac defibrillator: Secondary | ICD-10-CM | POA: Diagnosis not present

## 2019-02-01 DIAGNOSIS — Z79899 Other long term (current) drug therapy: Secondary | ICD-10-CM | POA: Diagnosis not present

## 2019-02-01 DIAGNOSIS — R319 Hematuria, unspecified: Secondary | ICD-10-CM | POA: Diagnosis not present

## 2019-02-01 DIAGNOSIS — Z125 Encounter for screening for malignant neoplasm of prostate: Secondary | ICD-10-CM | POA: Diagnosis not present

## 2019-02-01 DIAGNOSIS — I714 Abdominal aortic aneurysm, without rupture: Secondary | ICD-10-CM | POA: Diagnosis not present

## 2019-02-01 DIAGNOSIS — C439 Malignant melanoma of skin, unspecified: Secondary | ICD-10-CM | POA: Diagnosis not present

## 2019-02-01 DIAGNOSIS — E785 Hyperlipidemia, unspecified: Secondary | ICD-10-CM | POA: Diagnosis not present

## 2019-02-01 DIAGNOSIS — I2510 Atherosclerotic heart disease of native coronary artery without angina pectoris: Secondary | ICD-10-CM | POA: Diagnosis not present

## 2019-02-01 DIAGNOSIS — E559 Vitamin D deficiency, unspecified: Secondary | ICD-10-CM | POA: Diagnosis not present

## 2019-02-01 DIAGNOSIS — I10 Essential (primary) hypertension: Secondary | ICD-10-CM | POA: Diagnosis not present

## 2019-02-01 DIAGNOSIS — Z0000 Encounter for general adult medical examination without abnormal findings: Secondary | ICD-10-CM | POA: Diagnosis not present

## 2019-02-06 ENCOUNTER — Ambulatory Visit: Payer: Medicare Other

## 2019-02-06 ENCOUNTER — Telehealth: Payer: Self-pay | Admitting: Internal Medicine

## 2019-02-06 NOTE — Telephone Encounter (Signed)
Spoke with patient to discuss wound. He reports he thinks it looks good. Advised to send a picture via email, will call back after image received. Pt is agreeable to plan.

## 2019-02-06 NOTE — Telephone Encounter (Signed)
Follow Up:   Pt says he is supposed to send you a picture of his wound. He wants to know how you want him to send it?

## 2019-02-06 NOTE — Telephone Encounter (Signed)
Photo not received.  Called patient. He is going to email photo tomorrow. Reports he is not home right now to take photo. Advised I will call him tomorrow once I receive it. He verbalizes understanding of plan.  Patient also reports he not longer has diaphragmatic stimulation (addressed at recent DC visit) and he is very pleased by this. He thanked me for my call and denies questions at this time.

## 2019-02-07 NOTE — Telephone Encounter (Signed)
Spoke with patient. See photo of incision below. Incision edges appear fully approximated and well healed. Pt denies swelling, drainage, redness, or fever/chills. Discussed wound care and activity/lifting restrictions. Reviewed upcoming appointments. Pt reports he feels much better this week, denies any diaphragmatic stim since reprogramming at visit on 01/30/19. He verbalizes understanding of all instructions and agrees to call back with any questions or concerns.

## 2019-02-23 ENCOUNTER — Telehealth (HOSPITAL_COMMUNITY): Payer: Self-pay | Admitting: *Deleted

## 2019-02-23 NOTE — Telephone Encounter (Signed)
Pt left VM to confirm telehealth visit tomorrow and requested a return call. I called pt back no answer.

## 2019-02-23 NOTE — Progress Notes (Signed)
Heart Failure TeleHealth Note  Due to national recommendations of social distancing due to [place] 19, telehealth visit is felt to be most appropriate for this patient at this time.  I discussed the limitations, risks, security and privacy concerns of performing an evaluation and management service by telephone and the availability of in person appointments. I also discussed with the patient that there may be a patient responsible charge related to this service. The patient expressed understanding and agreed to proceed.   ID:  Stephen Adams, DOB 11-23-1945, MRN [id number]  Location: Home  Provider location: [address], [place] Type of Visit: Established patient   PCP:  Josetta Huddle, MD  Cardiologist:  Minus Breeding, MD Primary HF: Dr Haroldine Laws  Chief Complaint: Heart Failure   History of Present Illness: Stephen Adams is a 73 y.o. male with a history of IMD, chronic systolic heart failure, single chamber ICD (medtronic), CAD, S/P CABG , HTN, hyperlipidemia, and AAA.   Admitted earlier this year with VT and chest pain. Device interrogation showed VT. He was placed on amiodarone and lidocaine drip. He later transitioned to amiodarone 400 mg twice a day. RHC/LHC performed. This showed preserved cardiac output and severe coronary disease/patent grafts, and severe stenosis LCx. On 11/25/18 he underwent PCI/DES to proximal LCx. Recommended to continue DAPT for 12 months.   S/p MDT CRT-D upgrade 01/25/19 with Dr Lovena Le. Attempted to add back BB, but pt had profound fatigue and hypotension, so it was stopped.   Patient presents via audio conferencing for a telehealth visit today. Last visit amiodarone was decreased to 200 mg daily. Overall doing really well. Walking 2 miles every day with no SOB or CP. No edema, orthopnea, or PND. No CP or dizziness. ICD site is healed. No fever/chills. Has lost 35-40 lbs over the last year by dieting and exercising and feels much better. Taking  all medications. Monitors BP, HR, and weight daily. Limits fluid and salt intake. SBP 90-110s.  Pt denies symptoms of cough, fevers, chills, or new SOB worrisome for COVID 19.    ECHO 11/23/18-EF 20-25%, RV normal.  Past Medical History:  Diagnosis Date  . CAD (coronary artery disease)    a. 1989 PTCA LAD; b. 1999 BMS to LAD & RCA; c. 2000 PTCA RCA 2/2 ISR; d. 10/1999 Inf MI/VF Arrest: 4 stents to RCA 2/2 ISR; e. 05/2001 Cath: ISR RCA, sev LAD dzs, EF 35-40%-->CABG (details unknown); f. 09/2013 MV: Inferior infarct, no ischemia.  . Chronic systolic CHF (congestive heart failure) ([place])    a. 02/2015 Echo: EF 25-30%.  . DJD (degenerative joint disease)   . Hyperlipidemia   . Hypertensive heart disease   . 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 10/2008 s/p MDT [address] DR single lead AICD. Ser # [id number].  . Ischemic cardiomyopathy    a. 02/2015 Echo: EF 25-30%, inf, apical AK, mid-apical-anteroseptal and ant HK, Gr1 DD, mild AI, mod dil LA.  . Subluxation of interphalangeal joint of lesser toe   . Subluxation of metatarsophalangeal joint of lesser toe    Past Surgical History:  Procedure Laterality Date  . BIV UPGRADE N/A 01/25/2019   Procedure: BIV UPGRADE;  Surgeon: Evans Lance, MD;  Location: [facility];  Service: Cardiovascular;  Laterality: N/A;  . CORONARY ARTERY BYPASS GRAFT     4 vessel [employer].  Dr. Burns Spain 06/2000  . CORONARY STENT INTERVENTION N/A 11/25/2018   Procedure: CORONARY STENT INTERVENTION;  Surgeon:  Martinique, Peter M, MD;  Location: [facility];  Service: Cardiovascular;  Laterality: N/A;  . EP IMPLANTABLE DEVICE N/A 05/05/2016   Procedure: ICD Generator Changeout;  Surgeon: Evans Lance, MD;  Location: [facility];  Service: Cardiovascular;  Laterality: N/A;  . RIGHT/LEFT HEART CATH AND CORONARY/GRAFT ANGIOGRAPHY N/A 11/23/2018   Procedure: RIGHT/LEFT HEART CATH AND CORONARY/GRAFT ANGIOGRAPHY;  Surgeon: Martinique,  Peter M, MD;  Location: [facility];  Service: Cardiovascular;  Laterality: N/A;  . TOTAL KNEE ARTHROPLASTY Left   . TOTAL SHOULDER REPLACEMENT Bilateral      Current Outpatient Medications  Medication Sig Dispense Refill  . amiodarone (PACERONE) 200 MG tablet Take 1 tablet (200 mg total) by mouth daily. (Patient taking differently: Take 200 mg by mouth every evening. ) 30 tablet 6  . aspirin EC 81 MG tablet Take 81 mg by mouth daily.    Marland Kitchen atorvastatin (LIPITOR) 80 MG tablet Take 1 tablet (80 mg total) by mouth daily at 6 PM. 30 tablet 12  . clopidogrel (PLAVIX) 75 MG tablet Take 1 tablet (75 mg total) by mouth daily. 30 tablet 11  . ENTRESTO 97-103 MG TAKE 1 TABLET BY MOUTH 2 (TWO) TIMES DAILY (Patient taking differently: Take 1 tablet by mouth 2 (two) times daily. ) 60 tablet 8  . furosemide (LASIX) 20 MG tablet Take 1 tablet (20 mg total) by mouth daily. [place] for #90 days if insurance is better covered 30 tablet 12  . KLOR-CON M10 10 MEQ tablet TAKE 1 TABLET BY MOUTH EVERY DAY (Patient taking differently: Take 10 mEq by mouth daily. ) 90 tablet 3   No current facility-administered medications for this encounter.     Allergies:   Oxycodone-acetaminophen   Social History:  The patient  reports that he has never smoked. He has never used smokeless tobacco. He reports current alcohol use. He reports that he does not use drugs.   Family History:  The patient's family history includes CAD in his mother; Heart Problems in his father and maternal grandfather; Hypertension in his father; Pancreatic cancer in his mother.   ROS:  Please see the history of present illness.   All other systems are personally reviewed and negative.    Exam:  (Video/Tele Health Call; Exam is subjective and or/visual.) General:  Speaks in full sentences. No resp difficulty. Left chest ICD site reportedly CDI Lungs: Normal respiratory effort with conversation.  Abdomen: No distension per patient report  Extremities: Pt denies edema. Neuro: Alert & oriented x 3.   Recent Labs: 07/07/2018: BNP 3,214.6 11/22/2018: ALT 19 11/26/2018: Magnesium 1.8 01/20/2019: BUN 13; Creatinine, Ser 1.04; Hemoglobin 15.1; Platelets 184; Potassium 4.9; Sodium 136  Personally reviewed   Wt Readings from Last 3 Encounters:  02/24/19 85.7 kg (189 lb)  01/26/19 87.9 kg (193 lb 12.8 oz)  01/13/19 89.8 kg (198 lb)      ASSESSMENT AND PLAN:  1.  Chronic Systolic Heart Failure, ICM  ECHO 11/2018 EF 20-25%. S/p MDT CRT-D upgrade 01/25/19 with Dr Lovena Le - NYHA II. Volume status sound stable.  - Continue lasix 20 mg daily - Continue Entresto 97/103 mg BID - Attempted restarting BB recently, but was stopped due to profound fatigue and hypotension.  - Last K was 4.9, so will hold off on starting spiro today. DC K supp.  - He needs CPX when we are able to perform for risk stratification. Will set this up today.  - ICD site healing well. See  photo in note from 02/07/19  2. CAD LHC 12/02/18 with severe 3 vessel obstructive CAD. He underwent PCI /DES proximal LCX.  Continue DAPT for 1 year.  - Continue statin, asa, plavix.  - No s/s ischemia  3. H/o VT  Continue amiodarone 200 mg daily. Check LFTs and TSH next OV.  - S/p MDT CRT-D upgrade 01/25/19  4. CHB  - Followed by Dr Lovena Le - S/p MDT CRT-D upgrade 01/25/19   COVID screen The patient does not have any symptoms that suggest any further testing/ screening at this time.  Social distancing reinforced today.  Patient Risk: After full review of this patients clinical status, I feel that they are at moderate risk for cardiac decompensation at this time.  Orders/Follow up: Add to CPX recall list. DC K supp. F/u 2 months with Dr Haroldine Laws.  Today, I have spent 12 minutes with the patient with telehealth technology discussing CHF and medications.    Signed, Georgiana Shore, NP  02/24/2019 10:37 AM   [facility] [address] Heart and  [place] [address] [phone] (office) [phone] (fax)

## 2019-02-24 ENCOUNTER — Ambulatory Visit (HOSPITAL_COMMUNITY)
Admission: RE | Admit: 2019-02-24 | Discharge: 2019-02-24 | Disposition: A | Discharge status: Home / Self Care | Payer: Medicare Other | Source: Ambulatory Visit | Attending: Cardiology | Admitting: Cardiology

## 2019-02-24 ENCOUNTER — Other Ambulatory Visit: Payer: Self-pay

## 2019-02-24 VITALS — BP 114/67 | HR 62 | Wt 189.0 lb

## 2019-02-24 DIAGNOSIS — I2510 Atherosclerotic heart disease of native coronary artery without angina pectoris: Secondary | ICD-10-CM

## 2019-02-24 DIAGNOSIS — I5022 Chronic systolic (congestive) heart failure: Secondary | ICD-10-CM

## 2019-02-24 DIAGNOSIS — I447 Left bundle-branch block, unspecified: Secondary | ICD-10-CM

## 2019-02-24 DIAGNOSIS — I255 Ischemic cardiomyopathy: Secondary | ICD-10-CM

## 2019-02-24 NOTE — Addendum Note (Signed)
Encounter addended by: Valeda Malm, RN on: 02/24/2019 11:02 AM  Actions taken: Diagnosis association updated, Order list changed, Medication long-term status modified, Clinical Note Signed

## 2019-02-24 NOTE — Patient Instructions (Addendum)
STOP Potassium supplements.  Your physician has recommended that you have a cardiopulmonary stress test (CPX). CPX testing is a non-invasive measurement of heart and lung function. It replaces a traditional treadmill stress test. This type of test provides a tremendous amount of information that relates not only to your present condition but also for future outcomes. This test combines measurements of you ventilation, respiratory gas exchange in the lungs, electrocardiogram (EKG), blood pressure and physical response before, during, and following an exercise protocol.  Because of limitations with the Corona Virus, our office cannot schedule CPX test at this time.  As soon as this department opens up, you will be called to schedule.   Your physician recommends that you schedule a follow-up appointment in: 2 months with Dr. Haroldine Laws on Friday, July 24th, 2020 at 12:40p

## 2019-02-24 NOTE — Progress Notes (Signed)
Spoke with patient, discussed AVS, questions answered.  AVS mailed.

## 2019-03-07 ENCOUNTER — Encounter: Payer: Medicare Other | Admitting: Internal Medicine

## 2019-03-27 ENCOUNTER — Telehealth: Payer: Self-pay | Admitting: Cardiology

## 2019-03-27 NOTE — Telephone Encounter (Signed)
Patient is ok with TELE-VISIT/ declined my chart/consent/ pre reg completed

## 2019-03-28 DIAGNOSIS — Z08 Encounter for follow-up examination after completed treatment for malignant neoplasm: Secondary | ICD-10-CM | POA: Diagnosis not present

## 2019-03-28 DIAGNOSIS — Z85820 Personal history of malignant melanoma of skin: Secondary | ICD-10-CM | POA: Diagnosis not present

## 2019-03-28 DIAGNOSIS — S30861A Insect bite (nonvenomous) of abdominal wall, initial encounter: Secondary | ICD-10-CM | POA: Diagnosis not present

## 2019-03-28 DIAGNOSIS — D225 Melanocytic nevi of trunk: Secondary | ICD-10-CM | POA: Diagnosis not present

## 2019-03-28 DIAGNOSIS — Z1283 Encounter for screening for malignant neoplasm of skin: Secondary | ICD-10-CM | POA: Diagnosis not present

## 2019-03-30 NOTE — Progress Notes (Signed)
Virtual Visit via Video Note   This visit type was conducted due to national recommendations for restrictions regarding the COVID-19 Pandemic (e.g. social distancing) in an effort to limit this patient's exposure and mitigate transmission in our community.  Due to his co-morbid illnesses, this patient is at least at moderate risk for complications without adequate follow up.  This format is felt to be most appropriate for this patient at this time.  All issues noted in this document were discussed and addressed.  A limited physical exam was performed with this format.  Please refer to the patient's chart for his consent to telehealth for [facility].   Date:  03/31/2019   ID:  Stephen Adams, DOB Mar 01, 1946, MRN [id number]  Patient Location: Home Provider Location: Home  PCP:  Josetta Huddle, MD  Cardiologist:  Minus Breeding, MD  Electrophysiologist:  None   Evaluation Performed:  Follow-Up Visit  Chief Complaint:  Fatigue   History of Present Illness:    Stephen Adams is a 73 y.o. male for follow up of CAD.  He was in the hospital earlier this year with tachycardia.    He has a history of CAD with multiple remote interventions s/p Inferior MI in 2001 with VF arrest, s/p CABG in 2002, ICD in situ with generator change in 2017, HRrEF EF of 25%, intermittent LBBB, PVC's, AAA, hypertensive heart disease, and HLD   He was started on amiodarone gtt and lidocaine gtt after boluses. He was emergently cardioverted X 1 with 120 j. He returned to paced rhythm at 40 bpm. Troponin was elevated leading to a RHC and LHC on 11/23/2018.   Cath revealed severe 3 vessel CAD with patent LIMA to LAD, patent SVG to large diagonal 2, with 40% stenosis at the graft anastomosis, patent SVG to ramus with an eccentric 50% stenosis in the proximal SVG, patent SVG to RCA with mild diffuse disease was was ectatic. The LCx had severe stenosis in the proximal to mid vessel supplying an OM. Mild to moderate  pulmonary HTN.EF of 15%. Case was initially discussed with the primary regarding PCI of the native LCx (which was felt to be challenging due to acute angulation of the vessel). He subsequently underwent PCI/DES of proximal LCx on 11/25/2018. He was to continue DAPT for 12 months.   He was seen by the advanced heart failure team. His coreg was stopped due to bradycardia with recommendation for CPX . EP with noted AV conduction and narrow QRS at the time of his generator change in 2017. Since then, he had developed significant conduction system disease including LBBB. If was recommended to consider upgraded device (BiV) given his LBBB and severe LV dysfunction, including addition of an atrial lead with the preference being to defer this to follow up.Possibe VT ablation consideration.   On discharge he was transitioned to po amiodarone, 400 mg BID for 7 days and then 200 mg daily thereafter.  Since he was last seen in the office he saw Dr. Lovena Le.  They discussed upgrade to a BiV device which has since been completed.      He reports that he has been somewhat fatigued.  He gets lightheaded occasionally.  Has had some blood pressures in the 81E systolic.  He denies any chest pressure, neck or arm discomfort.  He has had no palpitations, presyncope or syncope.  Has had no PND or orthopnea.  He has had no weight gain or edema.  The patient does not have symptoms concerning for  COVID-19 infection (fever, chills, cough, or new shortness of breath).    Past Medical History:  Diagnosis Date   CAD (coronary artery disease)    a. 1989 PTCA LAD; b. 1999 BMS to LAD & RCA; c. 2000 PTCA RCA 2/2 ISR; d. 10/1999 Inf MI/VF Arrest: 4 stents to RCA 2/2 ISR; e. 05/2001 Cath: ISR RCA, sev LAD dzs, EF 35-40%-->CABG (details unknown); f. 09/2013 MV: Inferior infarct, no ischemia.   Chronic systolic CHF (congestive heart failure) ([place])    a. 02/2015 Echo: EF 25-30%.   DJD (degenerative joint disease)    Hyperlipidemia      Hypertensive heart disease    ICD (implantable cardioverter-defibrillator) battery depletion    a. 10/2008 s/p MDT [address] DR single lead AICD. Ser # [id number] H.   Ischemic cardiomyopathy    a. 02/2015 Echo: EF 25-30%, inf, apical AK, mid-apical-anteroseptal and ant HK, Gr1 DD, mild AI, mod dil LA.   Subluxation of interphalangeal joint of lesser toe    Subluxation of metatarsophalangeal joint of lesser toe    Past Surgical History:  Procedure Laterality Date   BIV UPGRADE N/A 01/25/2019   Procedure: BIV UPGRADE;  Surgeon: Evans Lance, MD;  Location: [facility];  Service: Cardiovascular;  Laterality: N/A;   CORONARY ARTERY BYPASS GRAFT     4 vessel [employer].  Dr. Burns Spain 06/2000   CORONARY STENT INTERVENTION N/A 11/25/2018   Procedure: CORONARY STENT INTERVENTION;  Surgeon: Martinique, Peter M, MD;  Location: [facility];  Service: Cardiovascular;  Laterality: N/A;   EP IMPLANTABLE DEVICE N/A 05/05/2016   Procedure: ICD Generator Changeout;  Surgeon: Evans Lance, MD;  Location: [facility];  Service: Cardiovascular;  Laterality: N/A;   RIGHT/LEFT HEART CATH AND CORONARY/GRAFT ANGIOGRAPHY N/A 11/23/2018   Procedure: RIGHT/LEFT HEART CATH AND CORONARY/GRAFT ANGIOGRAPHY;  Surgeon: Martinique, Peter M, MD;  Location: [facility];  Service: Cardiovascular;  Laterality: N/A;   TOTAL KNEE ARTHROPLASTY Left    TOTAL SHOULDER REPLACEMENT Bilateral      Current Meds  Medication Sig   amiodarone (PACERONE) 200 MG tablet Take 1 tablet (200 mg total) by mouth daily. (Patient taking differently: Take 200 mg by mouth every evening. )   aspirin EC 81 MG tablet Take 81 mg by mouth daily.   atorvastatin (LIPITOR) 80 MG tablet Take 1 tablet (80 mg total) by mouth daily at 6 PM.   clopidogrel (PLAVIX) 75 MG tablet Take 1 tablet (75 mg total) by mouth daily.   ENTRESTO 97-103 MG TAKE 1 TABLET BY MOUTH 2 (TWO) TIMES DAILY (Patient taking differently: Take 1  tablet by mouth 2 (two) times daily. )   furosemide (LASIX) 20 MG tablet Take 1 tablet (20 mg total) by mouth daily. [place] for #90 days if insurance is better covered     Allergies:   Oxycodone-acetaminophen   Social History   Tobacco Use   Smoking status: Never Smoker   Smokeless tobacco: Never Used  Substance Use Topics   Alcohol use: Yes    Alcohol/week: 0.0 standard drinks    Comment: occas   Drug use: No     Family Hx: The patient's family history includes CAD in his mother; Heart Problems in his father and maternal grandfather; Hypertension in his father; Pancreatic cancer in his mother.  ROS:   Please see the history of present illness.    As stated in the HPI and negative for all other systems.   Prior CV  studies:   The following studies were reviewed today:    Labs/Other Tests and Data Reviewed:    EKG:  No ECG reviewed.  Recent Labs: 07/07/2018: BNP 3,214.6 11/22/2018: ALT 19 11/26/2018: Magnesium 1.8 01/20/2019: BUN 13; Creatinine, Ser 1.04; Hemoglobin 15.1; Platelets 184; Potassium 4.9; Sodium 136   Recent Lipid Panel Lab Results  Component Value Date/Time   CHOL 119 04/26/2018 09:19 AM   TRIG 52 04/26/2018 09:19 AM   HDL 28 (L) 04/26/2018 09:19 AM   CHOLHDL 4.3 04/26/2018 09:19 AM   LDLCALC 81 04/26/2018 09:19 AM    Wt Readings from Last 3 Encounters:  03/31/19 187 lb 8 oz (85 kg)  02/24/19 189 lb (85.7 kg)  01/26/19 193 lb 12.8 oz (87.9 kg)     Objective:    Vital Signs:  BP 117/80    Pulse 62    Ht 5\' 11"  (1.803 m)    Wt 187 lb 8 oz (85 kg)    BMI 26.15 kg/m    VITAL SIGNS:  reviewed GEN:  no acute distress EYES:  sclerae anicteric, EOMI - Extraocular Movements Intact NEURO:  alert and oriented x 3, no obvious focal deficit PSYCH:  normal affect  ASSESSMENT & PLAN:    CAD:    The patient has no new sypmtoms.  No further cardiovascular testing is indicated.  We will continue with aggressive risk reduction and meds as  listed.  Ischemic Cardiomyopathy:       He would not tolerate med titration.  However, he does agree to continue to try this dose of Entresto but if he is continued to have any lightheadedness with lower blood pressures he might need to back off.  I will see him in the office in 2 months and we will make this decision.   HRrEF:   We talked about reducing his Lasix which he might need only as needed.  We talked about salt and fluid restriction.  At this point he cannot tolerate med titration I am can hold off on restarting carvedilol because of his lower blood pressures.   ICD :    I reviewed his device interrogation.  He is up-to-date.  For now I am going to continue his amiodarone since he presented with ventricular tachycardia.  Like you to reduce this dose in the future.  COVID-19 Education: The signs and symptoms of COVID-19 were discussed with the patient and how to seek care for testing (follow up with PCP or arrange E-visit).  The importance of social distancing was discussed today.  Time:   Today, I have spent 26 minutes with the patient with telehealth technology discussing the above problems.     Medication Adjustments/Labs and Tests Ordered: Current medicines are reviewed at length with the patient today.  Concerns regarding medicines are outlined above.   Tests Ordered: No orders of the defined types were placed in this encounter.   Medication Changes: No orders of the defined types were placed in this encounter.   Follow Up:  In Person in two months  Signed, Minus Breeding, MD  03/31/2019 9:21 AM    [place]

## 2019-03-31 ENCOUNTER — Telehealth (INDEPENDENT_AMBULATORY_CARE_PROVIDER_SITE_OTHER): Payer: Medicare Other | Admitting: Cardiology

## 2019-03-31 ENCOUNTER — Encounter: Payer: Self-pay | Admitting: Cardiology

## 2019-03-31 VITALS — BP 117/80 | HR 62 | Ht 71.0 in | Wt 187.5 lb

## 2019-03-31 DIAGNOSIS — I472 Ventricular tachycardia, unspecified: Secondary | ICD-10-CM

## 2019-03-31 DIAGNOSIS — I2510 Atherosclerotic heart disease of native coronary artery without angina pectoris: Secondary | ICD-10-CM | POA: Diagnosis not present

## 2019-03-31 DIAGNOSIS — Z7189 Other specified counseling: Secondary | ICD-10-CM

## 2019-03-31 DIAGNOSIS — I442 Atrioventricular block, complete: Secondary | ICD-10-CM

## 2019-03-31 MED ORDER — ENTRESTO 97-103 MG PO TABS: 1.0000 | ORAL_TABLET | Freq: Two times a day (BID) | ORAL | 5 refills | Status: AC

## 2019-03-31 NOTE — Patient Instructions (Signed)
Medication Instructions:  Your physician recommends that you continue on your current medications as directed. Please refer to the Current Medication list given to you today.  If you need a refill on your cardiac medications before your next appointment, please call your pharmacy.   Lab work: NONE  Testing/Procedures: NONE  Follow-Up: Your physician recommends that you schedule a follow-up appointment in: [address] [place]

## 2019-04-06 DIAGNOSIS — S50862A Insect bite (nonvenomous) of left forearm, initial encounter: Secondary | ICD-10-CM | POA: Diagnosis not present

## 2019-04-06 DIAGNOSIS — S20462A Insect bite (nonvenomous) of left back wall of thorax, initial encounter: Secondary | ICD-10-CM | POA: Diagnosis not present

## 2019-04-25 ENCOUNTER — Ambulatory Visit (INDEPENDENT_AMBULATORY_CARE_PROVIDER_SITE_OTHER): Payer: Medicare Other | Admitting: *Deleted

## 2019-04-25 DIAGNOSIS — I255 Ischemic cardiomyopathy: Secondary | ICD-10-CM

## 2019-04-25 DIAGNOSIS — I442 Atrioventricular block, complete: Secondary | ICD-10-CM

## 2019-04-26 ENCOUNTER — Telehealth: Payer: Self-pay

## 2019-04-26 LAB — CUP PACEART REMOTE DEVICE CHECK
Battery Remaining Longevity: 66 mo
Battery Voltage: 3.02 V
Brady Statistic AP VP Percent: 90.47 %
Brady Statistic AP VS Percent: 0.34 %
Brady Statistic AS VP Percent: 9.1 %
Brady Statistic AS VS Percent: 0.09 %
Brady Statistic RA Percent Paced: 90.21 %
Brady Statistic RV Percent Paced: 98.73 %
Date Time Interrogation Session: 20200715143333
HighPow Impedance: 40 Ohm
HighPow Impedance: 49 Ohm
Implantable Lead Implant Date: 20100104
Implantable Lead Implant Date: 20200415
Implantable Lead Implant Date: 20200415
Implantable Lead Location: 753858
Implantable Lead Location: 753859
Implantable Lead Location: 753860
Implantable Lead Model: 4598
Implantable Lead Model: 5076
Implantable Lead Model: 6947
Implantable Pulse Generator Implant Date: 20200415
Lead Channel Impedance Value: 209 Ohm
Lead Channel Impedance Value: 222.34 Ohm
Lead Channel Impedance Value: 247.704
Lead Channel Impedance Value: 247.704
Lead Channel Impedance Value: 266.667
Lead Channel Impedance Value: 285 Ohm
Lead Channel Impedance Value: 361 Ohm
Lead Channel Impedance Value: 418 Ohm
Lead Channel Impedance Value: 418 Ohm
Lead Channel Impedance Value: 456 Ohm
Lead Channel Impedance Value: 475 Ohm
Lead Channel Impedance Value: 532 Ohm
Lead Channel Impedance Value: 608 Ohm
Lead Channel Impedance Value: 703 Ohm
Lead Channel Impedance Value: 779 Ohm
Lead Channel Impedance Value: 893 Ohm
Lead Channel Impedance Value: 931 Ohm
Lead Channel Impedance Value: 950 Ohm
Lead Channel Pacing Threshold Amplitude: 0.75 V
Lead Channel Pacing Threshold Amplitude: 0.875 V
Lead Channel Pacing Threshold Amplitude: 1.875 V
Lead Channel Pacing Threshold Pulse Width: 0.4 ms
Lead Channel Pacing Threshold Pulse Width: 0.4 ms
Lead Channel Pacing Threshold Pulse Width: 0.8 ms
Lead Channel Sensing Intrinsic Amplitude: 0.875 mV
Lead Channel Sensing Intrinsic Amplitude: 0.875 mV
Lead Channel Setting Pacing Amplitude: 1.25 V
Lead Channel Setting Pacing Amplitude: 2.5 V
Lead Channel Setting Pacing Amplitude: 3.5 V
Lead Channel Setting Pacing Pulse Width: 0.8 ms
Lead Channel Setting Pacing Pulse Width: 0.8 ms
Lead Channel Setting Sensing Sensitivity: 0.3 mV

## 2019-04-26 NOTE — Telephone Encounter (Signed)
Spoke with patient to remind of missed remote transmission 

## 2019-04-27 ENCOUNTER — Encounter: Payer: Medicare Other | Admitting: Internal Medicine

## 2019-05-05 ENCOUNTER — Encounter (HOSPITAL_COMMUNITY): Payer: Medicare Other | Admitting: Internal Medicine

## 2019-05-11 NOTE — Progress Notes (Signed)
Remote ICD transmission.   

## 2019-05-11 NOTE — Addendum Note (Signed)
Addended by: Douglass Rivers D on: 05/11/2019 02:14 PM   Modules accepted: Level of Service

## 2019-05-19 DIAGNOSIS — I2510 Atherosclerotic heart disease of native coronary artery without angina pectoris: Secondary | ICD-10-CM | POA: Diagnosis not present

## 2019-05-19 DIAGNOSIS — I255 Ischemic cardiomyopathy: Secondary | ICD-10-CM | POA: Diagnosis not present

## 2019-05-19 DIAGNOSIS — E785 Hyperlipidemia, unspecified: Secondary | ICD-10-CM | POA: Diagnosis not present

## 2019-05-19 DIAGNOSIS — Z95818 Presence of other cardiac implants and grafts: Secondary | ICD-10-CM | POA: Diagnosis not present

## 2019-05-24 DIAGNOSIS — Z4502 Encounter for adjustment and management of automatic implantable cardiac defibrillator: Secondary | ICD-10-CM | POA: Diagnosis not present

## 2019-06-08 ENCOUNTER — Telehealth: Payer: Self-pay

## 2019-06-08 NOTE — Telephone Encounter (Signed)
I spoke to the pt and he states he wants to be release to [facility] in [place]. He moved back to [place] to be near his kids.

## 2019-06-14 ENCOUNTER — Telehealth (HOSPITAL_COMMUNITY): Payer: Self-pay | Admitting: *Deleted

## 2019-06-14 NOTE — Telephone Encounter (Signed)
Contacted patient from CPX wait list to schedule. Patient stated he has moved to [place] and is planning to continue follow-up there and will not need CPX any further through [facility]. Will remove patient from wait list.    Stephen Martins, MS, ACSM-RCEP Clinical Exercise Physiologist

## 2019-06-16 ENCOUNTER — Ambulatory Visit: Payer: Medicare Other | Admitting: Cardiology

## 2019-07-10 DIAGNOSIS — E785 Hyperlipidemia, unspecified: Secondary | ICD-10-CM | POA: Diagnosis not present

## 2019-07-10 DIAGNOSIS — Z0000 Encounter for general adult medical examination without abnormal findings: Secondary | ICD-10-CM | POA: Diagnosis not present

## 2019-07-10 DIAGNOSIS — I10 Essential (primary) hypertension: Secondary | ICD-10-CM | POA: Diagnosis not present

## 2019-07-10 DIAGNOSIS — Z125 Encounter for screening for malignant neoplasm of prostate: Secondary | ICD-10-CM | POA: Diagnosis not present

## 2019-07-10 DIAGNOSIS — Z23 Encounter for immunization: Secondary | ICD-10-CM | POA: Diagnosis not present

## 2019-07-10 DIAGNOSIS — Z85820 Personal history of malignant melanoma of skin: Secondary | ICD-10-CM | POA: Diagnosis not present

## 2019-07-10 DIAGNOSIS — Z1211 Encounter for screening for malignant neoplasm of colon: Secondary | ICD-10-CM | POA: Diagnosis not present

## 2019-07-25 DIAGNOSIS — D225 Melanocytic nevi of trunk: Secondary | ICD-10-CM | POA: Diagnosis not present

## 2019-07-26 ENCOUNTER — Encounter: Payer: Medicare Other | Admitting: *Deleted

## 2019-08-17 DIAGNOSIS — D225 Melanocytic nevi of trunk: Secondary | ICD-10-CM | POA: Diagnosis not present

## 2019-08-17 DIAGNOSIS — D485 Neoplasm of uncertain behavior of skin: Secondary | ICD-10-CM | POA: Diagnosis not present

## 2019-08-23 DIAGNOSIS — H35033 Hypertensive retinopathy, bilateral: Secondary | ICD-10-CM | POA: Diagnosis not present

## 2019-08-23 DIAGNOSIS — H2511 Age-related nuclear cataract, right eye: Secondary | ICD-10-CM | POA: Diagnosis not present

## 2019-08-23 DIAGNOSIS — H25812 Combined forms of age-related cataract, left eye: Secondary | ICD-10-CM | POA: Diagnosis not present

## 2019-08-28 DIAGNOSIS — Z4502 Encounter for adjustment and management of automatic implantable cardiac defibrillator: Secondary | ICD-10-CM | POA: Diagnosis not present

## 2019-08-28 DIAGNOSIS — G4733 Obstructive sleep apnea (adult) (pediatric): Secondary | ICD-10-CM | POA: Diagnosis not present

## 2019-08-30 DIAGNOSIS — Z4509 Encounter for adjustment and management of other cardiac device: Secondary | ICD-10-CM | POA: Diagnosis not present

## 2019-08-31 DIAGNOSIS — Z4802 Encounter for removal of sutures: Secondary | ICD-10-CM | POA: Diagnosis not present

## 2019-08-31 DIAGNOSIS — Z09 Encounter for follow-up examination after completed treatment for conditions other than malignant neoplasm: Secondary | ICD-10-CM | POA: Diagnosis not present

## 2019-08-31 DIAGNOSIS — Z4502 Encounter for adjustment and management of automatic implantable cardiac defibrillator: Secondary | ICD-10-CM | POA: Diagnosis not present

## 2019-11-20 DIAGNOSIS — Z1211 Encounter for screening for malignant neoplasm of colon: Secondary | ICD-10-CM | POA: Diagnosis not present

## 2019-11-27 DIAGNOSIS — L814 Other melanin hyperpigmentation: Secondary | ICD-10-CM | POA: Diagnosis not present

## 2019-11-27 DIAGNOSIS — Z85820 Personal history of malignant melanoma of skin: Secondary | ICD-10-CM | POA: Diagnosis not present

## 2019-11-27 DIAGNOSIS — Z7189 Other specified counseling: Secondary | ICD-10-CM | POA: Diagnosis not present

## 2019-11-27 DIAGNOSIS — D0462 Carcinoma in situ of skin of left upper limb, including shoulder: Secondary | ICD-10-CM | POA: Diagnosis not present

## 2019-11-27 DIAGNOSIS — D2261 Melanocytic nevi of right upper limb, including shoulder: Secondary | ICD-10-CM | POA: Diagnosis not present

## 2019-11-27 DIAGNOSIS — L821 Other seborrheic keratosis: Secondary | ICD-10-CM | POA: Diagnosis not present

## 2019-11-27 DIAGNOSIS — L905 Scar conditions and fibrosis of skin: Secondary | ICD-10-CM | POA: Diagnosis not present

## 2019-11-27 DIAGNOSIS — D485 Neoplasm of uncertain behavior of skin: Secondary | ICD-10-CM | POA: Diagnosis not present

## 2019-11-27 DIAGNOSIS — Z08 Encounter for follow-up examination after completed treatment for malignant neoplasm: Secondary | ICD-10-CM | POA: Diagnosis not present

## 2019-11-27 DIAGNOSIS — D225 Melanocytic nevi of trunk: Secondary | ICD-10-CM | POA: Diagnosis not present

## 2019-11-27 DIAGNOSIS — D2262 Melanocytic nevi of left upper limb, including shoulder: Secondary | ICD-10-CM | POA: Diagnosis not present

## 2019-12-08 DIAGNOSIS — E7800 Pure hypercholesterolemia, unspecified: Secondary | ICD-10-CM | POA: Diagnosis not present

## 2019-12-08 DIAGNOSIS — I2510 Atherosclerotic heart disease of native coronary artery without angina pectoris: Secondary | ICD-10-CM | POA: Diagnosis not present

## 2019-12-08 DIAGNOSIS — G4733 Obstructive sleep apnea (adult) (pediatric): Secondary | ICD-10-CM | POA: Diagnosis not present

## 2019-12-08 DIAGNOSIS — I447 Left bundle-branch block, unspecified: Secondary | ICD-10-CM | POA: Diagnosis not present

## 2019-12-08 DIAGNOSIS — I255 Ischemic cardiomyopathy: Secondary | ICD-10-CM | POA: Diagnosis not present

## 2019-12-08 DIAGNOSIS — I959 Hypotension, unspecified: Secondary | ICD-10-CM | POA: Diagnosis not present

## 2019-12-08 DIAGNOSIS — I429 Cardiomyopathy, unspecified: Secondary | ICD-10-CM | POA: Diagnosis not present

## 2019-12-08 DIAGNOSIS — E785 Hyperlipidemia, unspecified: Secondary | ICD-10-CM | POA: Diagnosis not present

## 2019-12-11 DIAGNOSIS — Z4502 Encounter for adjustment and management of automatic implantable cardiac defibrillator: Secondary | ICD-10-CM | POA: Diagnosis not present

## 2019-12-12 DIAGNOSIS — Z4509 Encounter for adjustment and management of other cardiac device: Secondary | ICD-10-CM | POA: Diagnosis not present

## 2019-12-13 DIAGNOSIS — Z4502 Encounter for adjustment and management of automatic implantable cardiac defibrillator: Secondary | ICD-10-CM | POA: Diagnosis not present

## 2020-01-01 DIAGNOSIS — R238 Other skin changes: Secondary | ICD-10-CM | POA: Diagnosis not present

## 2020-01-01 DIAGNOSIS — Z8603 Personal history of neoplasm of uncertain behavior: Secondary | ICD-10-CM | POA: Diagnosis not present

## 2020-01-01 DIAGNOSIS — D485 Neoplasm of uncertain behavior of skin: Secondary | ICD-10-CM | POA: Diagnosis not present

## 2020-01-03 DIAGNOSIS — Z23 Encounter for immunization: Secondary | ICD-10-CM | POA: Diagnosis not present

## 2020-01-06 DIAGNOSIS — N5082 Scrotal pain: Secondary | ICD-10-CM | POA: Diagnosis not present

## 2020-01-06 DIAGNOSIS — N433 Hydrocele, unspecified: Secondary | ICD-10-CM | POA: Diagnosis not present

## 2020-01-06 DIAGNOSIS — R1031 Right lower quadrant pain: Secondary | ICD-10-CM | POA: Diagnosis not present

## 2020-01-06 DIAGNOSIS — R109 Unspecified abdominal pain: Secondary | ICD-10-CM | POA: Diagnosis not present

## 2020-01-15 DIAGNOSIS — D0462 Carcinoma in situ of skin of left upper limb, including shoulder: Secondary | ICD-10-CM | POA: Diagnosis not present
